# Patient Record
Sex: Female | Born: 1945 | Race: Black or African American | Hispanic: No | Smoking: Former smoker
Health system: Southern US, Community
[De-identification: ages and names within clinical notes are randomized; demographics above are authoritative.]

## PROBLEM LIST (undated history)

## (undated) DIAGNOSIS — J45909 Unspecified asthma, uncomplicated: Secondary | ICD-10-CM

## (undated) DIAGNOSIS — C50919 Malignant neoplasm of unspecified site of unspecified female breast: Secondary | ICD-10-CM

## (undated) DIAGNOSIS — E781 Pure hyperglyceridemia: Secondary | ICD-10-CM

## (undated) DIAGNOSIS — G43909 Migraine, unspecified, not intractable, without status migrainosus: Secondary | ICD-10-CM

## (undated) DIAGNOSIS — M1990 Unspecified osteoarthritis, unspecified site: Secondary | ICD-10-CM

## (undated) DIAGNOSIS — I10 Essential (primary) hypertension: Secondary | ICD-10-CM

## (undated) DIAGNOSIS — F419 Anxiety disorder, unspecified: Secondary | ICD-10-CM

## (undated) DIAGNOSIS — F329 Major depressive disorder, single episode, unspecified: Secondary | ICD-10-CM

## (undated) DIAGNOSIS — E119 Type 2 diabetes mellitus without complications: Secondary | ICD-10-CM

## (undated) DIAGNOSIS — F0390 Unspecified dementia without behavioral disturbance: Secondary | ICD-10-CM

## (undated) DIAGNOSIS — T7840XA Allergy, unspecified, initial encounter: Secondary | ICD-10-CM

## (undated) DIAGNOSIS — F32A Depression, unspecified: Secondary | ICD-10-CM

## (undated) HISTORY — PX: ROTATOR CUFF REPAIR: SHX139

## (undated) HISTORY — PX: JOINT REPLACEMENT: SHX530

## (undated) HISTORY — DX: Essential (primary) hypertension: I10

## (undated) HISTORY — DX: Unspecified asthma, uncomplicated: J45.909

## (undated) HISTORY — PX: COLONOSCOPY: SHX174

## (undated) HISTORY — DX: Allergy, unspecified, initial encounter: T78.40XA

## (undated) HISTORY — DX: Unspecified osteoarthritis, unspecified site: M19.90

## (undated) HISTORY — PX: TOTAL HIP ARTHROPLASTY: SHX124

## (undated) HISTORY — DX: Major depressive disorder, single episode, unspecified: F32.9

## (undated) HISTORY — DX: Pure hyperglyceridemia: E78.1

## (undated) HISTORY — PX: MASTECTOMY COMPLETE / SIMPLE W/ SENTINEL NODE BIOPSY: SUR846

## (undated) HISTORY — DX: Depression, unspecified: F32.A

## (undated) HISTORY — PX: BREAST SURGERY: SHX581

---
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
## 1971-07-06 HISTORY — PX: TUBAL LIGATION: SHX77

## 1979-03-06 DIAGNOSIS — G43909 Migraine, unspecified, not intractable, without status migrainosus: Secondary | ICD-10-CM

## 1979-03-06 HISTORY — DX: Migraine, unspecified, not intractable, without status migrainosus: G43.909

## 1979-07-06 HISTORY — PX: ABDOMINAL HYSTERECTOMY: SHX81

## 1998-06-12 ENCOUNTER — Other Ambulatory Visit: Admission: RE | Admit: 1998-06-12 | Discharge: 1998-06-12 | Payer: Self-pay | Admitting: Gynecology

## 1999-12-06 ENCOUNTER — Emergency Department (HOSPITAL_COMMUNITY): Admission: EM | Admit: 1999-12-06 | Discharge: 1999-12-06 | Payer: Self-pay | Admitting: Emergency Medicine

## 1999-12-06 ENCOUNTER — Encounter: Payer: Self-pay | Admitting: Emergency Medicine

## 2000-03-09 ENCOUNTER — Ambulatory Visit (HOSPITAL_BASED_OUTPATIENT_CLINIC_OR_DEPARTMENT_OTHER): Admission: RE | Admit: 2000-03-09 | Discharge: 2000-03-09 | Payer: Self-pay | Admitting: Orthopedic Surgery

## 2000-03-15 HISTORY — PX: ANKLE FRACTURE SURGERY: SHX122

## 2000-10-20 ENCOUNTER — Other Ambulatory Visit: Admission: RE | Admit: 2000-10-20 | Discharge: 2000-10-20 | Payer: Self-pay | Admitting: General Surgery

## 2000-10-20 ENCOUNTER — Encounter: Admission: RE | Admit: 2000-10-20 | Discharge: 2000-10-20 | Payer: Self-pay | Admitting: General Surgery

## 2000-10-20 ENCOUNTER — Encounter: Payer: Self-pay | Admitting: General Surgery

## 2000-10-20 ENCOUNTER — Encounter (INDEPENDENT_AMBULATORY_CARE_PROVIDER_SITE_OTHER): Payer: Self-pay | Admitting: Specialist

## 2001-10-08 ENCOUNTER — Emergency Department (HOSPITAL_COMMUNITY): Admission: EM | Admit: 2001-10-08 | Discharge: 2001-10-08 | Payer: Self-pay | Admitting: Emergency Medicine

## 2001-10-08 ENCOUNTER — Encounter: Payer: Self-pay | Admitting: Emergency Medicine

## 2003-03-15 ENCOUNTER — Inpatient Hospital Stay (HOSPITAL_COMMUNITY): Admission: EM | Admit: 2003-03-15 | Discharge: 2003-03-17 | Payer: Self-pay | Admitting: Emergency Medicine

## 2003-03-15 ENCOUNTER — Encounter: Payer: Self-pay | Admitting: Emergency Medicine

## 2003-03-27 ENCOUNTER — Encounter: Admission: RE | Admit: 2003-03-27 | Discharge: 2003-03-27 | Payer: Self-pay | Admitting: Family Medicine

## 2006-11-01 ENCOUNTER — Inpatient Hospital Stay (HOSPITAL_COMMUNITY): Admission: RE | Admit: 2006-11-01 | Discharge: 2006-11-05 | Payer: Self-pay | Admitting: Orthopedic Surgery

## 2007-03-14 ENCOUNTER — Ambulatory Visit: Payer: Self-pay | Admitting: Gastroenterology

## 2007-03-27 ENCOUNTER — Encounter: Payer: Self-pay | Admitting: Gastroenterology

## 2007-03-27 ENCOUNTER — Ambulatory Visit: Payer: Self-pay | Admitting: Gastroenterology

## 2007-08-18 ENCOUNTER — Inpatient Hospital Stay (HOSPITAL_COMMUNITY): Admission: RE | Admit: 2007-08-18 | Discharge: 2007-08-21 | Payer: Self-pay | Admitting: Orthopedic Surgery

## 2010-11-17 NOTE — H&P (Signed)
[name], [name]              ACCOUNT NO.:  [id number]   MEDICAL RECORD NO.:  [id number]          PATIENT TYPE:  INP   LOCATION:  NA                           FACILITY:  [facility]   PHYSICIAN:  Madlyn Frankel. Charlann Boxer, M.D.  DATE OF BIRTH:  [birth year]   DATE OF ADMISSION:  DATE OF DISCHARGE:                              HISTORY & PHYSICAL   CHIEF COMPLAINTS:  Right hip and groin pain.   HISTORY OF PRESENT ILLNESS:  A 65 year old female with a history of  right hip pain secondary to avascular necrosis with a history of left  total hip placement in April 2008.  She progressed nicely with a left  total hip replacement.  Her right hip has been refractory to all  conservative treatment including anti-inflammatories and intra-articular  injection.  She has been presurgically assessed previously by Dr.  Merla Riches for joint replacement surgery.   PAST MEDICAL HISTORY:  1. Degenerative joint disease with avascular necrosis.  2. Diabetes.  3. Hypertension.  4. Depression.  5. Asthma.   SOCIAL HISTORY:  The patient is single.  Primary caregiver will be  family.   FAMILY HISTORY:  Hypertension, diabetes, stroke, arthritis.   DRUG ALLERGIES:  No known drug allergies.   MEDICATIONS:  1. Glucophage 500 mg XR one p.o. daily.  2. Atenolol 100 mg one p.o. daily.  3. Amlodipine 10 mg one p.o. daily.  4. HCTZ 12.5 mg one p.o. daily.  5. Wellbutrin XL 300 mg one p.o. daily.  6. Xanax 0.5 mg one p.o. q.6 p.r.n.  7. Albuterol p.r.n. for asthma.   REVIEW OF SYSTEMS:  RESPIRATORY:  She has asthma and uses albuterol  rescue inhaler.  Otherwise see HPI.   PHYSICAL EXAMINATION:  Pulse 88, respirations 18, blood pressure 138/94.  GENERAL:  Awake, alert and oriented, well-developed, well-nourished, no  acute distress.  NECK:  Supple.  No carotid bruits.  CHEST:  She does have inspiratory wheeze upper lobes. Equal chest rise  and fall.  BREASTS:  Deferred.  HEART:  Regular rate and rhythm.  S1, S2  distinct.  ABDOMEN:  Soft, nontender, nondistended.  Bowel sounds present.  GENITOURINARY:  Deferred.  EXTREMITIES:  Right hip increased pain with internal range of motion.  SKIN:  No cellulitis right hip.  NEUROLOGIC:  Intact distal sensibilities.   LABS:  EKG, chest x-ray pending presurgical testing.   IMPRESSION:  Right hip avascular necrosis.   PLAN OF ACTION:  Right total hip arthroplasty August 18, 2007 [facility] by surgeon Dr. Durene Romans.  The risks and complications  were discussed.  Questions were encouraged answered and reviewed.   Postoperative medications including Lovenox, Robaxin, iron, aspirin,  Colace, MiraLax were provided at the time of history and physical.  Pain  medicine will be provided at the time of surgery.  Planned discharge is  home health care, PT and Lovenox teaching.     ______________________________  Yetta Glassman. [address], [place]      Madlyn Frankel. Charlann Boxer, M.D.  Electronically Signed    BLM/MEDQ  D:  08/10/2007  T:  08/11/2007  Job:  [id number]

## 2010-11-17 NOTE — Op Note (Signed)
[name], [name]              ACCOUNT NO.:  [id number]   MEDICAL RECORD NO.:  [id number]          PATIENT TYPE:  INP   LOCATION:  1608                         FACILITY:  [facility]   PHYSICIAN:  Madlyn Frankel. Charlann Boxer, M.D.  DATE OF BIRTH:  Apr 17, 1946   DATE OF PROCEDURE:  08/18/2007  DATE OF DISCHARGE:                               OPERATIVE REPORT   PREOPERATIVE DIAGNOSIS:  Right hip osteoarthritis.   POSTOPERATIVE DIAGNOSIS:  Right hip osteoarthritis.   PROCEDURE:  Right total hip replacement.   COMPONENTS USED:  DePuy hip system, size 50 Pinnacle cup, with a 36  neutral metal liner, a size 4 high-offset Trilock stem, with a 36 +1.5  ball.   SURGEON:  Madlyn Frankel. Charlann Boxer, M.D.   ASSISTANT:  Yetta Glassman. Mann, PA.   ANESTHESIA:  General.   BLOOD LOSS:  250.   DRAINS:  x1.   COMPLICATIONS:  None.   INDICATIONS FOR PROCEDURE:  [name] is a pleasant 65 year old patient  of mine with a history of left total hip replacement, who came in with  progressive right hip pain, with radiographic findings of end-stage  change.  She had progressive discomfort, and based on response to the  left total hip replacement she wished to proceed with right hip surgery.  I reviewed the risks and benefits with her, so as obtaining consent.   PROCEDURE IN DETAIL:  The patient was brought to the operative theater.  Once adequate anesthesia and preoperative antibiotics, Ancef, were  administered, the patient was positioned in the left lateral decubitus  position with the right side up.  Prior to positioning, in the supine  position I evaluated her leg lengths, feeling and assessing that the  left lower extremity, the previously operated hip, was about 1 cm  longer.  I made noted of this when the patient was positioned in the  left lateral decubitus position and determined the leg length position  based on the position of the patella down to the down leg.  At this  point, we prepped out the perineum,  then prescrubbed and prepped out the  hip in the normal standard fashion.  A lateral-based incision was made  for a posterior approach to the hip.  The iliotibial band and gluteus  fascia were incised posteriorly.  The short external rotators were  identified and taken down separately from the posterior capsule.  An L  capsulotomy was made, preserving the posterior superior leaflet for  later anatomical repair and utilizing the posterior leaflet to protect  the sciatic nerve from retractors.  The hip was then dislocated, and the  neck osteotomy made based off anatomic landmarks.   I then began broaching the femur.  I used a box osteotome and then the  drill to open up the canal, then did a hand reamer and irrigated the  canal to prevent fat emboli.  I then began broaching and carried it  initially up to a size 3 to the level the neck cut, planing it off with  the calcar planer.  At this point, I attended to the acetabulum, with  acetabular  exposure routine, including labrectomy.  I began reaming with  a 43 reamer.  I reamed up to a 49 reamer, with good punctate bleeding  bone in the acetabulum.  I thus impacted a 15 mm Pinnacle cup.  This sat  well within the prepared bone.  A single cancellous screw was placed.  Cup was positioned a 35-40 degrees of abduction, 20 degrees of forward  flexion.  Trial reduction was then carried out initially with a size 3  femur and a 1.5 ball.  Based on the amount of shuck and extension of  position compared to the down leg,  I felt that the leg was short.  I  then placed a size 4 broach.  This sat a few millimeters proud to the  neck cut.  Trial reduction revealed much more appropriate leg lengths  compared to the down leg, with about 1 mm shuck, indicating good soft  tissue tension.  At this point, I removed the trials, placed a final 36  neutral metal liner, and placed a 4 high-offset Trilock stem which sat  the level of the broach head.  I did then  even a retrial with a 36/1.5  ball to make sure that everything remained stable, and it was, and leg  lengths appeared equal, now that we added a little bit of length to this  right side.  At this point, the final 36/1.5 ball was impacted on a  clean and dry trunnion.  Hip was irrigated throughout the case, and  again at this point the hip was reduced and the posterior capsule  leaflet reapproximated to the superior leaflet using #1 Ethibond.  A  medium Hemovac drain was placed deep.  The iliotibial band was  reapproximation using #1 Ethibond, and #1 Vicryl was run in the gluteal  fascia.  The remainder of the wound was closed with 2-0 Vicryl and a  running 4-0 Monocryl.  The hip was then cleaned, dried, and dressed  sterilely with Steri-Strips and a sterile wrap.  She was brought to the  recovery room, extubated, in stable condition.      Madlyn Frankel Charlann Boxer, M.D.  Electronically Signed     MDO/MEDQ  D:  08/18/2007  T:  08/21/2007  Job:  [id number]

## 2010-11-20 NOTE — H&P (Signed)
NAMEJANNELLE, [name]                        ACCOUNT NO.:  [id number]   MEDICAL RECORD NO.:  [id number]                   PATIENT TYPE:  INP   LOCATION:  5731                                 FACILITY:  [facility]   PHYSICIAN:  Pearlean Brownie, M.D.            DATE OF BIRTH:  [birth year]   DATE OF ADMISSION:  03/15/2003  DATE OF DISCHARGE:                                HISTORY & PHYSICAL   PRIMARY CARE PHYSICIAN:  Dr. Merla Riches, [facility].   CHIEF COMPLAINT:  Dehydration and vomiting.   HISTORY OF PRESENT ILLNESS:  [name] is a 65 year old, African-American  female with past medical history significant for hypertension, obesity,  diabetes mellitus type 2, diet-controlled, presenting with a five-day  history of vomiting, dizziness and cough.  The patient complains of a five-  day history of frequent vomiting which is nonbilious and nonbloody with  approximately six to eight episodes of vomiting per day.  The patient did  present to [facility] on the day prior to presentation secondary to  persistent vomiting and dizziness and received an antiemetic as well as two  bags of fluid at that time and was discharged home.  The patient has not had  further vomiting since receiving the antiemetics at [facility] the day  prior.  The patient is also complaining of significant dizziness for the  past four to five days as well.  When the patient initially presented to  [facility] on the day prior to presentation, her blood pressure was  78/40.  The patient has received two bags of IV fluids with improvement in  blood pressure at that time.  The patient presented to [facility] again  on the day of presentation secondary to persistent dizziness and blood  pressure was appreciated to be in the 80s/50s.  The patient was sent from  [facility] for admission secondary to persistent hypertension despite  outpatient management.  She has had no diarrhea, positive chills,  however,  no objective fever.  She has had positive persistent nausea, positive  abdominal pain only with coughing and vomiting, positive soreness in  shoulders and chest especially with movement, positive cough with yellow  sputum production for the past four days, positive shortness of breath with  intermittent wheezing.  The patient does have an albuterol inhaler and she  uses that intermittent at home and did receive two nebulizers at [facility] with improvement in symptoms.  She has positive nasal congestion, no  sore throat, no sick contacts, headache in frontal region for the past  several days.  The patient is not taking medication at home for headache,  however, did receive Toradol at [facility] with significant improvement  in symptoms.  She has decreased p.o. intake and the patient's last meal was  the night prior to presentation.  No p.o. intake today except for some  water.   PAST MEDICAL HISTORY:  1. Hypertension since  2003.  2. Obesity.  3. Diabetes mellitus, type 2/hyperglycemia with hemoglobin A1C of 8.0 in     August 2003.  4. Tobacco abuse.  The patient quit smoking in May 2004.  5. Osteoarthritis of hips and knees.  6. Childhood asthma/bronchospasms.  7. G1, P1-0-0-1, status post normal spontaneous vaginal delivery x1.   PAST SURGICAL HISTORY:  1. Left ankle ORIF in 2001.  2. Partial hysterectomy secondary to fibroids when she was 34.  3. Status post BTL.   ALLERGIES:  No known drug allergies.   MEDICATIONS:  1. Vioxx 25 mg p.o. q.d.  2. Wellbutrin, unknown dose, q.d. for smoking cessation.  3. Lisinopril 20 mg p.o. q.d.  4. Albuterol MDI p.r.n. wheezing and shortness of breath.  5. Calcium supplementation p.o. q.d.   SOCIAL HISTORY:  The patient is divorced and has one daughter who is in the  area, however, the patient does not know of her location currently.  The  patient works full time at [employer] and she is currently raising her four   grandchildren who are 80, 76, 29 and 65 years old.  The patient quit smoking  this year after smoking one pack per day x40 years.  The patient drank  alcohol socially.  No drugs currently, however, does have a history of  marijuana use.   FAMILY HISTORY:  Mother died at age 18, secondary to a CVA.  She also had  hypertension, diabetes and asthma.  Father died of an MVA when the patient  was 33 years old.  Sister with a CVA at the age of 16.  She also has  hypertension and is status post hip replacement.  Brother who is 15 years  old with hypertension who has undergone hip replacement as well.  Brother  who died of lung cancer and he also had diabetes.   REVIEW OF SYMPTOMS:  CONSTITUTIONAL:  No rash or weight changes.  Normal  energy level except with acute illness.  RESPIRATORY:  Positive wheezing, no  hemoptysis.  GENITOURINARY:  No dysuria, urgency or frequency.  Decreased  urine output for the past two to three days.  No bloody stools, no melena,  no hematemesis.  No vaginal discharge.  MUSCULOSKELETAL:  No back pain.  No  focal weakness.  NEUROLOGIC:  No ataxia.   PHYSICAL EXAMINATION:  VITAL SIGNS:  Temperature 98.3, pulse 86,  respirations 20, blood pressure 118/61, O2 saturations 98% on room air.  Repeat vital signs with temperature 98.7, pulse 74, respirations 22, blood  pressure 117/57.  GENERAL:  Well-developed, obese female in no acute distress.  She is alert  and oriented x3.  HEENT:  Pupils equal round and reactive to light.  Sclerae nonicteric.  Mucous membranes are moist.  Oropharynx without erythema or exudate.  Tympanic membranes are pearly gray, no sinus tenderness appreciated.  CARDIAC:  Regular rate and rhythm, normal S1, S2.  Distant heart sounds, no  murmurs, rubs or gallops.  LUNGS:  Good air movement.  No wheezing, rales or rhonchi.  No tachypnea, no  respiratory distress appreciated. ABDOMEN:  Obese, nondistended, positive bowel sounds, normoactive, no  masses,  no hepatosplenomegaly.  The patient is diffusely tender throughout,  however, no guarding or rebound.  EXTREMITIES:  No clubbing, cyanosis or edema.  SKIN:  Warm and dry.  There are no rashes or lesions appreciated.  NEUROLOGIC:  Cranial nerves 2-12 grossly intact.  Motor 5/5 throughout.  Cerebellar function is intact with finger-to-nose testing.  Sensation is grossly intact.  LABORATORY DATA AND X-RAY FINDINGS:  On September 9, EKG revealed normal  sinus rhythm with no acute abnormality.  Repeat EKG on September 10,  revealed normal sinus rhythm with no ST abnormality and no changes.  Chest x-  ray performed at [facility] revealed no active disease.  CBC, CMET,  amylase, lipase and urinalysis are pending currently.   ASSESSMENT/PLAN:  This is a 65 year old, African-American female presenting  with several day history of cough, nausea, vomiting, dizziness who has  failed outpatient management.  1. Dehydration due to decreased intake by mouth and persistent vomiting.  We     will rehydrate overnight with intravenous fluids and slowly advance diet     as tolerated.  Will check orthostatics as well as urine.  Will hold     antihypertensives while the patient remains borderline low blood     pressure.  Will restart blood pressure medicines prior to discharge.  2. Nausea and vomiting.  Differential diagnoses include infectious etiology,     cholecystitis, small bowel obstruction, pancreatitis, acute myocardial     infarction in a diabetic patient.  Check white blood cell count.  Check     liver function tests.  Check amylase and lipase.  Will check a kidney,     ureter and bladder scan as well as abdominal ultrasound.  Will check     cardiac enzymes and electrocardiogram.  Will administer antiemetics     during hospitalization as well.  If diarrhea develops, will check a stool     culture.  3. Cough.  Differential diagnoses include viral syndrome, bronchitis and     pneumonia.  The  patient is currently afebrile without evidence of     respiratory distress.  Chest x-ray is without evidence of infiltrate.     Will put her on antibiotic therapy for acute bronchitis considering her     tobacco history.  If respiratory status were to decline, I would     recommend repeating a chest x-ray after the patient is hydrated.  Would     administer albuterol for underlying chronic obstructive pulmonary     disease.  4. Cardiovascular with hypotension due to dehydration.  We will hold     lisinopril while rehydrating the patient.  Will rule out acute myocardial     infarction with nausea and vomiting considering the patient's history of     diabetes.  Will repeat electrocardiogram.  The patient's cardiac risk     factors including hypertension, diabetes mellitus type 2, tobacco history     and family history; however, I feel that the patient's current    presentation is more consistent with infectious etiology other than     cardiac.  5. Diabetes mellitus type 2, diet-controlled.  The patient with a hemoglobin     A1C in 2003, of 8.0.  The patient is currently not on any medications and     is apparently diet-controlled.  Will check fasting blood glucoses while     in house.  The patient has a strong family history of type 2 diabetes.  6. Osteoarthritis.  Will continue Vioxx therapy while inpatient.  7. We will admit the patient for 23-hour observation to anticipate a short     hospitalization.      Nilda Simmer, M.D.                        Pearlean Brownie, M.D.    KS/MEDQ  D:  03/15/2003  T:  03/16/2003  Job:  [id number]

## 2010-11-20 NOTE — H&P (Signed)
[name], [name]              ACCOUNT NO.:  [id number]   MEDICAL RECORD NO.:  [id number]          PATIENT TYPE:  INP   LOCATION:  NA                           FACILITY:  [facility]   PHYSICIAN:  Madlyn Frankel. Charlann Boxer, M.D.  DATE OF BIRTH:  Aug 24, 1945   DATE OF ADMISSION:  DATE OF DISCHARGE:                              HISTORY & PHYSICAL   PROCEDURE:  Left total hip replacement.   CHIEF COMPLAINT:  Left hip pain.   HISTORY OF PRESENT ILLNESS:  This is a 65 year old female with a history  persistent progressive left hip pain secondary avascular necrosis.  She  has been refractory to all conservative treatments.  She was cleared  presurgically by Dr. Merla Riches.   PAST MEDICAL HISTORY:  1. Degenerative joint disease with avascular necrosis.  2. Diabetes.  3. Hypertension.  4. Depression.  5. Asthma.   FAMILY HISTORY:  The patient is single.  Primary caregiver after surgery  will be a granddaughter and friend.   FAMILY HISTORY:  Hypertension, diabetes, stroke, arthritis.   DRUG ALLERGIES:  No known drug allergies.   MEDICATIONS:  1. Glucophage 500 XR 1 daily.  2. Lisinopril 10/12.5 one daily.  3. Wellbutrin 300 mg XL 1 daily.  4. Xanax 0.5 mg 1 b.i.d.  5. Albuterol p.r.n. for asthma.   REVIEW OF SYSTEMS:  RESPIRATORY:  She has asthma and does use an  albuterol rescue inhaler.  None other than HPI.   PHYSICAL EXAMINATION:  Pulse 86, respirations 18, blood pressure 138/88.  GENERAL:  She is awake, alert and oriented, well-developed, well-  nourished in no acute stress.  NECK:  Supple with no carotid bruits.  CHEST:  Lungs with expiratory wheeze.  BREASTS:  Deferred.  HEART:  Regular rate and rhythm without gallops, clicks, rubs or  murmurs.  ABDOMEN:  Soft, nontender.  Bowel sounds present.  GENITOURINARY:  Deferred.  EXTREMITIES:  Painful to range of motion; antalgic gait.  SKIN:  Intact with no signs of cellulitis.  NEUROLOGIC:  Intact distal sensibilities.   LABORATORY  DATA:  EKG, chest x-ray are all pending presurgical testing.   IMPRESSION:  Left hip avascular necrosis with degenerative joint  disease.   PLAN OF ACTION:  Left total hip arthroplasty by surgeon, Dr. Durene Romans.  Risks and complications were discussed.  Questions were  encouraged, answered and reviewed.     ______________________________  Yetta Glassman [address], [place]      Madlyn Frankel. Charlann Boxer, M.D.  Electronically Signed    BLM/MEDQ  D:  10/21/2006  T:  10/21/2006  Job:  [id number]   cc:   Merla Riches, M.D.

## 2010-11-20 NOTE — Discharge Summary (Signed)
[name], [name]                        ACCOUNT NO.:  [id number]   MEDICAL RECORD NO.:  [id number]                   PATIENT TYPE:  INP   LOCATION:  5731                                 FACILITY:  [facility]   PHYSICIAN:  Nilda Simmer, M.D.                  DATE OF BIRTH:  June 28, 1946   DATE OF ADMISSION:  03/15/2003  DATE OF DISCHARGE:  03/17/2003                                 DISCHARGE SUMMARY   CONSULTATIONS:  None.   PROCEDURES:  1. Abdominal ultrasound.  2. Abdominal x-ray.   DISCHARGE DIAGNOSES:  1. Dehydration secondary to vomiting.  2. Hypotension secondary to dehydration.  3. Gastroenteritis, viral.  4. Acute bronchitis.  5. History of tobacco abuse.  6. Hypertension.  7. Obesity.  8. Diabetes mellitus, type 2, diet controlled.  9. Osteoarthritis.  10.      Chronic obstructive pulmonary disease, asthma.   DISCHARGE MEDICATIONS:  1. Vioxx 25 mg one p.o. every day  2. Lisinopril 20 mg p.o. daily.  3. Albuterol MDI with spacer, two puffs q.4-6h. p.r.n. wheezing.  4. Wellbutrin taken as described.  5. Calcium, continue previous dose.   FOLLOWUP:  The patient is recommended to contact [facility] for a  followup appointment with Dr. Reed Breech in the upcoming one week.   HOSPITAL COURSE:  The patient is a 65 year old African American female with  a past medical history significant for hypertension, obesity, diabetes  mellitus, presenting with a five-day history of vomiting, dizziness and  cough.  The patient had several episodes of non-bilious, non-bloody vomiting  for several days prior to presentation.  She presented to [facility] on  the day prior to presentation secondary to significant dizziness and was  found to have a blood pressure of 80/40.  The patient received [facility] at  [facility] with improvement of her blood pressure.  The patient presented  for followup at [facility] on the day of presentation with a blood  pressure persistently in the  80's/50's.  The patient was sent for admission  secondary to persistent vomiting and persistent hypotension.  The patient  was admitted for rehydration.  Blood pressure medications were held.  Lab  work was performed as well as an abdominal ultrasound and KUB to rule out  contributing etiologies.  The patient was also placed on telemetry and ruled  out for acute myocardial infarction considering her diabetes mellitus and a  possible atypical presentation of a cardiac event.  Cardiac enzymes were  stable and EKG was stable during hospitalization.  Abdominal ultrasound was  within normal limits as well as abdominal film.  The patient clinically  improved over two days of hospitalization and was tolerated p.o.  Blood  pressure had normalized on the day of discharge.  Blood pressure medications  were started on discharge.  The patient was clinically stable.   PROBLEM LIST:  1. Nausea and vomiting.  Consistent with  a viral gastroenteritis.  The     patient was admitted secondary to dehydration due to gastroenteritis.     The patient also received antiemetics to help treat the problem.  The     patient clinically improved over the two days of hospitalization.  She     was tolerating p.o. after two days of hospitalization.  2. Hypotension secondary to vomiting and continued blood pressure     medication.  The blood pressure medication was held upon admission.  The     patient received IV hydration during hospitalization.  Blood pressure     normalized.  His vomiting improved as well.  3. Dehydration due to persistent vomiting due to acute illness.  The patient     received IV fluids during hospitalization that also helped her improve..  4. Diabetes mellitus type 2, diet controlled.  Blood sugars remained stable     during hospitalization.  Hemoglobin A1c was 7.  The patient will follow     up at [facility] for continued care.  5. Hypertension.  The patient was normally hypertensive and on  Lisinopril     therapy chronically.  Lisinopril was held initially; however, it was     restarted prior to discharge for blood pressure control.   DISCHARGE LABORATORY DATA:  EKG revealed normal sinus rhythm with normal PR  and QRS intervals and no acute abnormalities.  Abdominal ultrasound revealed  cholelithiasis without evidence of cholecystitis or bowel duct obstruction.  There was a single 9 mm calculus noted in the gallbladder.  KUB of the  abdomen revealed no active abdominal process.  There was a left  calcification of unknown significance.  White blood cell is 5.2, hemoglobin  is 11.2, hematocrit 32.6, platelets 279.  Sodium 142, potassium 4.0,  chloride 110, pO2 26.  Glucose 126, BUN 26, creatinine 1.3.  Calcium 8.2.  AST 25.  ALT 27.  Alkaline phosphatase 40.  Total bilirubin 0.5.  Amylase  164, lipase 56, hemoglobin A1c 7.0.   CK at 156, and 161.  CK-MB of 0.7 and 0.8.  Index was 0.4 and 0.5.  Troponin  I is 0.01 and 0.02.  Blood cultures negative x2.  Urine culture negative.  Stool culture negative.   DISCHARGE INSTRUCTIONS:  1. Pain management not applicable.  2. Activity, no restrictions.  3. Diet:  Recommend low-fat, low-sugar diet.  4. Wound care, not applicable.   SPECIAL INSTRUCTIONS:  The patient is to contact [facility] for the  development of recurrent vomiting, chest pain, shortness of breath and  dizziness.                                                Nilda Simmer, M.D.    KS/MEDQ  D:  06/07/2003  T:  06/08/2003  Job:  [id number]   cc:   [facility]

## 2010-11-20 NOTE — Op Note (Signed)
NAMECOURNEY, GARROD              ACCOUNT NO.:  [id number]   MEDICAL RECORD NO.:  [id number]          PATIENT TYPE:  INP   LOCATION:  0004                         [facility]ACILITY:  [facility]   PHYSICIAN:  Madlyn Frankel. Charlann Boxer, M.D.  DATE OF BIRTH:  1946/01/16   DATE OF PROCEDURE:  11/01/2006  DATE OF DISCHARGE:                               OPERATIVE REPORT   PREOPERATIVE DIAGNOSIS:  Left hip degenerative joint disease and  underlying avascular changes based on radiographs.   POSTOPERATIVE DIAGNOSIS:  Left hip degenerative joint disease and  underlying avascular changes based on radiographs.   OPERATION/PROCEDURE:  Left total hip replacement.   COMPONENTS USED:  DePuy hip system, size 52 pinnacle cup, 36 neutral  metal-on-metal liner, a TriLoc size 10 lateralized offset stem with 36  1.5 ball.   SURGEON:  Madlyn Frankel. Charlann Boxer, M.D.   ASSISTANT:  Jamelle Rushing, PA-C.   ANESTHESIA:  General.   BLOOD LOSS:  200 mL.   DRAINS:  None.   COMPLICATIONS:  None.   INDICATIONS:  Mrs. Halle is a 65 year old female evaluated for  bilateral hip pain, left greater than right.  Radiographically, she had  changes of degenerative change with cystic change in both femoral and  acetabular side.  She failed to respond to conservative measures and  based on decreased quality of life and wishing to remain active, she  wished to pursue the hip arthroplasty.  We reviewed the risks of  dislocation, DVT, infection,  history of her diabetes, component  failure, need for revision surgery.  Consent was obtained.   PROCEDURE IN DETAIL:  The patient was brought to operative theater.  Once adequate anesthesia and preoperative antibiotics, 2 grams of Ancef,  were administered, the patient was positioned in the right lateral  decubitus position with the left side up.  Left lower extremity was then  prepped and draped in sterile fashion following prescrub.  Lateral based  incision was made for posterior approach to the  hip.  Iliotibial band  and gluteal fascia were incised in line with incision.  The short  external rotators were identified and taken down separate from posterior  capsule where the L capsulotomy was made to help preserve the capsule  for later anatomic repair.   The posterior capsule was also used to protect the sciatic nerve from  retractors.  Hip was dislocated and based on anatomic landmarks and  preoperative templating the neck, osteotomy was made based off the  center head in the tip of trochanter.   Attention was first directed to the femur.  Femoral exposure obtained.  Box osteotome was used to set anteversion at 20 degrees.   I then used a hand reamer followed by irrigation to prevent fat emboli.  Began broaching with the 75 broach all way up to size 10 which had an  excellent fit at the neck cut.   This femur was packed with a sponge to prevent any oozing and attention  was directed the acetabulum.  Following retractor placement, labrectomy  was carried out.  I then began reaming with a 45 reamer.  I carried  this  up all the way to 51 reamer with an excellent bony bed preparation and  good coverage.  The final 52 pinnacle cup was then impacted with  approximately 20 degrees of forward flexion.  This was based off the hip  guide at 40 degrees of abduction and beneath the anterior wall  anteriorly, it appeared to be anatomically positioned.   At this point two cancellous bone screws were felt to provide some  initial stability to the cup which was well seated.  Trial liner was  placed.  The trial femur was then placed with a lateralized offset neck  and a 36 1.5 bal.  Hip stability was excellent with about a millimeter  of shuck.  Her leg lengths appeared to be equal to that when compared to  the down leg when I had her positioned prior to prepping. Given all  these parameters, I chose these as the final components.  Trial  components removed.  The final hole eliminator was  placed followed by a  36 metal liner.  The final TriLoc size 10 lateralized offset stem was  impacted to the level of the neck cut.  There was with perhaps a  millimeter higher so I tried a minus 2 ball initially.  There was a  couple millimeters of shuck and given this I chose the +1.5 ball.   She does have some degenerative changes in the right hip.  It does not  bother her as much in the left.  She very well may need a hip  replacement on the right side so if there is any leg length discrepancy  of a couple millimeters, I can try to match the other side.  However, I  do not think, based on the observation in the operating room, that that  is going to be a problem.   The final 36 +1.5 was impacted onto a clean and dry trunnion and the hip  reduced.  The hip was irrigated.  The posterior capsule then  reapproximated to the superior leaflet using #1 Ethibond.  The  iliotibial band was reapproximated using #1 Ethilon and #1 Vicryl in the  gluteal fascia.  Note FloSeal was used in this case die the patient the  fact that there is a very good hemostasis throughout the case.   The remainder wound was closed 2-0 Vicryl and a running 4-0 Monocryl.  She was brought to recovery room in sterile bulky sterile dressing on  her hip.      Madlyn Frankel Charlann Boxer, M.D.  Electronically Signed     MDO/MEDQ  D:  11/01/2006  T:  11/01/2006  Job:  [id number]

## 2010-11-20 NOTE — Op Note (Signed)
[facility]  Patient:    [name], [name]                     MRN: [id number] Proc. Date: 03/09/00 Adm. Date:  [id number] Attending:  Colbert Ewing                           Operative Report  PREOPERATIVE DIAGNOSIS:  Delayed nonunion, lateral malleolus -- left ankle. With mild displacement.  POSTOPERATIVE DIAGNOSIS:  Delayed nonunion, lateral malleolus -- left ankle. With mild displacement.  OPERATIVE PROCEDURE:  Open reduction internal fixation, lateral malleolus fracture -- left ankle; with a 5-hole 1/3 tubular titanium plate and screws.  SURGEON:  Loreta Ave, M.D.  ASSISTANT:  Arlys John D. Petrarca, P.A.-C.  ANESTHESIA:  General.  BLOOD LOSS:  Minimal.  TOURNIQUET TIME:  30 min.  SPECIMENS:  None.  CULTURES:  None.  COMPLICATIONS:  None.  DRESSINGS:  Soft compressive with cam walker.  DESCRIPTION OF PROCEDURE:  The patient went to the operating room and placed on the operating table in supine position.  After adequate anesthesia had been obtained, tourniquet applied about the upper aspect of the left leg.  She was prepped and draped in the usual sterile fashion.  Exsanguinated with elevation and a small tourniquet inflated to 350 mmHg.  Fluoroscopy unit used for guidance.  The fractures were identified, beginning just at the level of the mortise with the fragment displaced posterolaterally (2-3 mm).  No mortimedial instability.  Approach was a longitudinal incision posterolateral.  The fracture was identified and exposed subperiosteally.  This was curetted at the fracture margin and then freed up and reduced to an anatomic position.  It was affixed with a 5-hole plate with two screws distal and three screws prosimal. This yielded exacting anatomic reduction and solid internal fixation by this method.  Care was taken to avoid entrance into the joint, and ______ with the remaining screws.  Once we had confirmation of the  excellent position and stability, it was assessed fluoroscopy with good motion, good stability and alignment of the fracture and the mortise.  The wound was irrigated and then closed with subcutaneous subcuticular Vicryl and Steri-Strips.  Margins of the wound were injected with Marcaine.  Sterile compressive dressing was applied with cam walker.  The tourniquet deflated and removed.  Anesthesia was reversed and patient brought to the recovery room.  She tolerated the procedure well and no complications. DD:  03/09/00 TD:  03/10/00 Job: [id number]

## 2010-11-20 NOTE — Discharge Summary (Signed)
[name], [name]              ACCOUNT NO.:  [id number]   MEDICAL RECORD NO.:  [id number]          PATIENT TYPE:  INP   LOCATION:  1606                         FACILITY:  [facility]   PHYSICIAN:  Madlyn Frankel. Charlann Boxer, M.D.  DATE OF BIRTH:  [birth year]   DATE OF ADMISSION:  11/01/2006  DATE OF DISCHARGE:  11/05/2006                               DISCHARGE SUMMARY   ADMISSION DIAGNOSES:  1. Avascular necrosis left hip.  2. Osteoarthritis.  3. Diabetes.  4. Hypertension.  5. Depression.  6. Asthma.   DISCHARGE DIAGNOSIS:  1. Avascular necrosis.  2. Osteoarthritis.  3. Diabetes.  4. Hypertension.  5. Depression.  6. Asthma.   CONSULTATION:  None.   PROCEDURE:  Left total hip placement.  Surgeon Dr. Durene Romans,  assistant Jamelle Rushing, P.A.   COMPONENTS:  Metal on metal.   HISTORY OF PRESENT ILLNESS:  A 65 year old female with a history  persistent progressive left hip pain secondary to avascular necrosis and  degenerative joint disease.  Refractory all conservative treatments,  cleared presurgically by Dr. Merla Riches.   LABS:  Presurgical CBC; hematocrit 40.  Postop day #1 30.8.  Postop day  #2 29.2.  Preadmission coags normal.  Routine chemistries preoperative  glucose 126.  Postop day #1 sodium 133, glucose 142, creatinine 1.23.  Postop day #2, glucose 187, all other normal.  GFR preoperatively GFR  was 49.  Postop day #1 44.  Postop day #2 was greater than 60.  GI  chemistries albumin 3.2.  UA cloudy, otherwise negative.  Type and cross  O+.   EKG normal sinus rhythm.   RADIOLOGY:  1. Chest two-view; no active cardiopulmonary disease.  2. Portable pelvis status post left hip arthroplasty good position and      alignment in one-view.   HOSPITAL COURSE:  The patient underwent left total hip placement,  tolerated procedure well and was admitted to orthopedic floor.  On  postoperative day #1 she was doing well.  She was afebrile and left hip  had some scant bloody  drainage.  Motor sensory is intact distally. PT  was begun weightbearing as tolerated, Lovenox for DVT prophylaxis with  plan for discharge on Monday.  Initial PT OT was planned for home  health.  Postop day #2 doing well, was sore, difficult getting  comfortable and getting from chair to bedside commode, dressing was  changed and there was no active drainage.  She was neuromuscular  vascular intact. Left lower extremity continued to be weightbearing as  tolerated with DC plan Friday versus Saturday.  OT recommended the home  health versus SNF.  Postop day #3 progress was slow, she had walked up  to 100 feet, left hip was dry.  Neurovascular intact.  PT OT with plan  for home health.  Home Care was selected. Postop day #4 was doing well,  postop care was doing well and stable.  Radiographs revealed no  complications.  She was afebrile, wound was dry, neurovascularly stable,  ready for discharge home Lovenox.   DISCHARGE DISPOSITION:  Discharged home with home health care PT and  Lovenox  administration.   DIET:  Regular.   WOUND CARE:  Keep wound dry, change stressing on a daily basis.   DISCHARGE ACTIVITY:  She is weightbearing as tolerated with rolling  walker.   DISCHARGE MEDICATIONS:  1. Lisinopril 10/12.5 one p.o. q.a.m.  2. Albuterol two inhalations p.r.n.  3. Wellbutrin XL 300 mg one p.o. q.a.m.  4. Xanax 0.5 mg one p.o. b.i.d.  5. Glucophage 500 mg one p.o. q.a.m.  6. Vicodin 5/325 one to two p.o. q.4-6 p.r.n. pain.  7. Robaxin 500 mg one p.o. q.6 p.r.n. muscle spasm pain.  8. Colace 1 mg p.o. b.i.d. constipation.  9. Iron 325 one p.o. t.i.d. x3 weeks.  10.Lovenox 40 mg one subcu q. 24 x11 days.   DISCHARGE FOLLOW-UP:  1. With Dr. Charlann Boxer 3900 in 10 days.  Wound care check.  2. If she develops any acute shortness of breath or severe calf pain      call emergency service immediately.     ______________________________  [name], [place]      Madlyn Frankel. Charlann Boxer,  M.D.  Electronically Signed    BLM/MEDQ  D:  11/23/2006  T:  11/23/2006  Job:  [id number]

## 2010-11-20 NOTE — Discharge Summary (Signed)
[name], [name]              ACCOUNT NO.:  [id number]   MEDICAL RECORD NO.:  [id number]          PATIENT TYPE:  INP   LOCATION:  1608                         FACILITY:  [facility]   PHYSICIAN:  Madlyn Frankel. Charlann Boxer, M.D.  DATE OF BIRTH:  1945-12-24   DATE OF ADMISSION:  08/18/2007  DATE OF DISCHARGE:  08/21/2007                               DISCHARGE SUMMARY   ADMITTING DIAGNOSES:  1. Degenerative joint disease with avascular necrosis.  2. Hypertension.  3. Depression.  4. Asthma.   DISCHARGE DIAGNOSES:  1. Degenerative joint disease with avascular necrosis.  2. Diabetes.  3. Hypertension.  4. Depression.  5. Asthma.   HISTORY OF PRESENT ILLNESS:  A 65 year old female with a history of  right hip pain secondary to avascular necrosis with history of left hip  replacement in April 2008.   CONSULTANTS:  None.   PROCEDURES:  Right total hip replacement by surgeon Dr. Durene Romans,  assistant Dwyane Luo [facility].  Components were metal on metal.   LABORATORY DATA:  Preadmission CBC:  Hematocrit 38.9, platelets 454.  At  discharge hematocrit 28.8, platelets 290, white cell differential  normal.  Coags normal.  Routine chemistry:  Glucose was elevated at 125;  all others within normal limits at discharge.  Glucose was still  elevated at 144; all others are normal.  GFR 57.  Calcium 8.5 at  discharge.  UA was negative.   RADIOLOGY:  Chest, two views, showed no active cardiopulmonary disease.  Pelvic showed stable right hip prosthesis.   Cardiology:  Normal sinus rhythm.   HOSPITAL COURSE:  The patient underwent right total hip replacement and  tolerated the procedure well.  Has been to orthopedic floor.  On postop  day #1 was doing very well.  On postop day #1 afebrile, vital signs  stable.  Calf was soft and stable, afebrile.  Will DC the PCA and  Hemovac was removed.   Postop day #2 doing well, afebrile, hemodynamically stable.  Continued  to make progress with physical therapy.   We heplocked the IV.   Day #3 adequate progress with physical therapy, no complaints, afebrile.  Hemovac portal did have a little bit of serosanguineous ooze with  palpation, but, otherwise, no significant oozing, neurovascularly  intact.  Calves soft, nontender, weightbearing as tolerated, ready for  discharge.   DISCHARGE DISPOSITION:  Discharged home with home health care PT and  Lovenox teaching.   DISCHARGE WOUND CARE:  Keep dry.   DISCHARGE DIET:  Regular.   DISCHARGE PHYSICAL THERAPY:  Weightbearing as tolerated.  Will use  rolling walker.   DISCHARGE MEDICATIONS:  1. Lovenox 40 mg subcu q.24 x11 days.  2. Robaxin 500 mg p.o. q.6.  3. Iron 325 mg one p.o. t.i.d.  4. Enteric-coated aspirin 325 mg one p.o. daily after Lovenox.  5. Cholestyramine 300 p.o. b.i.d.  6. MiraLax 75 mg p.o. daily.  7. Vicodin 5/325 1-2 p.o. q.4-6 p.r.n. pain.   HOME MEDICATIONS:  1. Glucophage 500 mg p.o. every other day.  2. Wellbutrin XL 300 mg p.o. q.a.m.  3. Xanax 0.5 mg one p.o. b.i.d.  p.r.n.  4. Atenolol 100 mg one p.o. q.a.m.  5. HCTZ 12.5 mg one p.o. q.a.m.  6. Albuterol p.r.n.   DISCHARGE SPECIAL INSTRUCTIONS:  Follow up with Dr. Charlann Boxer at [phone] in  10-14 days.     ______________________________  [name], [name]      Madlyn Frankel. Charlann Boxer, M.D.  Electronically Signed    BLM/MEDQ  D:  10/03/2007  T:  10/03/2007  Job:  [id number]

## 2011-03-26 LAB — DIFFERENTIAL
Basophils Absolute: 0
Eosinophils Relative: 6 — ABNORMAL HIGH
Lymphocytes Relative: 39
Monocytes Absolute: 0.5

## 2011-03-26 LAB — URINALYSIS, ROUTINE W REFLEX MICROSCOPIC
Bilirubin Urine: NEGATIVE
Glucose, UA: NEGATIVE
Hgb urine dipstick: NEGATIVE
Specific Gravity, Urine: 1.016
pH: 5

## 2011-03-26 LAB — BASIC METABOLIC PANEL
BUN: 10
BUN: 8
CO2: 28
Calcium: 8.5
Chloride: 104
Chloride: 102
Creatinine, Ser: 0.97
Creatinine, Ser: 0.98
GFR calc Af Amer: >60
GFR calc non Af Amer: 59 — ABNORMAL LOW
GFR calc non Af Amer: 57 — ABNORMAL LOW
Glucose, Bld: 125 — ABNORMAL HIGH
Glucose, Bld: 128 — ABNORMAL HIGH
Potassium: 4
Potassium: 3.9
Sodium: 140

## 2011-03-26 LAB — TYPE AND SCREEN: Antibody Screen: NEGATIVE

## 2011-03-26 LAB — CBC
HCT: 38.9
HCT: 29.9 — ABNORMAL LOW
Hemoglobin: 13.3
MCHC: 34.3
MCV: 92.1
MCV: 92
MCV: 93.3
Platelets: 290
Platelets: 313
RBC: 3.13 — ABNORMAL LOW
RDW: 16.3 — ABNORMAL HIGH
RDW: 15.6 — ABNORMAL HIGH
WBC: 6.9
WBC: 7.6

## 2011-09-16 ENCOUNTER — Other Ambulatory Visit: Payer: Self-pay | Admitting: *Deleted

## 2011-09-16 MED ORDER — METFORMIN HCL ER 500 MG PO TB24: 500.0000 mg | ORAL_TABLET | Freq: Every day | ORAL | Status: DC

## 2011-09-17 ENCOUNTER — Other Ambulatory Visit: Payer: Self-pay

## 2011-09-17 MED ORDER — METFORMIN HCL ER 500 MG PO TB24: 500.0000 mg | ORAL_TABLET | Freq: Every day | ORAL | Status: DC

## 2011-11-19 ENCOUNTER — Ambulatory Visit (INDEPENDENT_AMBULATORY_CARE_PROVIDER_SITE_OTHER): Payer: Medicare Other | Admitting: Internal Medicine

## 2011-11-19 DIAGNOSIS — Z6836 Body mass index (BMI) 36.0-36.9, adult: Secondary | ICD-10-CM | POA: Insufficient documentation

## 2011-11-19 DIAGNOSIS — E119 Type 2 diabetes mellitus without complications: Secondary | ICD-10-CM

## 2011-11-19 DIAGNOSIS — I10 Essential (primary) hypertension: Secondary | ICD-10-CM | POA: Insufficient documentation

## 2011-11-19 DIAGNOSIS — F411 Generalized anxiety disorder: Secondary | ICD-10-CM

## 2011-11-19 DIAGNOSIS — N643 Galactorrhea not associated with childbirth: Secondary | ICD-10-CM

## 2011-11-19 DIAGNOSIS — J45909 Unspecified asthma, uncomplicated: Secondary | ICD-10-CM | POA: Insufficient documentation

## 2011-11-19 DIAGNOSIS — E232 Diabetes insipidus: Secondary | ICD-10-CM

## 2011-11-19 LAB — COMPREHENSIVE METABOLIC PANEL
Albumin: 3.8 g/dL (ref 3.5–5.2)
Alkaline Phosphatase: 56 U/L (ref 39–117)
BUN: 13 mg/dL (ref 6–23)
Calcium: 9.2 mg/dL (ref 8.4–10.5)
Creat: 0.9 mg/dL (ref 0.50–1.10)
Glucose, Bld: 137 mg/dL — ABNORMAL HIGH (ref 70–99)
Potassium: 3.6 mEq/L (ref 3.5–5.3)

## 2011-11-19 LAB — POCT CBC
Granulocyte percent: 46.2 %G (ref 37–80)
HCT, POC: 41.4 % (ref 37.7–47.9)
Lymph, poc: 2.2 (ref 0.6–3.4)
MCH, POC: 32 pg — AB (ref 27–31.2)
MCV: 97.4 fL — AB (ref 80–97)
MID (cbc): 0.5 (ref 0–0.9)
POC LYMPH PERCENT: 44.3 %L (ref 10–50)
RDW, POC: 17 %
WBC: 5 10*3/uL (ref 4.6–10.2)

## 2011-11-19 LAB — POCT GLYCOSYLATED HEMOGLOBIN (HGB A1C): Hemoglobin A1C: 6.5

## 2011-11-19 MED ORDER — ALPRAZOLAM 1 MG PO TABS: 1.0000 mg | ORAL_TABLET | Freq: Three times a day (TID) | ORAL | Status: DC | PRN

## 2011-11-19 MED ORDER — GLUCOSE BLOOD VI STRP: ORAL_STRIP | Status: AC

## 2011-11-19 MED ORDER — GLUCOSE BLOOD VI STRP: ORAL_STRIP | Status: DC

## 2011-11-19 NOTE — Progress Notes (Signed)
Subjective:    Patient ID: [name], female    DOB: [birth year], 66 y.o.   MRN: [id number]  HPI Patient Active Problem List  Diagnoses  . BMI 36.0-36.9,adult  . DM (diabetes mellitus)  . HTN (hypertension)  . Asthma  Here for six-month followup/test strips/has lost another 4 or 5 pounds/is doing well in retirement/llots of time with 2 grandkids/has had no problems with asthma this spring/continues on other medications without side effects  Has a new problem of discharge from her left nipple present on and off for 3 weeks Last mammogram one year ago was within normal limits Discharge is clear/breast is nontender and she notices no lumps She denies headaches or vision changes     Review of Systems  Constitutional: Negative for activity change, appetite change, fatigue and unexpected weight change.  HENT: Negative for hearing loss, rhinorrhea, sneezing, trouble swallowing, neck pain, dental problem and sinus pressure.   Eyes: Negative for photophobia and visual disturbance.  Respiratory: Negative for apnea, cough, shortness of breath and wheezing.   Cardiovascular: Negative for chest pain, palpitations and leg swelling.  Gastrointestinal: Negative for nausea, abdominal pain and diarrhea.  Genitourinary: Negative for dysuria, frequency and difficulty urinating.       Status post hysterectomy  Musculoskeletal: Negative for back pain and joint swelling.       Status post knee repair  Skin: Negative for rash.  Neurological: Negative for dizziness and headaches.  Hematological: Does not bruise/bleed easily.  Psychiatric/Behavioral: Negative for suicidal ideas, confusion, sleep disturbance and dysphoric mood.       Her anxiety is stable and rarely does she need Xanax for sleep or for anxiety caused by her grandkids       Objective:   Physical Exam  Constitutional: She is oriented to person, place, and time.       BMI 37  Eyes: Conjunctivae and EOM are normal. Pupils are  equal, round, and reactive to light.  Neck: Neck supple. No thyromegaly present.  Cardiovascular: Normal rate, regular rhythm, normal heart sounds and intact distal pulses.  Exam reveals no gallop and no friction rub.   No murmur heard.      No carotid bruit  Pulmonary/Chest: Effort normal and breath sounds normal. She has no wheezes.       The left breast has copious clear discharge when massaged around the periaerolar area that even comes out in a stream splitting 10-15 inches/no pus and no blood in the discharge/breasts exam is otherwise normal without masses or dimples/no axillary adenopathy  Abdominal: Soft. Bowel sounds are normal. She exhibits no mass. There is no tenderness.       No organomegaly  Musculoskeletal: She exhibits no edema.  Neurological: She is alert and oriented to person, place, and time. She has normal reflexes. No cranial nerve deficit.  Skin: No rash noted.  Psychiatric: She has a normal mood and affect. Her behavior is normal. Judgment and thought content normal.          Results for orders placed in visit on 11/19/11  POCT GLYCOSYLATED HEMOGLOBIN (HGB A1C)      Component Value Range   Hemoglobin A1C 6.5    POCT CBC      Component Value Range   WBC 5.0  4.6 - 10.2 (K/uL)   Lymph, poc 2.2  0.6 - 3.4    POC LYMPH PERCENT 44.3  10 - 50 (%L)   MID (cbc) 0.5  0 - 0.9    POC  MID % 9.5  0 - 12 (%M)   POC Granulocyte 2.3  2 - 6.9    Granulocyte percent 46.2  37 - 80 (%G)   RBC 4.25  4.04 - 5.48 (M/uL)   Hemoglobin 13.6  12.2 - 16.2 (g/dL)   HCT, POC 41.3  24.4 - 47.9 (%)   MCV 97.4 (*) 80 - 97 (fL)   MCH, POC 32.0 (*) 27 - 31.2 (pg)   MCHC 32.9  31.8 - 35.4 (g/dL)   RDW, POC 01.0     Platelet Count, POC 264  142 - 424 (K/uL)   MPV 7.5  0 - 99.8 (fL)    Assessment & Plan:   1. Diabetes mellitus HgbA1C 6.6%! I advised her that she no longer needs to check her blood sugars at home unless she has an issue POCT glycosylated hemoglobin (Hb A1C), POCT CBC,  Comprehensive metabolic panel Continue with weight loss    glucose blood (ONE TOUCH ULTRA TEST) test strip     2 Galactorrhea of left breast -This should be due to ductal pathology and she needs an ultrasound and diagnostic mammogram as the next step in evaluation/hour less than to do her routine mammogram on the other breast as well since it is due Prolactin, MM Digital Diagnostic Unilat L, US Breast Left  3 GAD (generalized anxiety disorder)  ALPRAZolam (XANAX) 1 MG tablet Refills for when necessary use   4 BMI 36.0-36.9,adult        5 HTN (hypertension)  She has meds for another 6 months  6. Asthma  She has meds for another 6 months

## 2011-11-22 ENCOUNTER — Encounter: Payer: Self-pay | Admitting: Internal Medicine

## 2011-11-30 ENCOUNTER — Other Ambulatory Visit: Payer: Self-pay | Admitting: Internal Medicine

## 2011-11-30 ENCOUNTER — Ambulatory Visit
Admission: RE | Admit: 2011-11-30 | Discharge: 2011-11-30 | Disposition: A | Discharge status: Home / Self Care | Payer: Medicare Other | Source: Ambulatory Visit | Attending: Internal Medicine | Admitting: Internal Medicine

## 2011-11-30 DIAGNOSIS — N643 Galactorrhea not associated with childbirth: Secondary | ICD-10-CM

## 2011-12-10 ENCOUNTER — Telehealth: Payer: Self-pay

## 2011-12-10 NOTE — Telephone Encounter (Signed)
Patient states she had mammogram done and would like results. Pt also states breast is still leaking.

## 2011-12-11 NOTE — Telephone Encounter (Signed)
[facility] notifying patient that we are unable to get results this weekend --[facility] also unable to pull them up--, but will call the [facility] Monday to get them and have MD review.  CB if ?'s  **PLEASE CALL [facility] MONDAY**

## 2011-12-11 NOTE — Telephone Encounter (Signed)
Unable to pull up results in [facility].  Called [facility] and they will fax copy.  I will give to Dr. Merla Riches when they come in.

## 2011-12-13 NOTE — Telephone Encounter (Signed)
Called [facility] and was told they have not finalized report d/t waiting on previous imaging so that comparison can be made. Transferred to [facility] who stated they just received those and will be sent back to be read. They should have results in a couple of days and will fax [place] the report. Notified pt of status and that we will contact her when we get the results and MD has reviewed them. Pt agreed.

## 2011-12-17 ENCOUNTER — Telehealth: Payer: Self-pay

## 2011-12-17 NOTE — Telephone Encounter (Signed)
Dr. Merla Riches, can you please review pt's mammogram results. Thanks

## 2011-12-17 NOTE — Telephone Encounter (Signed)
PT WOULD LIKE TO SPEAK WITH SOMEONE ABOUT A MAMMOGRAM SHE HAD DONE PLEASE CALL [phone]

## 2011-12-20 NOTE — Telephone Encounter (Signed)
All of her studies so far are normal and suggests that there is not any calf cancer/nothing explains her breast discharge A biopsy was done and the results are not available yet She should call the breast center and ask about the results of her biopsy and she has not heard from them or [place] within one week she should call me back

## 2011-12-20 NOTE — Telephone Encounter (Signed)
Spoke to patient. She got her letter from the [facility]. Everything was normal.

## 2012-01-26 ENCOUNTER — Encounter: Payer: Self-pay | Admitting: Gastroenterology

## 2012-06-30 ENCOUNTER — Telehealth: Payer: Self-pay | Admitting: *Deleted

## 2012-06-30 NOTE — Telephone Encounter (Signed)
Pt do not want to use diabetic supplies from [facility] pharmacy diabetic supply. So just shred if we get them.

## 2012-07-04 ENCOUNTER — Other Ambulatory Visit: Payer: Self-pay | Admitting: Internal Medicine

## 2012-08-09 ENCOUNTER — Telehealth: Payer: Self-pay | Admitting: *Deleted

## 2012-08-09 NOTE — Telephone Encounter (Signed)
Pt does not want to use [facility] diabetic supply/ [facility]. Please shred any paperwork that come thru for this.

## 2012-10-14 ENCOUNTER — Other Ambulatory Visit: Payer: Self-pay | Admitting: Internal Medicine

## 2012-10-19 ENCOUNTER — Encounter: Payer: Self-pay | Admitting: Gastroenterology

## 2012-10-23 ENCOUNTER — Other Ambulatory Visit: Payer: Self-pay | Admitting: Internal Medicine

## 2012-10-24 ENCOUNTER — Ambulatory Visit (INDEPENDENT_AMBULATORY_CARE_PROVIDER_SITE_OTHER): Payer: Medicare Other | Admitting: Internal Medicine

## 2012-10-24 VITALS — BP 118/68 | HR 81 | Temp 98.6°F | Resp 18 | Ht 62.75 in | Wt 193.0 lb

## 2012-10-24 DIAGNOSIS — E119 Type 2 diabetes mellitus without complications: Secondary | ICD-10-CM

## 2012-10-24 DIAGNOSIS — J45909 Unspecified asthma, uncomplicated: Secondary | ICD-10-CM

## 2012-10-24 DIAGNOSIS — Z6836 Body mass index (BMI) 36.0-36.9, adult: Secondary | ICD-10-CM

## 2012-10-24 DIAGNOSIS — I10 Essential (primary) hypertension: Secondary | ICD-10-CM

## 2012-10-24 LAB — POCT CBC
HCT, POC: 39.5 % (ref 37.7–47.9)
Hemoglobin: 12.4 g/dL (ref 12.2–16.2)
Lymph, poc: 1.4 (ref 0.6–3.4)
MCH, POC: 30.8 pg (ref 27–31.2)
MPV: 6.4 fL (ref 0–99.8)
POC MID %: 10.8 %M (ref 0–12)
RBC: 4.02 M/uL — AB (ref 4.04–5.48)
WBC: 3.1 10*3/uL — AB (ref 4.6–10.2)

## 2012-10-24 LAB — COMPREHENSIVE METABOLIC PANEL
ALT: 25 U/L (ref 0–35)
CO2: 20 mEq/L (ref 19–32)
Calcium: 9.2 mg/dL (ref 8.4–10.5)
Chloride: 92 mEq/L — ABNORMAL LOW (ref 96–112)
Creat: 0.96 mg/dL (ref 0.50–1.10)
Glucose, Bld: 164 mg/dL — ABNORMAL HIGH (ref 70–99)
Total Protein: 6.6 g/dL (ref 6.0–8.3)

## 2012-10-24 LAB — POCT GLYCOSYLATED HEMOGLOBIN (HGB A1C): Hemoglobin A1C: 6.1

## 2012-10-24 LAB — LIPID PANEL: Cholesterol: 180 mg/dL (ref 0–200)

## 2012-10-24 MED ORDER — ALBUTEROL SULFATE HFA 108 (90 BASE) MCG/ACT IN AERS: INHALATION_SPRAY | RESPIRATORY_TRACT | Status: DC

## 2012-10-24 MED ORDER — ATENOLOL 50 MG PO TABS: 50.0000 mg | ORAL_TABLET | Freq: Every day | ORAL | Status: DC

## 2012-10-24 MED ORDER — HYDROCHLOROTHIAZIDE 12.5 MG PO CAPS: 12.5000 mg | ORAL_CAPSULE | Freq: Every day | ORAL | Status: DC

## 2012-10-24 MED ORDER — AMLODIPINE BESYLATE 10 MG PO TABS: 10.0000 mg | ORAL_TABLET | Freq: Every day | ORAL | Status: DC

## 2012-10-24 NOTE — Progress Notes (Addendum)
Subjective:    Patient ID: [name], female    DOB: 1946/05/02, 67 y.o.   MRN: [id number]  HPI Patient Active Problem List  Diagnosis  . BMI 36.0-36.9,adult--lost 212 to 193  . DM (diabetes mellitus)--testing 80s to 170s  . HTN (hypertension)-bp ok at home  . Asthma-inhaler use occas based on weather  etoh on weekend Quit smok 8 y ago Not sleeping well-2-3 h at a time/wakes ?why  Past Medical History  Diagnosis Date  . Allergy-stable    . Arthritis-stable    . Depression-doing very well since retirement    . Asthma-controlled by intermittent inhaler use    . Diabetes mellitus without complication-no new symptoms    Home blood sugars relatively normal opth f/u good///no glau or dm chges Hasn't embarked on an exercise program with a 15 pound weight loss so far  Review of Systems  Constitutional: Positive for appetite change. Negative for fever, activity change, fatigue and unexpected weight change.  HENT: Positive for voice change. Negative for hearing loss, congestion, trouble swallowing and neck pain.   Eyes: Negative for photophobia and visual disturbance.  Respiratory: Negative for chest tightness, shortness of breath and wheezing.   Cardiovascular: Negative for chest pain, palpitations and leg swelling.  Gastrointestinal: Negative for abdominal pain, diarrhea, constipation and blood in stool.  Endocrine: Negative for polyuria.  Genitourinary: Negative for frequency and difficulty urinating.  Musculoskeletal: Negative for myalgias, arthralgias and gait problem.  Skin: Negative for rash.  Neurological: Negative for light-headedness and headaches.  Hematological: Negative for adenopathy. Does not bruise/bleed easily.  Psychiatric/Behavioral: Positive for sleep disturbance.       Erratic sleep for no known reason other than age and no schedule      Objective:   Physical Exam BP 118/68  Pulse 81  Temp(Src) 98.6 F (37 C) (Oral)  Resp 18  Ht 5' 2.75" (1.594  m)  Wt 193 lb (87.544 kg)  BMI 34.45 kg/m2  SpO2 96% HEENT clear Heart regular without murmur/no carotid bruits Lungs clear Extremities with no edema No sensory losses in the lower extremities Gait normal       Results for orders placed in visit on 10/24/12  POCT GLYCOSYLATED HEMOGLOBIN (HGB A1C)      Result Value Range   Hemoglobin A1C 6.1    POCT CBC      Result Value Range   WBC 3.1 (*) 4.6 - 10.2 K/uL   Lymph, poc 1.4  0.6 - 3.4   POC LYMPH PERCENT 46.4  10 - 50 %L   MID (cbc) 0.3  0 - 0.9   POC MID % 10.8  0 - 12 %M   POC Granulocyte 1.3 (*) 2 - 6.9   Granulocyte percent 42.8  37 - 80 %G   RBC 4.02 (*) 4.04 - 5.48 M/uL   Hemoglobin 12.4  12.2 - 16.2 g/dL   HCT, POC 27.2  53.6 - 47.9 %   MCV 98.2 (*) 80 - 97 fL   MCH, POC 30.8  27 - 31.2 pg   MCHC 31.4 (*) 31.8 - 35.4 g/dL   RDW, POC 64.4     Platelet Count, POC 278  142 - 424 K/uL   MPV 6.4  0 - 99.8 fL    Assessment & Plan:  HTN (hypertension) -  Plan: POCT CBC, Comprehensive metabolic panel, amLODipine (NORVASC) 10 MG tablet, decrease atenolol (TENORMIN) to  50 MG tablet, hydrochlorothiazide (MICROZIDE) 12.5 MG capsule  DM (diabetes mellitus)  -  Plan: POCT glycosylated hemoglobin (Hb A1C) 5.2= suggesting no further need for medication so metformin discontinued//to follow home blood pressure and blood sugar levels, Microalbumin, urine, Lipid panel  BMI 36.0-36.9,adult -Continue exercise program  Asthma  - Plan: albuterol (PROAIR HFA) 108 (90 BASE) MCG/ACT inhaler refills if needed  Notify labs and followup  4/24 labs=Trig>1000///elevated microalbumin

## 2012-10-25 LAB — MICROALBUMIN, URINE: Microalb, Ur: 7.47 mg/dL — ABNORMAL HIGH (ref 0.00–1.89)

## 2012-10-26 ENCOUNTER — Encounter: Payer: Self-pay | Admitting: Internal Medicine

## 2012-10-31 ENCOUNTER — Encounter: Payer: Self-pay | Admitting: Internal Medicine

## 2012-11-22 ENCOUNTER — Encounter (HOSPITAL_COMMUNITY): Payer: Self-pay | Admitting: Adult Health

## 2012-11-22 ENCOUNTER — Emergency Department (HOSPITAL_COMMUNITY)
Admission: EM | Admit: 2012-11-22 | Discharge: 2012-11-23 | Disposition: A | Discharge status: Home / Self Care | Payer: Medicare Other | Attending: Emergency Medicine | Admitting: Emergency Medicine

## 2012-11-22 DIAGNOSIS — F329 Major depressive disorder, single episode, unspecified: Secondary | ICD-10-CM | POA: Insufficient documentation

## 2012-11-22 DIAGNOSIS — S335XXA Sprain of ligaments of lumbar spine, initial encounter: Secondary | ICD-10-CM | POA: Insufficient documentation

## 2012-11-22 DIAGNOSIS — Y999 Unspecified external cause status: Secondary | ICD-10-CM | POA: Insufficient documentation

## 2012-11-22 DIAGNOSIS — E119 Type 2 diabetes mellitus without complications: Secondary | ICD-10-CM | POA: Insufficient documentation

## 2012-11-22 DIAGNOSIS — J309 Allergic rhinitis, unspecified: Secondary | ICD-10-CM | POA: Insufficient documentation

## 2012-11-22 DIAGNOSIS — X58XXXA Exposure to other specified factors, initial encounter: Secondary | ICD-10-CM | POA: Insufficient documentation

## 2012-11-22 DIAGNOSIS — S39012A Strain of muscle, fascia and tendon of lower back, initial encounter: Secondary | ICD-10-CM

## 2012-11-22 DIAGNOSIS — Y929 Unspecified place or not applicable: Secondary | ICD-10-CM | POA: Insufficient documentation

## 2012-11-22 DIAGNOSIS — M129 Arthropathy, unspecified: Secondary | ICD-10-CM | POA: Insufficient documentation

## 2012-11-22 DIAGNOSIS — Z87891 Personal history of nicotine dependence: Secondary | ICD-10-CM | POA: Insufficient documentation

## 2012-11-22 DIAGNOSIS — F3289 Other specified depressive episodes: Secondary | ICD-10-CM | POA: Insufficient documentation

## 2012-11-22 DIAGNOSIS — Z79899 Other long term (current) drug therapy: Secondary | ICD-10-CM | POA: Insufficient documentation

## 2012-11-22 DIAGNOSIS — J45909 Unspecified asthma, uncomplicated: Secondary | ICD-10-CM | POA: Insufficient documentation

## 2012-11-22 MED ORDER — DIAZEPAM 5 MG PO TABS
5.0000 mg | ORAL_TABLET | Freq: Once | ORAL | Status: AC
  Administered 2012-11-23: 5 mg via ORAL
  Filled 2012-11-22: qty 1

## 2012-11-22 NOTE — ED Provider Notes (Signed)
History     CSN: [id number]  Arrival date & time 11/22/12  2100   First MD Initiated Contact with Patient 11/22/12 2219      Chief Complaint  Patient presents with  . Back Pain    (Consider location/radiation/quality/duration/timing/severity/associated sxs/prior treatment) HPI History provided by pt.   Pt has had constant, severe, non-radiating, diffuse low back pain x 3 days. Aggravated by movement.  Relief w/ vicodin.  Denies fever, abdominal pain, bladder/bowel dysfunction and lower extremity weakness/parasthesias.   Denies trauma but was cleaning out a closet over the weekend.  No h/o back problems.  Past Medical History  Diagnosis Date  . Allergy   . Arthritis   . Depression   . Asthma   . Diabetes mellitus without complication     Past Surgical History  Procedure Laterality Date  . Abdominal hysterectomy    . Tubal ligation    . Joint replacement      Family History  Problem Relation Age of Onset  . Asthma Mother   . Asthma Father     History  Substance Use Topics  . Smoking status: Former Games developer  . Smokeless tobacco: Not on file  . Alcohol Use: Yes    OB History   Grav Para Term Preterm Abortions TAB SAB Ect Mult Living                  Review of Systems  All other systems reviewed and are negative.    Allergies  Review of patient's allergies indicates no known allergies.  Home Medications   Current Outpatient Rx  Name  Route  Sig  Dispense  Refill  . albuterol (PROVENTIL HFA;VENTOLIN HFA) 108 (90 BASE) MCG/ACT inhaler   Inhalation   Inhale 1-2 puffs into the lungs every 4 (four) hours as needed for wheezing or shortness of breath.         . ALPRAZolam (XANAX) 1 MG tablet   Oral   Take 1 mg by mouth 3 (three) times daily as needed for sleep or anxiety.         Marland Kitchen amLODipine (NORVASC) 10 MG tablet   Oral   Take 1 tablet (10 mg total) by mouth daily.   90 tablet   1   . atenolol (TENORMIN) 50 MG tablet   Oral   Take 100 mg by  mouth daily.         . hydrochlorothiazide (MICROZIDE) 12.5 MG capsule   Oral   Take 1 capsule (12.5 mg total) by mouth daily.   90 capsule   1     BP 136/82  Pulse 74  Temp(Src) 98.6 F (37 C) (Oral)  Resp 16  SpO2 96%  Physical Exam  Nursing note and vitals reviewed. Constitutional: She is oriented to person, place, and time. She appears well-developed and well-nourished. No distress.  Appears uncomfortable w/ movement.  HENT:  Head: Normocephalic and atraumatic.  Eyes:  Normal appearance  Neck: Normal range of motion.  Cardiovascular: Normal rate and regular rhythm.   Pulmonary/Chest: Effort normal and breath sounds normal. No respiratory distress.  Abdominal: Soft. She exhibits no distension and no mass. There is no tenderness. There is no rebound and no guarding.  Genitourinary:  No CVA tenderness  Musculoskeletal: Normal range of motion.  Mid-line less so than R Lumbar region ttp. Full active ROM of LE.  Nml patellar reflexes.  No saddle anesthesia. Distal sensation intact.  2+ DP pulses.  Ambulates w/out diffulty.   Neurological:  She is alert and oriented to person, place, and time.  Skin: Skin is warm and dry. No rash noted.  Psychiatric: She has a normal mood and affect. Her behavior is normal.    ED Course  Procedures (including critical care time)  Labs Reviewed - No data to display Dg Lumbar Spine Complete  11/23/2012   *RADIOLOGY REPORT*  Clinical Data: Back pain.  LUMBAR SPINE - COMPLETE 4+ VIEW  Comparison: None  Findings: There is a mild left convex lumbar scoliosis.  Mild degenerative lumbar spondylosis with disc disease and facet disease.  No acute bony findings or destructive bony changes.  The visualized bony pelvis is grossly normal.  There are bilateral hip prosthesis noted.  Right upper quadrant calcification is most likely a large gallstone.  IMPRESSION:  1.  Normal alignment and no acute bony findings. 2.  Mild degenerative disc disease and facet  disease.   Original Report Authenticated By: Rudie Meyer, M.D.     1. Lumbar strain, initial encounter       MDM  [phone] F presents w/ non-traumatic low back pain.  NAD, afebrile, normotensive, R lumbar musculature worse than mid-line ttp, no NV deficits LEs.  Suspect muscle strain.  Doubt abd aortic dissection based on characteristics of pain as well as exam.  Pt to receive po valium.  Will reassess shortly.  11:51 PM   Dr. Nicanor Alcon recommended xray lumbar spine d/t patient's age.  No acute findings.  Results discussed w/ patient.  She had improvement in pain w/ valium.  Prescribed same + vicodin and recommended low dose NSAID, rest and heat/ice as well.  Referred to NS for persistent pain.  Return precautions discussed. 1:25 AM       Otilio Miu, PA-C 11/23/12 [phone]

## 2012-11-22 NOTE — ED Notes (Addendum)
Presents with lumbar back pain that began Saturday and has progressively gotten worse. Pain is described as "knives jabbing in my back" worse with movement. Pain pills have helped a little and made a little better.  Pt reports cleaning a closet and that is when the pain began.

## 2012-11-23 ENCOUNTER — Emergency Department (HOSPITAL_COMMUNITY): Payer: Medicare Other

## 2012-11-23 MED ORDER — HYDROCODONE-ACETAMINOPHEN 5-325 MG PO TABS: 1.0000 | ORAL_TABLET | ORAL | Status: DC | PRN

## 2012-11-23 MED ORDER — DIAZEPAM 5 MG PO TABS: 5.0000 mg | ORAL_TABLET | Freq: Two times a day (BID) | ORAL | Status: DC

## 2012-11-23 NOTE — Discharge Instructions (Signed)
Take vicodin as needed for severe pain and valium as needed for spasm.   Do not drive within four hours of taking this medication (may cause drowsiness or confusion).   Apply heat or ice 2-3 times a day for 15-20 minutes.  Avoid activities that aggravate pain.   If your pain has not started to improve in 7-10 days, follow up with the neurosurgeon you have been referred to.  You should return to the ER if you develop change in or worsening of pain, severe abdominal pain, fever (100.5 degrees or greater), inability to walk due to leg weakness or loss of control of bladder/bowels.

## 2012-11-23 NOTE — ED Provider Notes (Signed)
Medical screening examination/treatment/procedure(s) were performed by non-physician practitioner and as supervising physician I was immediately available for consultation/collaboration.  Hulan Szumski K Clint Biello-Rasch, MD 11/23/12 0134 

## 2012-12-11 ENCOUNTER — Telehealth: Payer: Self-pay

## 2012-12-11 NOTE — Telephone Encounter (Signed)
PT is calling to get a refill on her Xanax Pharmacy is [facility] on [address] back number [phone]

## 2012-12-12 NOTE — Telephone Encounter (Signed)
Please advise 

## 2012-12-14 MED ORDER — ALPRAZOLAM 1 MG PO TABS: 1.0000 mg | ORAL_TABLET | Freq: Three times a day (TID) | ORAL | Status: DC | PRN

## 2012-12-14 NOTE — Telephone Encounter (Signed)
lmom that rx was sent in. rx sent in

## 2012-12-14 NOTE — Telephone Encounter (Signed)
Meds ordered this encounter  Medications  . ALPRAZolam (XANAX) 1 MG tablet    Sig: Take 1 tablet (1 mg total) by mouth 3 (three) times daily as needed for sleep or anxiety.    Dispense:  30 tablet    Refill:  5

## 2013-01-16 ENCOUNTER — Ambulatory Visit: Payer: Medicare Other

## 2013-01-16 ENCOUNTER — Ambulatory Visit (INDEPENDENT_AMBULATORY_CARE_PROVIDER_SITE_OTHER): Payer: Medicare Other | Admitting: Internal Medicine

## 2013-01-16 VITALS — BP 132/80 | HR 76 | Temp 97.8°F | Resp 18 | Ht 62.75 in | Wt 192.4 lb

## 2013-01-16 DIAGNOSIS — M25551 Pain in right hip: Secondary | ICD-10-CM

## 2013-01-16 DIAGNOSIS — M25559 Pain in unspecified hip: Secondary | ICD-10-CM

## 2013-01-16 DIAGNOSIS — Z96643 Presence of artificial hip joint, bilateral: Secondary | ICD-10-CM | POA: Insufficient documentation

## 2013-01-16 DIAGNOSIS — I10 Essential (primary) hypertension: Secondary | ICD-10-CM

## 2013-01-16 DIAGNOSIS — E781 Pure hyperglyceridemia: Secondary | ICD-10-CM

## 2013-01-16 DIAGNOSIS — E119 Type 2 diabetes mellitus without complications: Secondary | ICD-10-CM

## 2013-01-16 DIAGNOSIS — Z6836 Body mass index (BMI) 36.0-36.9, adult: Secondary | ICD-10-CM

## 2013-01-16 LAB — LIPID PANEL
Cholesterol: 194 mg/dL (ref 0–200)
LDL Cholesterol: 101 mg/dL — ABNORMAL HIGH (ref 0–99)
Total CHOL/HDL Ratio: 4.9 Ratio
VLDL: 53 mg/dL — ABNORMAL HIGH (ref 0–40)

## 2013-01-16 LAB — POCT CBC
Hemoglobin: 14.8 g/dL (ref 12.2–16.2)
Lymph, poc: 1.7 (ref 0.6–3.4)
MCHC: 31.4 g/dL — AB (ref 31.8–35.4)
MID (cbc): 0.5 (ref 0–0.9)
MPV: 7.9 fL (ref 0–99.8)
POC Granulocyte: 2.7 (ref 2–6.9)
POC MID %: 9.4 %M (ref 0–12)
Platelet Count, POC: 366 10*3/uL (ref 142–424)
RBC: 4.79 M/uL (ref 4.04–5.48)

## 2013-01-16 LAB — COMPREHENSIVE METABOLIC PANEL
ALT: 20 U/L (ref 0–35)
AST: 22 U/L (ref 0–37)
CO2: 29 mEq/L (ref 19–32)
Creat: 1.21 mg/dL — ABNORMAL HIGH (ref 0.50–1.10)
Sodium: 140 mEq/L (ref 135–145)
Total Bilirubin: 0.5 mg/dL (ref 0.3–1.2)
Total Protein: 6.9 g/dL (ref 6.0–8.3)

## 2013-01-16 LAB — POCT GLYCOSYLATED HEMOGLOBIN (HGB A1C): Hemoglobin A1C: 6.1

## 2013-01-16 MED ORDER — MELOXICAM 15 MG PO TABS: 15.0000 mg | ORAL_TABLET | Freq: Every day | ORAL | Status: DC

## 2013-01-16 MED ORDER — TRAMADOL HCL 50 MG PO TABS: ORAL_TABLET | ORAL | Status: DC

## 2013-01-16 NOTE — Progress Notes (Signed)
Subjective:    Patient ID: [name], female    DOB: 12/11/1945, 67 y.o.   MRN: [id number]  HPI [name] is a 67 y.o. female with a history of bilateral hip replacements presenting with RIGHT hip pain x 2 months.  ED visit on 5/21 for severe hip pain. X-Ray (lumbar spine) performed - mild disc disease.   Given Valium and Hydrocodone - "eased up the pain a little bit" and helped her sleep at night.  Ran out within a week. Now pain interferes significantly with activity and with sleep Hip pain is worse with certain movements and leaning to the right.  Feels like knife.  Been using ibuprofen since running out of Valium/Hydrocodone.  Also borrowed pain pills from friends (doesn't not know what kind) and used muscle spasm pills from previous rx.  Also, she checked blood glucose at home - ran 140ish when fasting 3 weeks ago.    Was d/c on Metformin at last visit b/c of great progress - HgbA1c < 6. No recent weight gain has discontinued alcohol  Patient Active Problem List   Diagnosis Date Noted  . H/O bilateral hip replacements 01/16/2013  . BMI 36.0-36.9,adult 11/19/2011  . DM (diabetes mellitus) 11/19/2011  . HTN (hypertension) 11/19/2011  . Asthma 11/19/2011   other problems stable .  Past medical history, surgical history, medications, allergies, social history and family history have been reviewed.  Review of Systems As stated in HPI - otherwise negative.     Objective:   Physical Exam Filed Vitals:   01/16/13 0831  BP: 132/80  Pulse: 76  Temp: 97.8 F (36.6 C)  TempSrc: Oral  Resp: 18  Height: 5' 2.75" (1.594 m)  Weight: 192 lb 6.4 oz (87.272 kg)  SpO2: 96%   General:  Obese  female in no acute distress. Skin:  Soft, warm skin with good turgor.  No bruising or lesions present.  Heart regular  Lungs clear  MSK:  Tenderness to palpation of trochanteric bursa.  Pain with active and passive ROM in internal and external rotation laying down is minimal  except at the trochanteric bursa.    Straight leg raise to 90 bilaterally normal  No distal extremity sensory or motor losses  Results for orders placed in visit on 01/16/13  POCT GLYCOSYLATED HEMOGLOBIN (HGB A1C)      Result Value Range   Hemoglobin A1C 6.1    POCT CBC      Result Value Range   WBC 4.8  4.6 - 10.2 K/uL   Lymph, poc 1.7  0.6 - 3.4   POC LYMPH PERCENT 34.4  10 - 50 %L   MID (cbc) 0.5  0 - 0.9   POC MID % 9.4  0 - 12 %M   POC Granulocyte 2.7  2 - 6.9   Granulocyte percent 56.2  37 - 80 %G   RBC 4.79  4.04 - 5.48 M/uL   Hemoglobin 14.8  12.2 - 16.2 g/dL   HCT, POC 40.9  81.1 - 47.9 %   MCV 98.3 (*) 80 - 97 fL   MCH, POC 30.9  27 - 31.2 pg   MCHC 31.4 (*) 31.8 - 35.4 g/dL   RDW, POC 91.4     Platelet Count, POC 366  142 - 424 K/uL   MPV 7.9  0 - 99.8 fL     [facility] reading (PRIMARY) by  Dr. Merla Riches hips with prostheses appear normal   Assessment & Plan:  1. DM (  diabetes mellitus) HgbA1c still looks great.  Will continue without Metformin as long as she is well-controlled.    Ordered: - POCT glycosylated hemoglobin (Hb A1C)  2. Hypertriglyceridemia Would like to recheck based on elevated levels in April 2014.  Ordered: - Lipid panel  3. HTN (hypertension) Stable.    Ordered: - POCT CBC - Comprehensive metabolic panel  4. BMI 36.0-36.9,adult Encouraged healthy eating.  Recommend light exercise once hip pain is resolved.    5. H/O bilateral hip replacements Stable.    6. Pain, hip, right  Ordered:   - DG Hip Complete Right; Future - Ambulatory referral to Orthopedic Surgery To consider injection versus the need for imaging to further delineate the cause  Prescribed:   - traMADol (ULTRAM) 50 MG tablet; 1-2 q 6 h prn pain  Dispense: 30 tablet; Refill: 0 - meloxicam (MOBIC) 15 MG tablet; Take 1 tablet (15 mg total) by mouth daily.  Dispense: 30 tablet; Refill: 0   I participated fully in evaluation and treatment plan. I have reviewed and  agree with documentation. Robert P. Merla Riches, M.D.

## 2013-01-19 ENCOUNTER — Encounter: Payer: Self-pay | Admitting: Internal Medicine

## 2013-02-27 ENCOUNTER — Telehealth: Payer: Self-pay

## 2013-02-27 NOTE — Telephone Encounter (Signed)
Diabetes testing supplies company is calling about prescription for this patient. Please call [phone] ext. [phone]

## 2013-03-01 NOTE — Telephone Encounter (Signed)
Located form in [employer]. Completed and signed by Lanora Manis. Faxed to [facility] w confirmation and scanned.

## 2013-05-24 ENCOUNTER — Ambulatory Visit (INDEPENDENT_AMBULATORY_CARE_PROVIDER_SITE_OTHER): Payer: Medicare Other | Admitting: Physician Assistant

## 2013-05-24 VITALS — BP 130/80 | HR 74 | Temp 97.9°F | Resp 16 | Ht 62.5 in | Wt 209.0 lb

## 2013-05-24 DIAGNOSIS — E559 Vitamin D deficiency, unspecified: Secondary | ICD-10-CM | POA: Insufficient documentation

## 2013-05-24 DIAGNOSIS — F418 Other specified anxiety disorders: Secondary | ICD-10-CM | POA: Insufficient documentation

## 2013-05-24 DIAGNOSIS — Z23 Encounter for immunization: Secondary | ICD-10-CM

## 2013-05-24 DIAGNOSIS — I10 Essential (primary) hypertension: Secondary | ICD-10-CM

## 2013-05-24 DIAGNOSIS — Z87891 Personal history of nicotine dependence: Secondary | ICD-10-CM | POA: Insufficient documentation

## 2013-05-24 DIAGNOSIS — I872 Venous insufficiency (chronic) (peripheral): Secondary | ICD-10-CM | POA: Insufficient documentation

## 2013-05-24 DIAGNOSIS — Z1159 Encounter for screening for other viral diseases: Secondary | ICD-10-CM

## 2013-05-24 DIAGNOSIS — Z6836 Body mass index (BMI) 36.0-36.9, adult: Secondary | ICD-10-CM

## 2013-05-24 DIAGNOSIS — E119 Type 2 diabetes mellitus without complications: Secondary | ICD-10-CM

## 2013-05-24 LAB — COMPREHENSIVE METABOLIC PANEL
ALT: 53 U/L — ABNORMAL HIGH (ref 0–35)
AST: 31 U/L (ref 0–37)
Albumin: 3.4 g/dL — ABNORMAL LOW (ref 3.5–5.2)
Alkaline Phosphatase: 87 U/L (ref 39–117)
BUN: 15 mg/dL (ref 6–23)
CO2: 30 mEq/L (ref 19–32)
Calcium: 9.2 mg/dL (ref 8.4–10.5)
Chloride: 102 mEq/L (ref 96–112)
Creat: 1.2 mg/dL — ABNORMAL HIGH (ref 0.50–1.10)
Potassium: 3.9 mEq/L (ref 3.5–5.3)
Sodium: 139 mEq/L (ref 135–145)

## 2013-05-24 LAB — POCT CBC
HCT, POC: 42.8 % (ref 37.7–47.9)
Hemoglobin: 13.4 g/dL (ref 12.2–16.2)
Lymph, poc: 1.8 (ref 0.6–3.4)
MCH, POC: 30.9 pg (ref 27–31.2)
MCHC: 31.3 g/dL — AB (ref 31.8–35.4)
MCV: 95.6 fL (ref 80–97)
POC Granulocyte: 1.7 — AB (ref 2–6.9)
POC LYMPH PERCENT: 46.5 %L (ref 10–50)
RDW, POC: 15.7 %
WBC: 3.8 10*3/uL — AB (ref 4.6–10.2)

## 2013-05-24 LAB — POCT UA - MICROSCOPIC ONLY
Bacteria, U Microscopic: NEGATIVE
Mucus, UA: NEGATIVE
RBC, urine, microscopic: NEGATIVE
WBC, Ur, HPF, POC: NEGATIVE
Yeast, UA: NEGATIVE

## 2013-05-24 LAB — POCT URINALYSIS DIPSTICK
Bilirubin, UA: NEGATIVE
Blood, UA: NEGATIVE
Ketones, UA: NEGATIVE
Spec Grav, UA: 1.02
pH, UA: 5.5

## 2013-05-24 LAB — GLUCOSE, POCT (MANUAL RESULT ENTRY): POC Glucose: 216 mg/dl — AB (ref 70–99)

## 2013-05-24 LAB — LIPID PANEL
Cholesterol: 140 mg/dL (ref 0–200)
HDL: 35 mg/dL — ABNORMAL LOW (ref >39)
LDL Cholesterol: 46 mg/dL (ref 0–99)

## 2013-05-24 NOTE — Progress Notes (Signed)
[address]  [place], [place] [address]  [phone]  [email or web address]  Subjective:    Patient ID: [name], female    DOB: [birth year], 67 y.o.   MRN: [id number]  PCP: Tonye Pearson, MD  HPI This 67 y.o. female presents for evaluation of elevated blood sugar.  She was taken off of metformin about a year ago as she has good control, and her A1C 6 months ago was still <7%. She denies any recent changes in her medications, eating habits, activities.  She also denies symptoms suggestive of illness including nasal/sinus congestion, sore throat, cough, nausea, vomiting, diarrhea, unexplained muscle pain, fever, chills.  Frequency of home glucose monitoring: daily, usually <160, but for the past week it's been about 200 Sees a dentist "every year or so," eye specialist annually. Checks feet daily. Is current with influenza vaccine. Is current with pneumococcal vaccine. Has a prescription for Zostavax, but hasn't received the vaccine yet due to cost.  Medications, allergies, past medical history, surgical history, family history, social history and problem list reviewed and updated.   Review of Systems Denies chest pain, shortness of breath, HA, dizziness, vision change, nausea, vomiting, diarrhea, constipation, melena, hematochezia, dysuria, increased urinary urgency or frequency, increased hunger or thirst, unintentional weight change, unexplained myalgias or arthralgias, rash.     Objective:   Physical Exam  Blood pressure 130/80, pulse 74, temperature 97.9 F (36.6 C), temperature source Oral, resp. rate 16, height 5' 2.5" (1.588 m), weight 209 lb (94.802 kg), SpO2 96.00%. Body mass index is 37.59 kg/(m^2). Well-developed, well nourished BF who is awake, alert and oriented, in NAD. HEENT: [place]/AT, sclera and conjunctiva are clear.   Neck: supple, non-tender, no lymphadenopathy, thyromegaly. Heart: RRR, no murmur Lungs: normal effort, CTA Extremities: no cyanosis,  clubbing or edema. Skin: warm and dry without rash. Psychologic: good mood and appropriate affect, normal speech and behavior.  See DM foot exam  Results for orders placed in visit on 05/24/13  POCT CBC      Result Value Range   WBC 3.8 (*) 4.6 - 10.2 K/uL   Lymph, poc 1.8  0.6 - 3.4   POC LYMPH PERCENT 46.5  10 - 50 %L   MID (cbc) 0.3  0 - 0.9   POC MID % 7.6  0 - 12 %M   POC Granulocyte 1.7 (*) 2 - 6.9   Granulocyte percent 45.9  37 - 80 %G   RBC 4.34  4.04 - 5.48 M/uL   Hemoglobin 13.4  12.2 - 16.2 g/dL   HCT, POC 86.5  78.4 - 47.9 %   MCV 95.6  80 - 97 fL   MCH, POC 30.9  27 - 31.2 pg   MCHC 31.3 (*) 31.8 - 35.4 g/dL   RDW, POC 69.6     Platelet Count, POC 210  142 - 424 K/uL   MPV 8.0  0 - 99.8 fL  GLUCOSE, POCT (MANUAL RESULT ENTRY)      Result Value Range   POC Glucose 216 (*) 70 - 99 mg/dl  POCT GLYCOSYLATED HEMOGLOBIN (HGB A1C)      Result Value Range   Hemoglobin A1C 7.8    POCT UA - MICROSCOPIC ONLY      Result Value Range   WBC, Ur, HPF, POC neg     RBC, urine, microscopic neg     Bacteria, U Microscopic neg     Mucus, UA neg     Epithelial cells, urine per micros 0-2  Crystals, Ur, HPF, POC neg     Casts, Ur, LPF, POC neg     Yeast, UA neg    POCT URINALYSIS DIPSTICK      Result Value Range   Color, UA yellow     Clarity, UA clear     Glucose, UA neg     Bilirubin, UA neg     Ketones, UA neg     Spec Grav, UA 1.020     Blood, UA neg     pH, UA 5.5     Protein, UA neg     Urobilinogen, UA 0.2     Nitrite, UA neg     Leukocytes, UA Negative         Assessment & Plan:  DM (diabetes mellitus) - Plan: HM Diabetes Foot Exam, POCT glucose (manual entry), POCT glycosylated hemoglobin (Hb A1C), Comprehensive metabolic panel, Lipid panel, POCT UA - Microscopic Only, POCT urinalysis dipstick, Microalbumin, urine  HTN (hypertension) - Plan: POCT CBC  BMI 36.0-36.9,adult  Need for hepatitis C screening test - Plan: Hepatitis C antibody  Need for  pneumococcal vaccination - Plan: Pneumococcal polysaccharide vaccine 23-valent greater than or equal to 2yo subcutaneous/IM  Await lab results.  If renal function is good, plan to restart metformin 500 mg once daily.  In the meantime, encouraged increased water and exercise.  Follow-up with Dr. Merla Riches in 3 months.  Fernande Bras, PA-C Physician Assistant-Certified [facility]

## 2013-05-24 NOTE — Patient Instructions (Signed)
I will contact you with your lab results as soon as they are available.   If you have not heard from me in 2 weeks, please contact me.  The fastest way to get your results is to register for My Chart (see the instructions on the last page of this printout).  I think we need to restart the metformin, but just a low dose. However, we need to wait to see what the results of the blood tests are to make sure your kidneys are working well. I will contact you when I get the results, and either restart the metformin or start something else. In the meantime, please make sure you're drinking plenty of liquids and when you sugar is up, go for a little exercise!

## 2013-05-25 NOTE — Progress Notes (Signed)
Made appointment with Dr Merla Riches for 08/22/13 @ 12:15pm.

## 2013-06-20 ENCOUNTER — Other Ambulatory Visit: Payer: Self-pay | Admitting: Physician Assistant

## 2013-06-20 DIAGNOSIS — M5134 Other intervertebral disc degeneration, thoracic region: Secondary | ICD-10-CM

## 2013-06-25 ENCOUNTER — Ambulatory Visit
Admission: RE | Admit: 2013-06-25 | Discharge: 2013-06-25 | Disposition: A | Discharge status: Home / Self Care | Payer: Medicare Other | Source: Ambulatory Visit | Attending: Physician Assistant | Admitting: Physician Assistant

## 2013-06-25 DIAGNOSIS — M5134 Other intervertebral disc degeneration, thoracic region: Secondary | ICD-10-CM

## 2013-06-25 MED ORDER — METHYLPREDNISOLONE ACETATE 40 MG/ML INJ SUSP (RADIOLOG
120.0000 mg | Freq: Once | INTRAMUSCULAR | Status: AC
  Administered 2013-06-25: 120 mg via EPIDURAL

## 2013-06-25 MED ORDER — IOHEXOL 180 MG/ML  SOLN
1.0000 mL | Freq: Once | INTRAMUSCULAR | Status: AC | PRN
  Administered 2013-06-25: 1 mL via EPIDURAL

## 2013-07-03 ENCOUNTER — Telehealth: Payer: Self-pay

## 2013-07-03 NOTE — Telephone Encounter (Signed)
Received fax from [employer]. For TENS unit. Called pt and she does not want this. Faxed back w/note.

## 2013-07-17 ENCOUNTER — Encounter: Payer: Self-pay | Admitting: Physician Assistant

## 2013-07-17 DIAGNOSIS — M5124 Other intervertebral disc displacement, thoracic region: Secondary | ICD-10-CM | POA: Insufficient documentation

## 2013-08-22 ENCOUNTER — Encounter: Payer: Self-pay | Admitting: Internal Medicine

## 2013-08-22 ENCOUNTER — Other Ambulatory Visit: Payer: Self-pay | Admitting: Internal Medicine

## 2013-08-22 ENCOUNTER — Ambulatory Visit (INDEPENDENT_AMBULATORY_CARE_PROVIDER_SITE_OTHER): Payer: Medicare Other | Admitting: Internal Medicine

## 2013-08-22 VITALS — BP 138/70 | HR 84 | Temp 98.2°F | Resp 16 | Ht 62.5 in | Wt 202.8 lb

## 2013-08-22 DIAGNOSIS — I10 Essential (primary) hypertension: Secondary | ICD-10-CM

## 2013-08-22 DIAGNOSIS — R7989 Other specified abnormal findings of blood chemistry: Secondary | ICD-10-CM

## 2013-08-22 DIAGNOSIS — R945 Abnormal results of liver function studies: Secondary | ICD-10-CM

## 2013-08-22 DIAGNOSIS — Z13820 Encounter for screening for osteoporosis: Secondary | ICD-10-CM

## 2013-08-22 DIAGNOSIS — F341 Dysthymic disorder: Secondary | ICD-10-CM

## 2013-08-22 DIAGNOSIS — F418 Other specified anxiety disorders: Secondary | ICD-10-CM

## 2013-08-22 DIAGNOSIS — Z6836 Body mass index (BMI) 36.0-36.9, adult: Secondary | ICD-10-CM

## 2013-08-22 DIAGNOSIS — E119 Type 2 diabetes mellitus without complications: Secondary | ICD-10-CM

## 2013-08-22 LAB — COMPLETE METABOLIC PANEL WITH GFR
ALBUMIN: 3.7 g/dL (ref 3.5–5.2)
ALK PHOS: 75 U/L (ref 39–117)
ALT: 42 U/L — ABNORMAL HIGH (ref 0–35)
AST: 56 U/L — ABNORMAL HIGH (ref 0–37)
BUN: 16 mg/dL (ref 6–23)
CO2: 30 meq/L (ref 19–32)
Calcium: 9.7 mg/dL (ref 8.4–10.5)
Chloride: 96 mEq/L (ref 96–112)
Creat: 0.95 mg/dL (ref 0.50–1.10)
GFR, EST NON AFRICAN AMERICAN: 61 mL/min
GFR, Est African American: 71 mL/min
GLUCOSE: 231 mg/dL — AB (ref 70–99)
POTASSIUM: 3.7 meq/L (ref 3.5–5.3)
Sodium: 137 mEq/L (ref 135–145)
TOTAL PROTEIN: 6.4 g/dL (ref 6.0–8.3)
Total Bilirubin: 0.7 mg/dL (ref 0.2–1.2)

## 2013-08-22 LAB — CBC
HCT: 42.7 % (ref 36.0–46.0)
Hemoglobin: 14.1 g/dL (ref 12.0–15.0)
MCH: 30.7 pg (ref 26.0–34.0)
MCHC: 33 g/dL (ref 30.0–36.0)
MCV: 92.8 fL (ref 78.0–100.0)
PLATELETS: 299 10*3/uL (ref 150–400)
RBC: 4.6 MIL/uL (ref 3.87–5.11)
RDW: 17.6 % — ABNORMAL HIGH (ref 11.5–15.5)
WBC: 3.5 10*3/uL — ABNORMAL LOW (ref 4.0–10.5)

## 2013-08-22 LAB — LIPID PANEL
CHOL/HDL RATIO: 2.7 ratio
Cholesterol: 136 mg/dL (ref 0–200)
HDL: 51 mg/dL (ref >39)
LDL CALC: 7 mg/dL (ref 0–99)
Triglycerides: 388 mg/dL — ABNORMAL HIGH (ref <150)
VLDL: 78 mg/dL — ABNORMAL HIGH (ref 0–40)

## 2013-08-22 LAB — POCT GLYCOSYLATED HEMOGLOBIN (HGB A1C): HEMOGLOBIN A1C: 7.7

## 2013-08-22 MED ORDER — AMLODIPINE BESYLATE 10 MG PO TABS: 10.0000 mg | ORAL_TABLET | Freq: Every day | ORAL | Status: DC

## 2013-08-22 MED ORDER — ATENOLOL 50 MG PO TABS: 100.0000 mg | ORAL_TABLET | Freq: Every day | ORAL | Status: DC

## 2013-08-22 MED ORDER — ZOSTER VACCINE LIVE 19400 UNT/0.65ML ~~LOC~~ SOLR: 0.6500 mL | Freq: Once | SUBCUTANEOUS | Status: DC

## 2013-08-22 MED ORDER — METFORMIN HCL ER 500 MG PO TB24: 500.0000 mg | ORAL_TABLET | Freq: Every day | ORAL | Status: DC

## 2013-08-22 MED ORDER — ALPRAZOLAM 1 MG PO TABS: 1.0000 mg | ORAL_TABLET | Freq: Three times a day (TID) | ORAL | Status: DC | PRN

## 2013-08-22 MED ORDER — HYDROCHLOROTHIAZIDE 12.5 MG PO CAPS: 12.5000 mg | ORAL_CAPSULE | Freq: Every day | ORAL | Status: DC

## 2013-08-22 NOTE — Progress Notes (Signed)
Subjective:    Patient ID: [name], female    DOB: 1946-02-17, 67 y.o.   MRN: [id number] This chart was scribed for Karen Koyanagi, MD by Rolanda Lundborg, ED Scribe. This patient was seen in room 25 and the patient's care was started at 1:02 PM.  Chief Complaint  Patient presents with  . Medication Refill    HPI HPI Comments: [name] is a 68 y.o. female who presents to the [facility] for a medication refill. She states her sugars have been up and down. She has been off of metformin for over a year. She saw Dr Nelva Bush at [facility] for her back pain. He gave her a nerve block in the spine but the needle caused her severe pain. She went back for another nerve block in December with [facility] at [facility] and reports no pain on injection. She reports significant relief since then. She reports anxiety due to kids and grandkids that live with her on and off. She joined the [employer] a few weeks ago. She states she is due for an eye exam. She denies being tested for osteoporosis recently. She thinks she is due for a colonoscopy. She states she had shingles years ago. She has not had the vaccine.   Patient Active Problem List   Diagnosis Date Noted  . HNP (herniated nucleus pulposus), thoracic 07/17/2013  . History of tobacco use 05/24/2013  . Depression with anxiety 05/24/2013  . Vitamin D deficiency 05/24/2013  . Venous insufficiency 05/24/2013  . H/O bilateral hip replacements 01/16/2013  . BMI 36.0-36.9,adult 11/19/2011  . DM (diabetes mellitus) 11/19/2011  . HTN (hypertension) 11/19/2011  . Asthma 11/19/2011   Current Outpatient Prescriptions on File Prior to Visit  Medication Sig Dispense Refill  . albuterol (PROVENTIL HFA;VENTOLIN HFA) 108 (90 BASE) MCG/ACT inhaler Inhale 1-2 puffs into the lungs every 4 (four) hours as needed for wheezing or shortness of breath.      . ALPRAZolam (XANAX) 1 MG tablet Take  1 tablet (1 mg total) by mouth 3 (three) times daily as needed for sleep or anxiety.  30 tablet  5  . amLODipine (NORVASC) 10 MG tablet Take 1 tablet (10 mg total) by mouth daily.  90 tablet  1  . atenolol (TENORMIN) 50 MG tablet Take 100 mg by mouth daily.      . hydrochlorothiazide (MICROZIDE) 12.5 MG capsule Take 1 capsule (12.5 mg total) by mouth daily.  90 capsule  1  . traMADol (ULTRAM) 50 MG tablet 1-2 q 6 h prn pain  30 tablet  0   No current facility-administered medications on file prior to visit.    Colonoscopy within the last 5 years No history of asthma problems recently or need for medication   Review of Systems  Constitutional: Negative for activity change, appetite change and fatigue.  Eyes: Negative for visual disturbance.  Respiratory: Negative for chest tightness and shortness of breath.   Cardiovascular: Negative for chest pain, palpitations and leg swelling.  Gastrointestinal: Negative for abdominal pain.  Genitourinary: Negative for difficulty urinating.  Musculoskeletal: Positive for arthralgias and myalgias.  Skin: Negative for rash.  Psychiatric/Behavioral: Negative for sleep disturbance and dysphoric mood. The patient is nervous/anxious.        Continues to have anxiety due to considerable family difficulties/son23, no job, out of prison, recently had to kick him out of the home Daughter with grandbaby living with her  Objective:   Physical Exam  Nursing note and vitals reviewed. Constitutional: She is oriented to person, place, and time. She appears well-developed and well-nourished. No distress.  HENT:  Head: Normocephalic and atraumatic.  Mouth/Throat: Oropharynx is clear and moist.  Eyes: Conjunctivae and EOM are normal. Pupils are equal, round, and reactive to light.  Neck: Normal range of motion. Neck supple. No thyromegaly present.  Cardiovascular: Normal rate, regular rhythm, normal heart sounds and intact distal pulses.   No murmur  heard. Pulmonary/Chest: Effort normal and breath sounds normal. No respiratory distress. She has no wheezes.  Musculoskeletal: She exhibits no edema.  Lymphadenopathy:    She has no cervical adenopathy.  Neurological: She is alert and oriented to person, place, and time. No cranial nerve deficit.  Skin: Skin is warm and dry.  Psychiatric: She has a normal mood and affect. Her behavior is normal. Judgment normal.    Filed Vitals:   08/22/13 1216  BP: 138/70  Pulse: 84  Temp: 98.2 F (36.8 C)  TempSrc: Oral  Resp: 16  Height: 5' 2.5" (1.588 m)  Weight: 202 lb 12.8 oz (91.989 kg)  SpO2: 94%    Results for orders placed in visit on 08/22/13  POCT GLYCOSYLATED HEMOGLOBIN (HGB A1C)      Result Value Ref Range   Hemoglobin A1C 7.7       Wt Readings from Last 3 Encounters:  08/22/13 202 lb 12.8 oz (91.989 kg)  05/24/13 209 lb (94.802 kg)  01/16/13 192 lb 6.4 oz (87.272 kg)        Assessment & Plan:  HTN (hypertension) - Plan: CBC, COMPLETE METABOLIC PANEL WITH GFR, Lipid panel, hydrochlorothiazide (MICROZIDE) 12.5 MG capsule, amLODipine (NORVASC) 10 MG tablet, Tenormin  Type II or unspecified type diabetes mellitus without mention of complication--no longer controlled/will restart medication   encouraged weight loss, increased activity  Dr. Ricki Miller does annual eye exams  Screening for osteoporosis - Plan: DG Bone Density  BMI 36.0-36.9,adult  Depression with anxiety--continue meds  Zostavax  Meds ordered this encounter  Medications  . metFORMIN (GLUCOPHAGE XR) 500 MG 24 hr tablet    Sig: Take 1 tablet (500 mg total) by mouth daily.    Dispense:  90 tablet    Refill:  1  . hydrochlorothiazide (MICROZIDE) 12.5 MG capsule    Sig: Take 1 capsule (12.5 mg total) by mouth daily.    Dispense:  90 capsule    Refill:  1  . atenolol (TENORMIN) 50 MG tablet    Sig: Take 2 tablets (100 mg total) by mouth daily.    Dispense:  180 tablet    Refill:  1  . amLODipine  (NORVASC) 10 MG tablet    Sig: Take 1 tablet (10 mg total) by mouth daily.    Dispense:  90 tablet    Refill:  1  . ALPRAZolam (XANAX) 1 MG tablet    Sig: Take 1 tablet (1 mg total) by mouth 3 (three) times daily as needed for sleep or anxiety.    Dispense:  90 tablet    Refill:  1  . zoster vaccine live, PF, (ZOSTAVAX) 72536 UNT/0.65ML injection    Sig: Inject 19,400 Units into the skin once. Administer at pharmacy    Dispense:  1 each    Refill:  0   consider adding a statin Urine microalbumin next visit Has not been able to tolerate ACE inhibitors or angiotensin receptor blockers     I have completed the patient encounter  in its entirety as documented by the scribe, with editing by me where necessary. Farheen Pfahler P. Laney Pastor, M.D.

## 2013-08-24 ENCOUNTER — Encounter: Payer: Self-pay | Admitting: Internal Medicine

## 2013-08-24 DIAGNOSIS — R7989 Other specified abnormal findings of blood chemistry: Secondary | ICD-10-CM | POA: Insufficient documentation

## 2013-08-24 DIAGNOSIS — R945 Abnormal results of liver function studies: Secondary | ICD-10-CM | POA: Insufficient documentation

## 2013-08-24 LAB — HEPATITIS C ANTIBODY: HCV Ab: NEGATIVE

## 2013-10-03 ENCOUNTER — Other Ambulatory Visit: Payer: Self-pay

## 2013-10-03 ENCOUNTER — Other Ambulatory Visit: Payer: Self-pay | Admitting: Internal Medicine

## 2013-10-03 DIAGNOSIS — E2839 Other primary ovarian failure: Secondary | ICD-10-CM

## 2013-10-03 DIAGNOSIS — Z1231 Encounter for screening mammogram for malignant neoplasm of breast: Secondary | ICD-10-CM

## 2013-10-04 ENCOUNTER — Encounter: Payer: Self-pay | Admitting: Gastroenterology

## 2013-10-17 ENCOUNTER — Ambulatory Visit
Admission: RE | Admit: 2013-10-17 | Discharge: 2013-10-17 | Disposition: A | Discharge status: Home / Self Care | Payer: Medicare Other | Source: Ambulatory Visit

## 2013-10-17 ENCOUNTER — Ambulatory Visit
Admission: RE | Admit: 2013-10-17 | Discharge: 2013-10-17 | Disposition: A | Discharge status: Home / Self Care | Payer: Medicare Other | Source: Ambulatory Visit | Attending: Internal Medicine | Admitting: Internal Medicine

## 2013-10-17 DIAGNOSIS — Z1231 Encounter for screening mammogram for malignant neoplasm of breast: Secondary | ICD-10-CM

## 2013-10-17 DIAGNOSIS — E2839 Other primary ovarian failure: Secondary | ICD-10-CM

## 2013-10-19 ENCOUNTER — Other Ambulatory Visit: Payer: Self-pay | Admitting: Internal Medicine

## 2013-10-19 DIAGNOSIS — R928 Other abnormal and inconclusive findings on diagnostic imaging of breast: Secondary | ICD-10-CM

## 2013-10-30 ENCOUNTER — Other Ambulatory Visit: Payer: Medicare Other

## 2013-11-07 ENCOUNTER — Ambulatory Visit
Admission: RE | Admit: 2013-11-07 | Discharge: 2013-11-07 | Disposition: A | Discharge status: Home / Self Care | Payer: Medicare Other | Source: Ambulatory Visit | Attending: Internal Medicine | Admitting: Internal Medicine

## 2013-11-07 ENCOUNTER — Encounter (INDEPENDENT_AMBULATORY_CARE_PROVIDER_SITE_OTHER): Payer: Self-pay

## 2013-11-07 ENCOUNTER — Other Ambulatory Visit: Payer: Self-pay | Admitting: Internal Medicine

## 2013-11-07 DIAGNOSIS — R928 Other abnormal and inconclusive findings on diagnostic imaging of breast: Secondary | ICD-10-CM

## 2013-11-09 ENCOUNTER — Ambulatory Visit
Admission: RE | Admit: 2013-11-09 | Discharge: 2013-11-09 | Disposition: A | Discharge status: Home / Self Care | Payer: Medicare Other | Source: Ambulatory Visit | Attending: Internal Medicine | Admitting: Internal Medicine

## 2013-11-09 ENCOUNTER — Other Ambulatory Visit: Payer: Self-pay | Admitting: Physician Assistant

## 2013-11-09 ENCOUNTER — Other Ambulatory Visit: Payer: Self-pay

## 2013-11-09 ENCOUNTER — Other Ambulatory Visit: Payer: Self-pay | Admitting: Internal Medicine

## 2013-11-09 DIAGNOSIS — R928 Other abnormal and inconclusive findings on diagnostic imaging of breast: Secondary | ICD-10-CM

## 2013-11-12 ENCOUNTER — Other Ambulatory Visit: Payer: Self-pay | Admitting: Internal Medicine

## 2013-11-12 DIAGNOSIS — C50919 Malignant neoplasm of unspecified site of unspecified female breast: Secondary | ICD-10-CM

## 2013-11-13 ENCOUNTER — Other Ambulatory Visit: Payer: Medicare Other

## 2013-11-14 ENCOUNTER — Ambulatory Visit (AMBULATORY_SURGERY_CENTER): Payer: Self-pay | Admitting: *Deleted

## 2013-11-14 VITALS — Ht 63.0 in | Wt 202.6 lb

## 2013-11-14 DIAGNOSIS — Z86010 Personal history of colonic polyps: Secondary | ICD-10-CM

## 2013-11-14 MED ORDER — MOVIPREP 100 G PO SOLR: ORAL | Status: DC

## 2013-11-14 NOTE — Progress Notes (Signed)
No allergies to eggs or soy. No problems with anesthesia.  Pt not given Emmi instructions for colonoscopy; no computer access  No oxygen use  No diet drug use  

## 2013-11-16 ENCOUNTER — Encounter: Payer: Self-pay | Admitting: *Deleted

## 2013-11-16 ENCOUNTER — Ambulatory Visit
Admission: RE | Admit: 2013-11-16 | Discharge: 2013-11-16 | Disposition: A | Discharge status: Home / Self Care | Payer: Medicare Other | Source: Ambulatory Visit | Attending: Internal Medicine | Admitting: Internal Medicine

## 2013-11-16 ENCOUNTER — Telehealth: Payer: Self-pay | Admitting: *Deleted

## 2013-11-16 DIAGNOSIS — C50212 Malignant neoplasm of upper-inner quadrant of left female breast: Secondary | ICD-10-CM | POA: Insufficient documentation

## 2013-11-16 DIAGNOSIS — C50919 Malignant neoplasm of unspecified site of unspecified female breast: Secondary | ICD-10-CM

## 2013-11-16 MED ORDER — GADOBENATE DIMEGLUMINE 529 MG/ML IV SOLN
20.0000 mL | Freq: Once | INTRAVENOUS | Status: AC | PRN
  Administered 2013-11-16: 20 mL via INTRAVENOUS

## 2013-11-16 NOTE — Telephone Encounter (Signed)
Confirmed [facility] for 11/21/13 at 0830 .  Instructions and contact information given. 

## 2013-11-19 ENCOUNTER — Other Ambulatory Visit: Payer: Self-pay | Admitting: Internal Medicine

## 2013-11-19 DIAGNOSIS — R928 Other abnormal and inconclusive findings on diagnostic imaging of breast: Secondary | ICD-10-CM

## 2013-11-21 ENCOUNTER — Ambulatory Visit (HOSPITAL_BASED_OUTPATIENT_CLINIC_OR_DEPARTMENT_OTHER): Payer: Medicare Other | Admitting: General Surgery

## 2013-11-21 ENCOUNTER — Ambulatory Visit
Admission: RE | Admit: 2013-11-21 | Discharge: 2013-11-21 | Disposition: A | Discharge status: Home / Self Care | Payer: Medicare Other | Source: Ambulatory Visit | Attending: Radiation Oncology | Admitting: Radiation Oncology

## 2013-11-21 ENCOUNTER — Encounter: Payer: Self-pay | Admitting: Gastroenterology

## 2013-11-21 ENCOUNTER — Encounter (INDEPENDENT_AMBULATORY_CARE_PROVIDER_SITE_OTHER): Payer: Self-pay | Admitting: General Surgery

## 2013-11-21 ENCOUNTER — Ambulatory Visit: Payer: Medicare Other | Attending: General Surgery | Admitting: Physical Therapy

## 2013-11-21 DIAGNOSIS — C50919 Malignant neoplasm of unspecified site of unspecified female breast: Secondary | ICD-10-CM | POA: Insufficient documentation

## 2013-11-21 DIAGNOSIS — IMO0001 Reserved for inherently not codable concepts without codable children: Secondary | ICD-10-CM | POA: Insufficient documentation

## 2013-11-21 DIAGNOSIS — C50212 Malignant neoplasm of upper-inner quadrant of left female breast: Secondary | ICD-10-CM

## 2013-11-21 DIAGNOSIS — C50219 Malignant neoplasm of upper-inner quadrant of unspecified female breast: Secondary | ICD-10-CM

## 2013-11-21 DIAGNOSIS — R293 Abnormal posture: Secondary | ICD-10-CM | POA: Insufficient documentation

## 2013-11-21 NOTE — Progress Notes (Signed)
Patient ID: [name], female   DOB: 1946/03/28, 68 y.o.   MRN: [id number]  No chief complaint on file.   HPI [name] is a 68 y.o. female.  We are asked to see the patient in consultation by Dr. Luberta Robertson to evaluate her for left breast cancer. The patient is a 68 year old black female who recently went for a routine screening mammogram. At that time she denied any breast pain. She does report that she will occasionally notice a small bit of clear discharge from her breast but this is unchanged. She has no personal or family history of breast cancer. 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 She was ER PR positive and HER-2/neu negative. Her Ki-67 was 15%. Her MRI estimated the size of the area at 1.3 cm but also showed some enhancement coming off the anterior portion of the ascending towards the nipple that encompassed an area of 8.8 cm. She is scheduled for an MRI guided biopsy of this tomorrow. She does not take any female hormones. She did quit smoking about 12 years ago.  HPI  Past Medical History  Diagnosis Date  . Allergy   . Arthritis   . Depression   . Asthma   . Diabetes mellitus without complication   . Hypertension     Past Surgical History  Procedure Laterality Date  . Abdominal hysterectomy  1981  . Tubal ligation  1973  . Total hip arthroplasty Left   . Total hip arthroplasty Right   . Orif shoulder fracture Left 1964  . Rotator cuff repair Right   . Ankle fracture surgery Left 03/15/2000    with hardware    Family History  Problem Relation Age of Onset  . Asthma Mother   . Stroke Mother   . Asthma Father   . Alcohol abuse Father   . Colon cancer Neg Hx     Social History History  Substance Use Topics  . Smoking status: Former Smoker    Quit date: 11/11/2000  . Smokeless tobacco: Never Used  . Alcohol Use: 2.0 oz/week    4 drink(s)  per week    Allergies  Allergen Reactions  . Ace Inhibitors Other (See Comments)    Acute renal failure  . Angiotensin Receptor Blockers Other (See Comments)    Acute Renal Failure    Current Outpatient Prescriptions  Medication Sig Dispense Refill  . albuterol (PROVENTIL HFA;VENTOLIN HFA) 108 (90 BASE) MCG/ACT inhaler Inhale 1-2 puffs into the lungs every 4 (four) hours as needed for wheezing or shortness of breath.      . ALPRAZolam (XANAX) 1 MG tablet Take 1 tablet (1 mg total) by mouth 3 (three) times daily as needed for sleep or anxiety.  90 tablet  1  . amLODipine (NORVASC) 10 MG tablet Take 1 tablet (10 mg total) by mouth daily.  90 tablet  1  . atenolol (TENORMIN) 50 MG tablet Take 2 tablets (100 mg total) by mouth daily.  180 tablet  1  . hydrochlorothiazide (MICROZIDE) 12.5 MG capsule Take 1 capsule (12.5 mg total) by mouth daily.  90 capsule  1  . metFORMIN (GLUCOPHAGE XR) 500 MG 24 hr tablet Take 1 tablet (500 mg total) by mouth daily.  90 tablet  1  . MOVIPREP 100 G SOLR moviprep as directed. No substitutions  1 kit  0  . sulfacetamide-prednisoLONE (VASOCIDIN)  10-0.23 % ophthalmic solution       . traMADol (ULTRAM) 50 MG tablet 1-2 q 6 h prn pain  30 tablet  0  . zoster vaccine live, PF, (ZOSTAVAX) 65035 UNT/0.65ML injection Inject 19,400 Units into the skin once. Administer at pharmacy  1 each  0   No current facility-administered medications for this visit.    Review of Systems Review of Systems  Constitutional: Negative.   HENT: Negative.   Eyes: Negative.   Respiratory: Negative.   Cardiovascular: Negative.   Gastrointestinal: Negative.   Endocrine: Negative.   Genitourinary: Negative.   Musculoskeletal: Negative.   Skin: Negative.   Allergic/Immunologic: Negative.   Neurological: Negative.   Hematological: Negative.   Psychiatric/Behavioral: Negative.     There were no vitals taken for this visit.  Physical Exam Physical Exam  Constitutional: She is  oriented to person, place, and time. She appears well-developed and well-nourished.  HENT:  Head: Normocephalic and atraumatic.  Eyes: Conjunctivae and EOM are normal. Pupils are equal, round, and reactive to light.  Neck: Normal range of motion. Neck supple.  Cardiovascular: Normal rate, regular rhythm and normal heart sounds.   Pulmonary/Chest: Effort normal and breath sounds normal.  There is no palpable mass in either breast. There is no palpable axillary, supraclavicular, or cervical lymphadenopathy.  Abdominal: Soft. Bowel sounds are normal.  Musculoskeletal: Normal range of motion.  Lymphadenopathy:    She has no cervical adenopathy.  Neurological: She is alert and oriented to person, place, and time.  Skin: Skin is warm and dry.  Psychiatric: She has a normal mood and affect. Her behavior is normal.    Data Reviewed As above  Assessment    The patient appears to have an area of invasive ductal cancer in the medial left breast. She also has a much larger area of enhancement heading towards the nipple. This is scheduled to be biopsied tomorrow. If this is negative then she would prefer breast conservation. If her biopsy is positive then she would require mastectomy and sentinel node mapping. I have discussed with her in detail the risks and benefits of these operations as well as some of the technical aspects and she understands and wishes to proceed     Plan    Plan for MRI guided biopsy tomorrow of the left breast enhancement. We will call her with the results of this biopsy and then proceed accordingly.        Luella Cook III 11/21/2013, 11:09 AM

## 2013-11-21 NOTE — Progress Notes (Signed)
Radiation Oncology         [phone]) [phone] ________________________________  Initial outpatient Consultation - Date: 11/21/2013   Name: [name] MRN: [id number]   DOB: 10/29/1945  REFERRING PHYSICIAN: Merrie Roof, MD  DIAGNOSIS: [id number] Invasive Ductal Carcinoma  HISTORY OF PRESENT ILLNESS::[name] is a 68 y.o. female  underwent a screening left mammogram. A left breast mass was noted in the lower inner quadrant. Ultrasound confirmed this finding 0.9 x 5 x 7 mm. This was a grade 1 invasive ductal carcinoma. This was ER/PR positive. MRI was performed which showed a 1.2 x 1.3 x 1.1 cm mass. Extending posterior from this mass however was 8 cm of non-masslike enhancement including a 6 x 7 mm mass. MRI guided biopsy was recommended and is scheduled for tomorrow morning. She's had very little soreness after her biopsy. She had no breast related complaints prior to her mammogram. No history of breast cancer radiation exposure. She presented unaccompanied today. She had menses at 11. She had her last period at age of 55. She has not menstruating. She is GX P1 with her first live birth at 12. She is birth control for 10 years. She's not using hormone replacement therapy. She has a colonoscopy scheduled for Wednesday of next week and was concerned about whether she would reschedule that or not.  PREVIOUS RADIATION THERAPY: No  PAST MEDICAL HISTORY:  has a past medical history of Allergy; Arthritis; Depression; Asthma; Diabetes mellitus without complication; and Hypertension.    PAST SURGICAL HISTORY: Past Surgical History  Procedure Laterality Date  . Abdominal hysterectomy  1981  . Tubal ligation  1973  . Total hip arthroplasty Left   . Total hip arthroplasty Right   . Orif shoulder fracture Left 1964  . Rotator cuff repair Right   . Ankle fracture surgery Left 03/15/2000    with hardware    FAMILY HISTORY:  Family History  Problem Relation Age of Onset  . Asthma  Mother   . Stroke Mother   . Asthma Father   . Alcohol abuse Father   . Colon cancer Neg Hx     SOCIAL HISTORY:  History  Substance Use Topics  . Smoking status: Former Smoker    Quit date: 11/11/2000  . Smokeless tobacco: Never Used  . Alcohol Use: 2.0 oz/week    4 drink(s) per week    ALLERGIES: Ace inhibitors and Angiotensin receptor blockers  MEDICATIONS:  Current Outpatient Prescriptions  Medication Sig Dispense Refill  . albuterol (PROVENTIL HFA;VENTOLIN HFA) 108 (90 BASE) MCG/ACT inhaler Inhale 1-2 puffs into the lungs every 4 (four) hours as needed for wheezing or shortness of breath.      . ALPRAZolam (XANAX) 1 MG tablet Take 1 tablet (1 mg total) by mouth 3 (three) times daily as needed for sleep or anxiety.  90 tablet  1  . amLODipine (NORVASC) 10 MG tablet Take 1 tablet (10 mg total) by mouth daily.  90 tablet  1  . atenolol (TENORMIN) 50 MG tablet Take 2 tablets (100 mg total) by mouth daily.  180 tablet  1  . hydrochlorothiazide (MICROZIDE) 12.5 MG capsule Take 1 capsule (12.5 mg total) by mouth daily.  90 capsule  1  . metFORMIN (GLUCOPHAGE XR) 500 MG 24 hr tablet Take 1 tablet (500 mg total) by mouth daily.  90 tablet  1  . MOVIPREP 100 G SOLR moviprep as directed. No substitutions  1 kit  0  . sulfacetamide-prednisoLONE (VASOCIDIN) 10-0.23 %  ophthalmic solution       . traMADol (ULTRAM) 50 MG tablet 1-2 q 6 h prn pain  30 tablet  0  . zoster vaccine live, PF, (ZOSTAVAX) 16384 UNT/0.65ML injection Inject 19,400 Units into the skin once. Administer at pharmacy  1 each  0   No current facility-administered medications for this encounter.    REVIEW OF SYSTEMS:  A 15 point review of systems is documented in the electronic medical record. This was obtained by the nursing staff. However, I reviewed this with the patient to discuss relevant findings and make appropriate changes.  Pertinent items are noted in HPI.  PHYSICAL EXAM:  Wasn't female in no distress sitting  comfortably examining table. She has ptotic breasts bilaterally. She has a sebaceous cyst palpable in her right axilla. No palpable normality is of right breast. Of the left breast there is some biopsy change associated with her incision from her biopsy on the left inner quadrant. As no palpable left axillary lymph nodes. No palpable axillary or supra-particular cervical adenopathy. She is alert and oriented x3.  LABORATORY DATA:  Lab Results  Component Value Date   WBC 3.5* 08/22/2013   HGB 14.1 08/22/2013   HCT 42.7 08/22/2013   MCV 92.8 08/22/2013   PLT 299 08/22/2013   Lab Results  Component Value Date   NA 137 08/22/2013   K 3.7 08/22/2013   CL 96 08/22/2013   CO2 30 08/22/2013   Lab Results  Component Value Date   ALT 42* 08/22/2013   AST 56* 08/22/2013   ALKPHOS 75 08/22/2013   BILITOT 0.7 08/22/2013     RADIOGRAPHY: Mr Breast Bilateral W Wo Contrast  11/16/2013   CLINICAL DATA:  68 year old female with recently diagnosed invasive ductal carcinoma of the left breast.  LABS:  BUN and creatinine were obtained on site at [facility] at  [address]  Results:  BUN 9 mg/dL,  Creatinine 1 mg/dL.  EXAM: BILATERAL BREAST MRI WITH AND WITHOUT CONTRAST  TECHNIQUE: Multiplanar, multisequence MR images of both breasts were obtained prior to and following the intravenous administration of 72m of MultiHance.  THREE-DIMENSIONAL MR IMAGE RENDERING ON INDEPENDENT WORKSTATION:  Three-dimensional MR images were rendered by post-processing of the original MR data on an independent workstation. The three-dimensional MR images were interpreted, and findings are reported in the following complete MRI report for this study. Three dimensional images were evaluated at the independent DynaCad workstation  COMPARISON:  Previous exams  FINDINGS: Breast composition: c:  Heterogeneous fibroglandular tissue  Background parenchymal enhancement: Mild to moderate.  Right breast: No suspicious rapidly enhancing  masses or abnormal areas of enhancement in the right breast to suggest malignancy.  Left breast: Irregular enhancing mass containing biopsy clip artifact in the lower inner left breast mid to posterior depth compatible with known biopsy proven malignancy measures 1.2 cm AP, 1.3 cm transverse, and 1.1 cm craniocaudal. There is irregular stippled enhancement extending from this mass towards the nipple spanning a total distance of 8.8 cm. Within the anterior extent of the stippled enhancement in the retroareolar/slightly inferior left breast there is an irregular enhancing mass measuring 0.6 x 0.7 x 0.7 cm. No definite skin or nipple involvement. No pectoralis involvement.  Lymph nodes: No morphologically abnormal axillary lymph nodes. No internal mammary lymphadenopathy.  Ancillary findings:  None.  IMPRESSION: 1. Biopsy proven malignancy in the lower inner left breast mid to posterior depth measures up to 1.3 cm. There is suspicious stippled enhancement extending from  this mass anteriorly towards the nipple with an additional suspicious enhancing retroareolar mass present, with the total extent (including known biopsy proven malignancy, stippled enhancement, and suspicious retroareolar mass) measuring up to 8.8 cm.  2. No MRI evidence of malignancy in the right breast.  RECOMMENDATION: 1. MRI guided biopsy of the suspicious mass in the retroareolar/slightly inferior left breast.  2. MRI guided biopsy of the suspicious irregular stippled enhancement in the medial left breast.  BI-RADS CATEGORY  4: Suspicious.   Electronically Signed   By: Everlean Alstrom M.D.   On: 11/16/2013 13:13   Mm Digital Diagnostic Unilat L  11/09/2013   CLINICAL DATA:  Patient is a 68 year old female status post ultrasound-guided core needle biopsy of an irregular mass in the medial left breast.  EXAM: DIAGNOSTIC LEFT MAMMOGRAM POST ULTRASOUND BIOPSY  COMPARISON:  Previous exams  FINDINGS: Mammographic images were obtained following  ultrasound guided biopsy of an irregular mass in the medial left breast. These demonstrate satisfactory positioning of the ribbon metal tissue marker at the biopsy site at 9 o'clock and focal post biopsy change.  IMPRESSION: Satisfactory positioning of a ribbon metal tissue marker status post biopsy of a mass in the medial left breast.  Final Assessment: Post Procedure Mammograms for Marker Placement   Electronically Signed   By: Andres Shad   On: 11/09/2013 15:17   Mm Digital Diagnostic Unilat L  11/07/2013   CLINICAL DATA:  Screening recall for a possible left breast mass.  EXAM: DIGITAL DIAGNOSTIC  LEFT MAMMOGRAM  ULTRASOUND LEFT BREAST  COMPARISON:  Previous exams.  ACR Breast Density Category c: The breast tissue is heterogeneously dense, which may obscure small masses.  FINDINGS: The initially questioned possible mass in the lower inner left breast appears irregular on the additional CC and MLO spot compression views, measuring approximately 8-9 mm.  Physical examination of the lower inner left breast does not reveal any palpable masses.  Targeted ultrasound of the left breast was performed demonstrating an irregular hypoechoic mass at 9 o'clock 7 cm from the nipple measuring 0.9 x 0.5 x 0.7 cm. This corresponds with mammography findings. No lymphadenopathy is seen in the left axilla.  IMPRESSION: Suspicious left breast mass.  RECOMMENDATION: Ultrasound-guided biopsy of the suspicious mass in the medial left breast is recommended. This is scheduled for Friday May 8th at 2 p.m.  I have discussed the findings and recommendations with the patient. Results were also provided in writing at the conclusion of the visit. If applicable, a reminder letter will be sent to the patient regarding the next appointment.  BI-RADS CATEGORY  4: Suspicious.   Electronically Signed   By: Everlean Alstrom M.D.   On: 11/07/2013 15:18   US Breast Ltd Uni Left Inc Axilla  11/07/2013   CLINICAL DATA:  Screening recall for a  possible left breast mass.  EXAM: DIGITAL DIAGNOSTIC  LEFT MAMMOGRAM  ULTRASOUND LEFT BREAST  COMPARISON:  Previous exams.  ACR Breast Density Category c: The breast tissue is heterogeneously dense, which may obscure small masses.  FINDINGS: The initially questioned possible mass in the lower inner left breast appears irregular on the additional CC and MLO spot compression views, measuring approximately 8-9 mm.  Physical examination of the lower inner left breast does not reveal any palpable masses.  Targeted ultrasound of the left breast was performed demonstrating an irregular hypoechoic mass at 9 o'clock 7 cm from the nipple measuring 0.9 x 0.5 x 0.7 cm. This corresponds with mammography findings. No  lymphadenopathy is seen in the left axilla.  IMPRESSION: Suspicious left breast mass.  RECOMMENDATION: Ultrasound-guided biopsy of the suspicious mass in the medial left breast is recommended. This is scheduled for Friday May 8th at 2 p.m.  I have discussed the findings and recommendations with the patient. Results were also provided in writing at the conclusion of the visit. If applicable, a reminder letter will be sent to the patient regarding the next appointment.  BI-RADS CATEGORY  4: Suspicious.   Electronically Signed   By: Everlean Alstrom M.D.   On: 11/07/2013 15:18   [place] Lt Breast Bx W Loc Dev 1st Lesion Img Bx Spec US Guide  11/12/2013   ADDENDUM REPORT: 11/12/2013 16:14  ADDENDUM: Grade 2 invasive ductal carcinoma was reported histologically. This corresponds well with the imaging findings. The patient was contacted by telephone and given the results of the biopsy. She states the biopsy site is clean and dry with no hematoma or signs of infection. The patient has been scheduled [facility] on 11/2013. Breast MRI has been arranged on 11/16/2013.   Electronically Signed   By: Lillia Mountain M.D.   On: 11/12/2013 16:14   11/12/2013   CLINICAL DATA:  Patient is a 68 year old female who  presents for ultrasound-guided core needle biopsy of a mass in the medial left breast.  EXAM: ULTRASOUND GUIDED LEFT BREAST CORE NEEDLE BIOPSY WITH VACUUM ASSIST  COMPARISON:  Previous exams.  PROCEDURE: I met with the patient and we discussed the procedure of ultrasound-guided biopsy, including benefits and alternatives. We discussed the high likelihood of a successful procedure. We discussed the risks of the procedure including infection, bleeding, tissue injury, clip migration, and inadequate sampling. Informed written consent was given. The usual time-out protocol was performed immediately prior to the procedure.  Using sterile technique and 2% Lidocaine as local anesthetic, under direct ultrasound visualization, a 12 gauge vacuum-assisteddevice was used to perform biopsy of an irregular mass at 9 o'clock in the left breast using a medial approach. At the conclusion of the procedure, a ribbon shaped tissue marker clip was deployed into the biopsy cavity. Follow-up 2-view mammogram was performed and dictated separately.  IMPRESSION: Ultrasound-guided biopsy of a mass in the medial left breast. No apparent complications.  Electronically Signed: By: Andres Shad On: 11/09/2013 15:52      IMPRESSION: T1cN0MX Invasive Ductal Carcinoma of the Left Breast (biopsy pending of additional disease)  PLAN: I spoke with Ms. Deardorff regarding her diagnosis and options for treatment. We discussed the need for a second biopsy to determine extent of disease. We discussed that if this biopsy was positive she would likely benefit from a mastectomy in terms of cosmesis given the large volume of breast tissue that had to be removed. We discussed if it was negative she would be a total for breast conservation. We discussed the role of radiation in both the postlumpectomy and postmastectomy setting. We discussed the need for Oncotype testing to determine whether she would be requiring chemotherapy. We discussed the logistics of  radiation including daily treatments. We discussed the possible side effects including fatigue skin redness and skin irritation.  I spent 60 minutes  face to face with the patient and more than 50% of that time was spent in counseling and/or coordination of care.   ------------------------------------------------  Thea Silversmith, MD

## 2013-11-22 ENCOUNTER — Ambulatory Visit
Admission: RE | Admit: 2013-11-22 | Discharge: 2013-11-22 | Disposition: A | Discharge status: Home / Self Care | Payer: Medicare Other | Source: Ambulatory Visit | Attending: Internal Medicine | Admitting: Internal Medicine

## 2013-11-22 DIAGNOSIS — R928 Other abnormal and inconclusive findings on diagnostic imaging of breast: Secondary | ICD-10-CM

## 2013-11-22 MED ORDER — GADOBENATE DIMEGLUMINE 529 MG/ML IV SOLN
20.0000 mL | Freq: Once | INTRAVENOUS | Status: AC | PRN
  Administered 2013-11-22: 20 mL via INTRAVENOUS

## 2013-11-23 ENCOUNTER — Other Ambulatory Visit (INDEPENDENT_AMBULATORY_CARE_PROVIDER_SITE_OTHER): Payer: Self-pay | Admitting: General Surgery

## 2013-11-23 DIAGNOSIS — C50912 Malignant neoplasm of unspecified site of left female breast: Secondary | ICD-10-CM

## 2013-11-27 ENCOUNTER — Encounter: Payer: Self-pay | Admitting: *Deleted

## 2013-11-27 NOTE — Progress Notes (Signed)
[place] Psychosocial Distress Screening Clinical Social Work  Patient completed distress screening protocol, and scored a 7 on the Psychosocial Distress Thermometer which indicates moderate distress. Clinical Social Worker met with pt in [facility] to assess for distress and other psychosocial needs.  Pt stated she was doing "ok", and felt "better" after getting more information on her treatment plan.  CSw informed pt of the support team and support services at [facility], and pt was agreeable to an [employer] referral.  CSW encouraged pt to call with any questions or concerns.    Johnnye Lana, MSW, [place] Worker [facility] [phone]

## 2013-11-28 ENCOUNTER — Telehealth (INDEPENDENT_AMBULATORY_CARE_PROVIDER_SITE_OTHER): Payer: Self-pay

## 2013-11-28 ENCOUNTER — Ambulatory Visit (AMBULATORY_SURGERY_CENTER): Payer: Medicare Other | Admitting: Gastroenterology

## 2013-11-28 ENCOUNTER — Encounter: Payer: Self-pay | Admitting: Gastroenterology

## 2013-11-28 VITALS — BP 145/75 | HR 76 | Temp 96.5°F | Resp 16 | Ht 63.0 in | Wt 202.0 lb

## 2013-11-28 DIAGNOSIS — D126 Benign neoplasm of colon, unspecified: Secondary | ICD-10-CM

## 2013-11-28 DIAGNOSIS — Z86010 Personal history of colonic polyps: Secondary | ICD-10-CM

## 2013-11-28 LAB — GLUCOSE, CAPILLARY
GLUCOSE-CAPILLARY: 180 mg/dL — AB (ref 70–99)
Glucose-Capillary: 172 mg/dL — ABNORMAL HIGH (ref 70–99)

## 2013-11-28 MED ORDER — SODIUM CHLORIDE 0.9 % IV SOLN: 500.0000 mL | INTRAVENOUS | Status: DC

## 2013-11-28 NOTE — Patient Instructions (Signed)
YOU HAD AN ENDOSCOPIC PROCEDURE TODAY AT THE [facility]: Refer to the procedure report that was given to you for any specific questions about what was found during the examination.  If the procedure report does not answer your questions, please call your gastroenterologist to clarify.  If you requested that your care partner not be given the details of your procedure findings, then the procedure report has been included in a sealed envelope for you to review at your convenience later.  YOU SHOULD EXPECT: Some feelings of bloating in the abdomen. Passage of more gas than usual.  Walking can help get rid of the air that was put into your GI tract during the procedure and reduce the bloating. If you had a lower endoscopy (such as a colonoscopy or flexible sigmoidoscopy) you may notice spotting of blood in your stool or on the toilet paper. If you underwent a bowel prep for your procedure, then you may not have a normal bowel movement for a few days.  DIET: Your first meal following the procedure should be a light meal and then it is ok to progress to your normal diet.  A half-sandwich or bowl of soup is an example of a good first meal.  Heavy or fried foods are harder to digest and may make you feel nauseous or bloated.  Likewise meals heavy in dairy and vegetables can cause extra gas to form and this can also increase the bloating.  Drink plenty of fluids but you should avoid alcoholic beverages for 24 hours.  ACTIVITY: Your care partner should take you home directly after the procedure.  You should plan to take it easy, moving slowly for the rest of the day.  You can resume normal activity the day after the procedure however you should NOT DRIVE or use heavy machinery for 24 hours (because of the sedation medicines used during the test).    SYMPTOMS TO REPORT IMMEDIATELY: A gastroenterologist can be reached at any hour.  During normal business hours, 8:30 AM to 5:00 PM Monday through Friday,  call [phone].  After hours and on weekends, please call the GI answering service at [phone] who will take a message and have the physician on call contact you.   Following lower endoscopy (colonoscopy or flexible sigmoidoscopy):  Excessive amounts of blood in the stool  Significant tenderness or worsening of abdominal pains  Swelling of the abdomen that is new, acute  Fever of 100F or higher    FOLLOW UP: If any biopsies were taken you will be contacted by phone or by letter within the next 1-3 weeks.  Call your gastroenterologist if you have not heard about the biopsies in 3 weeks.  Our staff will call the home number listed on your records the next business day following your procedure to check on you and address any questions or concerns that you may have at that time regarding the information given to you following your procedure. This is a courtesy call and so if there is no answer at the home number and we have not heard from you through the emergency physician on call, we will assume that you have returned to your regular daily activities without incident.  SIGNATURES/CONFIDENTIALITY: You and/or your care partner have signed paperwork which will be entered into your electronic medical record.  These signatures attest to the fact that that the information above on your After Visit Summary has been reviewed and is understood.  Full responsibility of the confidentiality   of this discharge information lies with you and/or your care-partner.     

## 2013-11-28 NOTE — Progress Notes (Signed)
Called to room to assist during endoscopic procedure.  Patient ID and intended procedure confirmed with present staff. Received instructions for my participation in the procedure from the performing physician.  

## 2013-11-28 NOTE — Telephone Encounter (Signed)
LMOM> I need to speak with pt when she calls back- [name]

## 2013-11-28 NOTE — Telephone Encounter (Signed)
Message copied by Carlene Coria on Wed Nov 28, 2013 10:41 AM ------      Message from: Salvatore Marvel      Created: Tue Nov 27, 2013  8:58 AM      Regarding: Dr. Loretha Brasil results      Contact: [phone]       Patient called and wants to talk with Dr. Marlou Starks about her Breast Cancer. She is at home in the morning but in the afternoon is going out.            Thank you. ------

## 2013-11-28 NOTE — Op Note (Signed)
[facility]  [address] [place], [id number]   COLONOSCOPY PROCEDURE REPORT  PATIENT: [name], [name]  MR#: [id number] BIRTHDATE: 11-11-1945 , 68  yrs. old GENDER: Female ENDOSCOPIST: Ladene Artist, MD, [facility] PROCEDURE DATE:  11/28/2013 PROCEDURE:   Colonoscopy with biopsy First Screening Colonoscopy - Avg.  risk and is 50 yrs.  old or older - No.  Prior Negative Screening - Now for repeat screening. N/A  History of Adenoma - Now for follow-up colonoscopy & has been > or = to 3 yrs.  Yes hx of adenoma.  Has been 3 or more years since last colonoscopy.  Polyps Removed Today? Yes. ASA CLASS:   Class II INDICATIONS:Patient's personal history of adenomatous colon polyps.  MEDICATIONS: MAC sedation, administered by CRNA and propofol (Diprivan) 250mg  IV DESCRIPTION OF PROCEDURE:   After the risks benefits and alternatives of the procedure were thoroughly explained, informed consent was obtained.  A digital rectal exam revealed no abnormalities of the rectum.   The LB [id number]  endoscope was introduced through the anus and advanced to the cecum, which was identified by both the appendix and ileocecal valve. No adverse events experienced.   The quality of the prep was good, using MoviPrep  The instrument was then slowly withdrawn as the colon was fully examined.    COLON FINDINGS: A sessile polyp measuring 4 mm in size was found in the ascending colon.  A polypectomy was performed with cold forceps.  The resection was complete and the polyp tissue was completely retrieved.   Mild diverticulosis was noted in the sigmoid colon.   The colon was otherwise normal.  There was no diverticulosis, inflammation, polyps or cancers unless previously stated.  Retroflexed views revealed small internal hemorrhoids. The time to cecum=2 minutes 47 seconds.  Withdrawal time=12 minutes 02 seconds.  The scope was withdrawn and the procedure completed. COMPLICATIONS: There  were no complications.      ENDOSCOPIC IMPRESSION: 1.   Sessile polyp measuring 4 mm in the ascending colon; polypectomy performed with cold forceps 2.   Mild diverticulosis in the sigmoid colon 3.   Small internal hemorrhoids  RECOMMENDATIONS: 1.  Await pathology results 2.  High fiber diet with liberal fluid intake. 3.  Repeat Colonoscopy in 5 years.   eSigned:  Ladene Artist, MD, [facility] [place] 11/28/2013 10:04 AM     PATIENT NAME:  [name], [name]. MR#: [id number]

## 2013-11-28 NOTE — Progress Notes (Signed)
Report to PACU, RN, vss, BBS= Clear.  

## 2013-11-28 NOTE — Telephone Encounter (Signed)
Please call patient back

## 2013-11-28 NOTE — Telephone Encounter (Signed)
Pt called back. She is understanding she needs a mastectomy with [facility]NB. Pt ready to schedule. Orders sent to schedulers.

## 2013-11-29 ENCOUNTER — Telehealth: Payer: Self-pay | Admitting: *Deleted

## 2013-11-29 NOTE — Telephone Encounter (Signed)
Spoke with patient from [facility] 11/21/13. No questions or concerns at this time she is just waiting on a surgery date.  Encouraged patient to call with any needs or concerns.

## 2013-11-29 NOTE — Telephone Encounter (Signed)
Message left

## 2013-12-06 ENCOUNTER — Other Ambulatory Visit (HOSPITAL_COMMUNITY): Payer: Medicare Other

## 2013-12-07 ENCOUNTER — Encounter: Payer: Self-pay | Admitting: Gastroenterology

## 2013-12-12 NOTE — Pre-Procedure Instructions (Signed)
[place]  12/12/2013   Your procedure is scheduled on:  12/20/13  Report to [facility] Admitting at 830 AM.  Call this number if you have problems the morning of surgery: [phone]   Remember:   Do not eat food or drink liquids after midnight.   Take these medicines the morning of surgery with A SIP OF WATER: all inhalers,xanax,atenolol,norvasc   Do not wear jewelry, make-up or nail polish.  Do not wear lotions, powders, or perfumes. You may wear deodorant.  Do not shave 48 hours prior to surgery. Men may shave face and neck.  Do not bring valuables to the hospital.  [facility] is not responsible                  for any belongings or valuables.               Contacts, dentures or bridgework may not be worn into surgery.  Leave suitcase in the car. After surgery it may be brought to your room.  For patients admitted to the hospital, discharge time is determined by your                treatment team.               Patients discharged the day of surgery will not be allowed to drive  home.  Name and phone number of your driver: driver  Special Instructions: Shower using CHG 2 nights before surgery and the night before surgery.  If you shower the day of surgery use CHG.  Use special wash - you have one bottle of CHG for all showers.  You should use approximately 1/3 of the bottle for each shower.   Please read over the following fact sheets that you were given: Pain Booklet, Coughing and Deep Breathing and Surgical Site Infection Prevention

## 2013-12-13 ENCOUNTER — Encounter (HOSPITAL_COMMUNITY): Payer: Self-pay

## 2013-12-13 ENCOUNTER — Encounter (HOSPITAL_COMMUNITY)
Admission: RE | Admit: 2013-12-13 | Discharge: 2013-12-13 | Disposition: A | Discharge status: Home / Self Care | Payer: Medicare Other | Source: Ambulatory Visit | Attending: General Surgery | Admitting: General Surgery

## 2013-12-13 DIAGNOSIS — Z01818 Encounter for other preprocedural examination: Secondary | ICD-10-CM | POA: Insufficient documentation

## 2013-12-13 DIAGNOSIS — Z01810 Encounter for preprocedural cardiovascular examination: Secondary | ICD-10-CM | POA: Insufficient documentation

## 2013-12-13 DIAGNOSIS — Z01812 Encounter for preprocedural laboratory examination: Secondary | ICD-10-CM | POA: Insufficient documentation

## 2013-12-13 HISTORY — DX: Anxiety disorder, unspecified: F41.9

## 2013-12-13 LAB — BASIC METABOLIC PANEL
BUN: 18 mg/dL (ref 6–23)
CHLORIDE: 101 meq/L (ref 96–112)
CO2: 28 meq/L (ref 19–32)
CREATININE: 0.88 mg/dL (ref 0.50–1.10)
Calcium: 10.8 mg/dL — ABNORMAL HIGH (ref 8.4–10.5)
GFR calc non Af Amer: 66 mL/min — ABNORMAL LOW (ref >90)
GFR, EST AFRICAN AMERICAN: 76 mL/min — AB (ref >90)
Glucose, Bld: 201 mg/dL — ABNORMAL HIGH (ref 70–99)
POTASSIUM: 4.4 meq/L (ref 3.7–5.3)
Sodium: 142 mEq/L (ref 137–147)

## 2013-12-13 LAB — CBC
HEMATOCRIT: 44.4 % (ref 36.0–46.0)
HEMOGLOBIN: 14.5 g/dL (ref 12.0–15.0)
MCH: 31.5 pg (ref 26.0–34.0)
MCHC: 32.7 g/dL (ref 30.0–36.0)
MCV: 96.5 fL (ref 78.0–100.0)
Platelets: 335 10*3/uL (ref 150–400)
RBC: 4.6 MIL/uL (ref 3.87–5.11)
RDW: 15.1 % (ref 11.5–15.5)
WBC: 6.7 10*3/uL (ref 4.0–10.5)

## 2013-12-19 MED ORDER — CEFAZOLIN SODIUM-DEXTROSE 2-3 GM-% IV SOLR
2.0000 g | INTRAVENOUS | Status: AC
  Administered 2013-12-20: 2 g via INTRAVENOUS
  Filled 2013-12-19: qty 50

## 2013-12-19 NOTE — Progress Notes (Signed)
Pt called regarding time change and the need to arrive at 0530 tomorrow with understanding verbalized. Pt reported that Dr. Ethlyn Gallery office had already called her.

## 2013-12-20 ENCOUNTER — Encounter (HOSPITAL_COMMUNITY): Payer: Medicare Other | Admitting: Certified Registered Nurse Anesthetist

## 2013-12-20 ENCOUNTER — Ambulatory Visit (HOSPITAL_COMMUNITY)
Admission: RE | Admit: 2013-12-20 | Discharge: 2013-12-22 | Disposition: A | Discharge status: Home / Self Care | Payer: Medicare Other | Source: Ambulatory Visit | Attending: General Surgery | Admitting: General Surgery

## 2013-12-20 ENCOUNTER — Encounter (HOSPITAL_COMMUNITY)
Admission: RE | Admit: 2013-12-20 | Discharge: 2013-12-20 | Disposition: A | Discharge status: Home / Self Care | Payer: Medicare Other | Source: Ambulatory Visit | Attending: General Surgery | Admitting: General Surgery

## 2013-12-20 ENCOUNTER — Encounter (HOSPITAL_COMMUNITY)
Admission: RE | Disposition: A | Discharge status: Home / Self Care | Payer: Self-pay | Source: Ambulatory Visit | Attending: General Surgery

## 2013-12-20 ENCOUNTER — Ambulatory Visit (HOSPITAL_COMMUNITY): Payer: Medicare Other | Admitting: Certified Registered Nurse Anesthetist

## 2013-12-20 ENCOUNTER — Encounter (HOSPITAL_COMMUNITY): Payer: Self-pay | Admitting: *Deleted

## 2013-12-20 DIAGNOSIS — E119 Type 2 diabetes mellitus without complications: Secondary | ICD-10-CM | POA: Insufficient documentation

## 2013-12-20 DIAGNOSIS — J45909 Unspecified asthma, uncomplicated: Secondary | ICD-10-CM | POA: Insufficient documentation

## 2013-12-20 DIAGNOSIS — Z87891 Personal history of nicotine dependence: Secondary | ICD-10-CM | POA: Insufficient documentation

## 2013-12-20 DIAGNOSIS — I10 Essential (primary) hypertension: Secondary | ICD-10-CM | POA: Insufficient documentation

## 2013-12-20 DIAGNOSIS — I739 Peripheral vascular disease, unspecified: Secondary | ICD-10-CM | POA: Insufficient documentation

## 2013-12-20 DIAGNOSIS — F3289 Other specified depressive episodes: Secondary | ICD-10-CM | POA: Insufficient documentation

## 2013-12-20 DIAGNOSIS — C50919 Malignant neoplasm of unspecified site of unspecified female breast: Secondary | ICD-10-CM | POA: Insufficient documentation

## 2013-12-20 DIAGNOSIS — F329 Major depressive disorder, single episode, unspecified: Secondary | ICD-10-CM | POA: Insufficient documentation

## 2013-12-20 DIAGNOSIS — C50219 Malignant neoplasm of upper-inner quadrant of unspecified female breast: Secondary | ICD-10-CM | POA: Diagnosis not present

## 2013-12-20 DIAGNOSIS — Z96649 Presence of unspecified artificial hip joint: Secondary | ICD-10-CM | POA: Insufficient documentation

## 2013-12-20 DIAGNOSIS — D0590 Unspecified type of carcinoma in situ of unspecified breast: Secondary | ICD-10-CM

## 2013-12-20 DIAGNOSIS — C50912 Malignant neoplasm of unspecified site of left female breast: Secondary | ICD-10-CM

## 2013-12-20 DIAGNOSIS — Z170 Estrogen receptor positive status [ER+]: Secondary | ICD-10-CM | POA: Insufficient documentation

## 2013-12-20 DIAGNOSIS — C50212 Malignant neoplasm of upper-inner quadrant of left female breast: Secondary | ICD-10-CM

## 2013-12-20 HISTORY — PX: MASTECTOMY W/ SENTINEL NODE BIOPSY: SHX2001

## 2013-12-20 LAB — GLUCOSE, CAPILLARY
Glucose-Capillary: 198 mg/dL — ABNORMAL HIGH (ref 70–99)
Glucose-Capillary: 206 mg/dL — ABNORMAL HIGH (ref 70–99)

## 2013-12-20 SURGERY — MASTECTOMY WITH SENTINEL LYMPH NODE BIOPSY
Anesthesia: Regional | Site: Breast | Laterality: Left

## 2013-12-20 MED ORDER — AMLODIPINE BESYLATE 10 MG PO TABS
10.0000 mg | ORAL_TABLET | Freq: Every day | ORAL | Status: DC
  Administered 2013-12-21 – 2013-12-22 (×2): 10 mg via ORAL
  Filled 2013-12-20 (×2): qty 1

## 2013-12-20 MED ORDER — HYDROCHLOROTHIAZIDE 12.5 MG PO CAPS
12.5000 mg | ORAL_CAPSULE | Freq: Every day | ORAL | Status: DC
  Administered 2013-12-20 – 2013-12-22 (×3): 12.5 mg via ORAL
  Filled 2013-12-20 (×3): qty 1

## 2013-12-20 MED ORDER — ALBUTEROL SULFATE (2.5 MG/3ML) 0.083% IN NEBU: 2.5000 mg | INHALATION_SOLUTION | RESPIRATORY_TRACT | Status: DC | PRN

## 2013-12-20 MED ORDER — PROMETHAZINE HCL 25 MG/ML IJ SOLN: 6.2500 mg | INTRAMUSCULAR | Status: DC | PRN

## 2013-12-20 MED ORDER — OXYCODONE HCL 5 MG PO TABS
5.0000 mg | ORAL_TABLET | ORAL | Status: DC | PRN
  Administered 2013-12-21 – 2013-12-22 (×4): 5 mg via ORAL
  Filled 2013-12-20 (×5): qty 1

## 2013-12-20 MED ORDER — KCL IN DEXTROSE-NACL 20-5-0.9 MEQ/L-%-% IV SOLN
INTRAVENOUS | Status: DC
  Administered 2013-12-20: 14:00:00 via INTRAVENOUS
  Filled 2013-12-20 (×2): qty 1000

## 2013-12-20 MED ORDER — METOCLOPRAMIDE HCL 5 MG/ML IJ SOLN
INTRAMUSCULAR | Status: AC
  Filled 2013-12-20: qty 2

## 2013-12-20 MED ORDER — ALPRAZOLAM 0.5 MG PO TABS: 1.0000 mg | ORAL_TABLET | Freq: Three times a day (TID) | ORAL | Status: DC | PRN

## 2013-12-20 MED ORDER — ROPIVACAINE HCL 5 MG/ML IJ SOLN
INTRAMUSCULAR | Status: DC | PRN
  Administered 2013-12-20: 40 mL via PERINEURAL

## 2013-12-20 MED ORDER — ONDANSETRON HCL 4 MG/2ML IJ SOLN
INTRAMUSCULAR | Status: DC | PRN
  Administered 2013-12-20: 4 mg via INTRAVENOUS

## 2013-12-20 MED ORDER — METFORMIN HCL ER 500 MG PO TB24
500.0000 mg | ORAL_TABLET | Freq: Every day | ORAL | Status: DC
  Administered 2013-12-21 – 2013-12-22 (×2): 500 mg via ORAL
  Filled 2013-12-20 (×3): qty 1

## 2013-12-20 MED ORDER — LACTATED RINGERS IV SOLN: INTRAVENOUS | Status: DC | PRN

## 2013-12-20 MED ORDER — FENTANYL CITRATE 0.05 MG/ML IJ SOLN
INTRAMUSCULAR | Status: AC
  Filled 2013-12-20: qty 5

## 2013-12-20 MED ORDER — ONDANSETRON HCL 4 MG/2ML IJ SOLN: 4.0000 mg | Freq: Four times a day (QID) | INTRAMUSCULAR | Status: DC | PRN

## 2013-12-20 MED ORDER — OXYCODONE HCL 5 MG/5ML PO SOLN: 5.0000 mg | Freq: Once | ORAL | Status: DC | PRN

## 2013-12-20 MED ORDER — ONDANSETRON HCL 4 MG PO TABS
4.0000 mg | ORAL_TABLET | Freq: Four times a day (QID) | ORAL | Status: DC | PRN
Start: 2013-12-20 — End: 2013-12-22

## 2013-12-20 MED ORDER — TECHNETIUM TC 99M SULFUR COLLOID FILTERED
1.0000 | Freq: Once | INTRAVENOUS | Status: AC | PRN
  Administered 2013-12-20: 1 via INTRADERMAL

## 2013-12-20 MED ORDER — OXYCODONE HCL 5 MG PO TABS: 5.0000 mg | ORAL_TABLET | Freq: Once | ORAL | Status: DC | PRN

## 2013-12-20 MED ORDER — SODIUM CHLORIDE 0.9 % IV SOLN
INTRAVENOUS | Status: DC | PRN
  Administered 2013-12-20: 07:00:00 via INTRAVENOUS

## 2013-12-20 MED ORDER — PHENYLEPHRINE 40 MCG/ML (10ML) SYRINGE FOR IV PUSH (FOR BLOOD PRESSURE SUPPORT)
PREFILLED_SYRINGE | INTRAVENOUS | Status: AC
  Filled 2013-12-20: qty 10

## 2013-12-20 MED ORDER — ARTIFICIAL TEARS OP OINT
TOPICAL_OINTMENT | OPHTHALMIC | Status: AC
  Filled 2013-12-20: qty 3.5

## 2013-12-20 MED ORDER — OXYCODONE-ACETAMINOPHEN 5-325 MG PO TABS: 1.0000 | ORAL_TABLET | ORAL | Status: DC | PRN

## 2013-12-20 MED ORDER — LIDOCAINE HCL (CARDIAC) 20 MG/ML IV SOLN
INTRAVENOUS | Status: AC
  Filled 2013-12-20: qty 5

## 2013-12-20 MED ORDER — ACETAMINOPHEN 325 MG PO TABS: 325.0000 mg | ORAL_TABLET | ORAL | Status: DC | PRN

## 2013-12-20 MED ORDER — ATENOLOL 100 MG PO TABS
100.0000 mg | ORAL_TABLET | Freq: Every day | ORAL | Status: DC
  Administered 2013-12-21 – 2013-12-22 (×2): 100 mg via ORAL
  Filled 2013-12-20 (×2): qty 1

## 2013-12-20 MED ORDER — LIDOCAINE HCL (CARDIAC) 10 MG/ML IV SOLN
INTRAVENOUS | Status: DC | PRN
  Administered 2013-12-20: 80 mg via INTRAVENOUS

## 2013-12-20 MED ORDER — 0.9 % SODIUM CHLORIDE (POUR BTL) OPTIME
TOPICAL | Status: DC | PRN
  Administered 2013-12-20 (×2): 1000 mL

## 2013-12-20 MED ORDER — MIDAZOLAM HCL 2 MG/2ML IJ SOLN
INTRAMUSCULAR | Status: AC
  Filled 2013-12-20: qty 2

## 2013-12-20 MED ORDER — PHENYLEPHRINE HCL 10 MG/ML IJ SOLN
INTRAMUSCULAR | Status: DC | PRN
  Administered 2013-12-20 (×3): 80 ug via INTRAVENOUS
  Administered 2013-12-20: 120 ug via INTRAVENOUS

## 2013-12-20 MED ORDER — SODIUM CHLORIDE 0.9 % IJ SOLN
INTRAMUSCULAR | Status: DC | PRN
  Administered 2013-12-20: 08:00:00 via INTRAMUSCULAR

## 2013-12-20 MED ORDER — FENTANYL CITRATE 0.05 MG/ML IJ SOLN
INTRAMUSCULAR | Status: DC | PRN
  Administered 2013-12-20: 25 ug via INTRAVENOUS
  Administered 2013-12-20: 50 ug via INTRAVENOUS
  Administered 2013-12-20 (×2): 25 ug via INTRAVENOUS
  Administered 2013-12-20 (×2): 50 ug via INTRAVENOUS
  Administered 2013-12-20: 25 ug via INTRAVENOUS

## 2013-12-20 MED ORDER — OXYCODONE-ACETAMINOPHEN 10-325 MG PO TABS: 1.0000 | ORAL_TABLET | ORAL | Status: DC | PRN

## 2013-12-20 MED ORDER — ROCURONIUM BROMIDE 50 MG/5ML IV SOLN
INTRAVENOUS | Status: AC
  Filled 2013-12-20: qty 1

## 2013-12-20 MED ORDER — HEPARIN SODIUM (PORCINE) 5000 UNIT/ML IJ SOLN
5000.0000 [IU] | Freq: Three times a day (TID) | INTRAMUSCULAR | Status: DC
  Administered 2013-12-21 – 2013-12-22 (×4): 5000 [IU] via SUBCUTANEOUS
  Filled 2013-12-20 (×7): qty 1

## 2013-12-20 MED ORDER — FENTANYL CITRATE 0.05 MG/ML IJ SOLN
INTRAMUSCULAR | Status: AC
  Filled 2013-12-20: qty 2

## 2013-12-20 MED ORDER — METHYLENE BLUE 1 % INJ SOLN
INTRAMUSCULAR | Status: AC
  Filled 2013-12-20: qty 10

## 2013-12-20 MED ORDER — SULFACETAMIDE-PREDNISOLONE 10-0.23 % OP SOLN: 1.0000 [drp] | Freq: Every day | OPHTHALMIC | Status: DC | PRN

## 2013-12-20 MED ORDER — SODIUM CHLORIDE 0.9 % IJ SOLN
INTRAMUSCULAR | Status: AC
  Filled 2013-12-20: qty 10

## 2013-12-20 MED ORDER — METOCLOPRAMIDE HCL 5 MG/ML IJ SOLN
INTRAMUSCULAR | Status: DC | PRN
  Administered 2013-12-20: 10 mg via INTRAVENOUS

## 2013-12-20 MED ORDER — MORPHINE SULFATE 4 MG/ML IJ SOLN
4.0000 mg | INTRAMUSCULAR | Status: DC | PRN
  Administered 2013-12-21 (×2): 4 mg via INTRAVENOUS
  Filled 2013-12-20 (×2): qty 1

## 2013-12-20 MED ORDER — ONDANSETRON HCL 4 MG/2ML IJ SOLN
INTRAMUSCULAR | Status: AC
  Filled 2013-12-20: qty 2

## 2013-12-20 MED ORDER — FENTANYL CITRATE 0.05 MG/ML IJ SOLN
25.0000 ug | INTRAMUSCULAR | Status: DC | PRN
  Administered 2013-12-20 (×2): 25 ug via INTRAVENOUS

## 2013-12-20 MED ORDER — PROPOFOL 10 MG/ML IV BOLUS
INTRAVENOUS | Status: DC | PRN
  Administered 2013-12-20: 170 mg via INTRAVENOUS

## 2013-12-20 MED ORDER — ACETAMINOPHEN 160 MG/5ML PO SOLN
325.0000 mg | ORAL | Status: DC | PRN
Start: 2013-12-20 — End: 2013-12-20
  Filled 2013-12-20: qty 20.3

## 2013-12-20 MED ORDER — MIDAZOLAM HCL 5 MG/5ML IJ SOLN
INTRAMUSCULAR | Status: DC | PRN
  Administered 2013-12-20: 2 mg via INTRAVENOUS

## 2013-12-20 MED ORDER — ALBUTEROL SULFATE HFA 108 (90 BASE) MCG/ACT IN AERS: 1.0000 | INHALATION_SPRAY | RESPIRATORY_TRACT | Status: DC | PRN

## 2013-12-20 MED ORDER — PROPOFOL 10 MG/ML IV BOLUS
INTRAVENOUS | Status: AC
  Filled 2013-12-20: qty 20

## 2013-12-20 SURGICAL SUPPLY — 59 items
ADH SKN CLS APL DERMABOND .7 (GAUZE/BANDAGES/DRESSINGS) ×2
APPLIER CLIP 9.375 MED OPEN (MISCELLANEOUS) ×2
APR CLP MED 9.3 20 MLT OPN (MISCELLANEOUS) ×1
BINDER BREAST LRG (GAUZE/BANDAGES/DRESSINGS) IMPLANT
BINDER BREAST XLRG (GAUZE/BANDAGES/DRESSINGS) IMPLANT
BINDER BREAST XXLRG (GAUZE/BANDAGES/DRESSINGS) ×1 IMPLANT
BIOPATCH RED 1 DISK 7.0 (GAUZE/BANDAGES/DRESSINGS) ×2 IMPLANT
BLADE SURG ROTATE 9660 (MISCELLANEOUS) ×1 IMPLANT
CANISTER SUCTION 2500CC (MISCELLANEOUS) ×3 IMPLANT
CHLORAPREP W/TINT 26ML (MISCELLANEOUS) ×2 IMPLANT
CLIP APPLIE 9.375 MED OPEN (MISCELLANEOUS) IMPLANT
CONT SPEC 4OZ CLIKSEAL STRL BL (MISCELLANEOUS) ×2 IMPLANT
COVER PROBE W GEL 5X96 (DRAPES) ×2 IMPLANT
COVER SURGICAL LIGHT HANDLE (MISCELLANEOUS) ×2 IMPLANT
DERMABOND ADVANCED (GAUZE/BANDAGES/DRESSINGS) ×2
DERMABOND ADVANCED .7 DNX12 (GAUZE/BANDAGES/DRESSINGS) ×1 IMPLANT
DEVICE DISSECT PLASMABLAD 3.0S (MISCELLANEOUS) IMPLANT
DRAIN CHANNEL 19F RND (DRAIN) ×3 IMPLANT
DRAPE LAPAROSCOPIC ABDOMINAL (DRAPES) ×2 IMPLANT
DRAPE PROXIMA HALF (DRAPES) ×1 IMPLANT
DRAPE UTILITY 15X26 W/TAPE STR (DRAPE) ×4 IMPLANT
DRSG PAD ABDOMINAL 8X10 ST (GAUZE/BANDAGES/DRESSINGS) ×2 IMPLANT
DRSG TEGADERM 4X4.75 (GAUZE/BANDAGES/DRESSINGS) ×1 IMPLANT
ELECT CAUTERY BLADE 6.4 (BLADE) ×2 IMPLANT
ELECT REM PT RETURN 9FT ADLT (ELECTROSURGICAL) ×2
ELECTRODE REM PT RTRN 9FT ADLT (ELECTROSURGICAL) ×1 IMPLANT
EVACUATOR SILICONE 100CC (DRAIN) ×3 IMPLANT
GAUZE XEROFORM 5X9 LF (GAUZE/BANDAGES/DRESSINGS) ×1 IMPLANT
GLOVE BIO SURGEON STRL SZ7.5 (GLOVE) ×5 IMPLANT
GLOVE BIOGEL PI IND STRL 6.5 (GLOVE) IMPLANT
GLOVE BIOGEL PI IND STRL 7.5 (GLOVE) IMPLANT
GLOVE BIOGEL PI INDICATOR 6.5 (GLOVE) ×1
GLOVE BIOGEL PI INDICATOR 7.5 (GLOVE) ×3
GLOVE SURG SS PI 6.5 STRL IVOR (GLOVE) ×2 IMPLANT
GOWN PREVENTION PLUS XLARGE (GOWN DISPOSABLE) ×1 IMPLANT
GOWN STRL REUS W/ TWL LRG LVL3 (GOWN DISPOSABLE) ×2 IMPLANT
GOWN STRL REUS W/TWL LRG LVL3 (GOWN DISPOSABLE) ×8
KIT BASIN OR (CUSTOM PROCEDURE TRAY) ×2 IMPLANT
KIT ROOM TURNOVER OR (KITS) ×2 IMPLANT
NDL 18GX1X1/2 (RX/OR ONLY) (NEEDLE) ×1 IMPLANT
NDL HYPO 25GX1X1/2 BEV (NEEDLE) ×1 IMPLANT
NEEDLE 18GX1X1/2 (RX/OR ONLY) (NEEDLE) ×2 IMPLANT
NEEDLE HYPO 25GX1X1/2 BEV (NEEDLE) ×2 IMPLANT
NS IRRIG 1000ML POUR BTL (IV SOLUTION) ×3 IMPLANT
PACK GENERAL/GYN (CUSTOM PROCEDURE TRAY) ×2 IMPLANT
PAD ARMBOARD 7.5X6 YLW CONV (MISCELLANEOUS) ×2 IMPLANT
PLASMABLADE 3.0S (MISCELLANEOUS) ×2
SPECIMEN JAR X LARGE (MISCELLANEOUS) ×2 IMPLANT
SPONGE GAUZE 4X4 12PLY (GAUZE/BANDAGES/DRESSINGS) ×2 IMPLANT
SPONGE LAP 18X18 X RAY DECT (DISPOSABLE) ×1 IMPLANT
SUT ETHILON 3 0 FSL (SUTURE) ×3 IMPLANT
SUT MON AB 4-0 PC3 18 (SUTURE) ×2 IMPLANT
SUT VIC AB 3-0 54X BRD REEL (SUTURE) IMPLANT
SUT VIC AB 3-0 BRD 54 (SUTURE) ×2
SUT VIC AB 3-0 SH 18 (SUTURE) ×2 IMPLANT
SYR CONTROL 10ML LL (SYRINGE) ×2 IMPLANT
TOWEL OR 17X24 6PK STRL BLUE (TOWEL DISPOSABLE) ×2 IMPLANT
TOWEL OR 17X26 10 PK STRL BLUE (TOWEL DISPOSABLE) ×2 IMPLANT
TOWEL OR NON WOVEN STRL DISP B (DISPOSABLE) ×1 IMPLANT

## 2013-12-20 NOTE — Interval H&P Note (Signed)
History and Physical Interval Note:  12/20/2013 7:39 AM  [name]  has presented today for surgery, with the diagnosis of LEFT BREAST CANCER   The various methods of treatment have been discussed with the patient and family. After consideration of risks, benefits and other options for treatment, the patient has consented to  Procedure(s): MASTECTOMY WITH SENTINEL LYMPH NODE BIOPSY (Left) as a surgical intervention .  The patient's history has been reviewed, patient examined, no change in status, stable for surgery.  I have reviewed the patient's chart and labs.  Questions were answered to the patient's satisfaction.     TOTH III,PAUL S

## 2013-12-20 NOTE — Anesthesia Procedure Notes (Signed)
Anesthesia Regional Block:  Pectoralis block  Pre-Anesthetic Checklist: ,, timeout performed, Correct Patient, Correct Site, Correct Laterality, Correct Procedure, Correct Position, risks and benefits discussed, surgical consent, pre-op evaluation,  At surgeon's request and post-op pain management  Laterality: N/A and Left  Prep: chloraprep       Needles:  Injection technique: Single-shot  Needle Type: Echogenic Needle          Additional Needles:  Procedures: ultrasound guided (picture in chart) Pectoralis block Narrative:  Start time: 12/20/2013 7:18 AM End time: 12/20/2013 7:28 AM Injection made incrementally with aspirations every 5 mL.  Performed by: Personally  Anesthesiologist: Moser  Additional Notes: H+P and labs reviewed, risks and benefits discussed with patient, procedure tolerated well without complications

## 2013-12-20 NOTE — H&P (View-Only) (Signed)
Patient ID: [name], female   DOB: 1946/03/28, 68 y.o.   MRN: [id number]  No chief complaint on file.   HPI [name] is a 68 y.o. female.  We are asked to see the patient in consultation by Dr. Luberta Robertson to evaluate her for left breast cancer. The patient is a 68 year old black female who recently went for a routine screening mammogram. At that time she denied any breast pain. She does report that she will occasionally notice a small bit of clear discharge from her breast but this is unchanged. She has no personal or family history of breast cancer. 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 She was ER PR positive and HER-2/neu negative. Her Ki-67 was 15%. Her MRI estimated the size of the area at 1.3 cm but also showed some enhancement coming off the anterior portion of the ascending towards the nipple that encompassed an area of 8.8 cm. She is scheduled for an MRI guided biopsy of this tomorrow. She does not take any female hormones. She did quit smoking about 12 years ago.  HPI  Past Medical History  Diagnosis Date  . Allergy   . Arthritis   . Depression   . Asthma   . Diabetes mellitus without complication   . Hypertension     Past Surgical History  Procedure Laterality Date  . Abdominal hysterectomy  1981  . Tubal ligation  1973  . Total hip arthroplasty Left   . Total hip arthroplasty Right   . Orif shoulder fracture Left 1964  . Rotator cuff repair Right   . Ankle fracture surgery Left 03/15/2000    with hardware    Family History  Problem Relation Age of Onset  . Asthma Mother   . Stroke Mother   . Asthma Father   . Alcohol abuse Father   . Colon cancer Neg Hx     Social History History  Substance Use Topics  . Smoking status: Former Smoker    Quit date: 11/11/2000  . Smokeless tobacco: Never Used  . Alcohol Use: 2.0 oz/week    4 drink(s)  per week    Allergies  Allergen Reactions  . Ace Inhibitors Other (See Comments)    Acute renal failure  . Angiotensin Receptor Blockers Other (See Comments)    Acute Renal Failure    Current Outpatient Prescriptions  Medication Sig Dispense Refill  . albuterol (PROVENTIL HFA;VENTOLIN HFA) 108 (90 BASE) MCG/ACT inhaler Inhale 1-2 puffs into the lungs every 4 (four) hours as needed for wheezing or shortness of breath.      . ALPRAZolam (XANAX) 1 MG tablet Take 1 tablet (1 mg total) by mouth 3 (three) times daily as needed for sleep or anxiety.  90 tablet  1  . amLODipine (NORVASC) 10 MG tablet Take 1 tablet (10 mg total) by mouth daily.  90 tablet  1  . atenolol (TENORMIN) 50 MG tablet Take 2 tablets (100 mg total) by mouth daily.  180 tablet  1  . hydrochlorothiazide (MICROZIDE) 12.5 MG capsule Take 1 capsule (12.5 mg total) by mouth daily.  90 capsule  1  . metFORMIN (GLUCOPHAGE XR) 500 MG 24 hr tablet Take 1 tablet (500 mg total) by mouth daily.  90 tablet  1  . MOVIPREP 100 G SOLR moviprep as directed. No substitutions  1 kit  0  . sulfacetamide-prednisoLONE (VASOCIDIN)  10-0.23 % ophthalmic solution       . traMADol (ULTRAM) 50 MG tablet 1-2 q 6 h prn pain  30 tablet  0  . zoster vaccine live, PF, (ZOSTAVAX) 65035 UNT/0.65ML injection Inject 19,400 Units into the skin once. Administer at pharmacy  1 each  0   No current facility-administered medications for this visit.    Review of Systems Review of Systems  Constitutional: Negative.   HENT: Negative.   Eyes: Negative.   Respiratory: Negative.   Cardiovascular: Negative.   Gastrointestinal: Negative.   Endocrine: Negative.   Genitourinary: Negative.   Musculoskeletal: Negative.   Skin: Negative.   Allergic/Immunologic: Negative.   Neurological: Negative.   Hematological: Negative.   Psychiatric/Behavioral: Negative.     There were no vitals taken for this visit.  Physical Exam Physical Exam  Constitutional: She is  oriented to person, place, and time. She appears well-developed and well-nourished.  HENT:  Head: Normocephalic and atraumatic.  Eyes: Conjunctivae and EOM are normal. Pupils are equal, round, and reactive to light.  Neck: Normal range of motion. Neck supple.  Cardiovascular: Normal rate, regular rhythm and normal heart sounds.   Pulmonary/Chest: Effort normal and breath sounds normal.  There is no palpable mass in either breast. There is no palpable axillary, supraclavicular, or cervical lymphadenopathy.  Abdominal: Soft. Bowel sounds are normal.  Musculoskeletal: Normal range of motion.  Lymphadenopathy:    She has no cervical adenopathy.  Neurological: She is alert and oriented to person, place, and time.  Skin: Skin is warm and dry.  Psychiatric: She has a normal mood and affect. Her behavior is normal.    Data Reviewed As above  Assessment    The patient appears to have an area of invasive ductal cancer in the medial left breast. She also has a much larger area of enhancement heading towards the nipple. This is scheduled to be biopsied tomorrow. If this is negative then she would prefer breast conservation. If her biopsy is positive then she would require mastectomy and sentinel node mapping. I have discussed with her in detail the risks and benefits of these operations as well as some of the technical aspects and she understands and wishes to proceed     Plan    Plan for MRI guided biopsy tomorrow of the left breast enhancement. We will call her with the results of this biopsy and then proceed accordingly.        Luella Cook III 11/21/2013, 11:09 AM

## 2013-12-20 NOTE — Anesthesia Preprocedure Evaluation (Addendum)
Anesthesia Evaluation  Patient identified by MRN, date of birth, ID band Patient awake    Reviewed: Allergy & Precautions, H&P , NPO status , Patient's Chart, lab work & pertinent test results, reviewed documented beta blocker date and time   History of Anesthesia Complications Negative for: history of anesthetic complications  Airway Mallampati: II TM Distance: >3 FB Neck ROM: Full    Dental  (+) Dental Advisory Given, Edentulous Lower   Pulmonary asthma , former smoker,          Cardiovascular hypertension, Pt. on medications and Pt. on home beta blockers + Peripheral Vascular Disease Rhythm:Regular     Neuro/Psych PSYCHIATRIC DISORDERS Anxiety Depression negative neurological ROS     GI/Hepatic negative GI ROS, Neg liver ROS,   Endo/Other  diabetes, Well Controlled, Type 2, Oral Hypoglycemic Agents  Renal/GU negative Renal ROS     Musculoskeletal negative musculoskeletal ROS (+)   Abdominal   Peds  Hematology   Anesthesia Other Findings   Reproductive/Obstetrics negative OB ROS                          Anesthesia Physical Anesthesia Plan  ASA: III  Anesthesia Plan: General and Regional   Post-op Pain Management:    Induction: Intravenous  Airway Management Planned: LMA  Additional Equipment: None  Intra-op Plan:   Post-operative Plan: Extubation in OR  Informed Consent: I have reviewed the patients History and Physical, chart, labs and discussed the procedure including the risks, benefits and alternatives for the proposed anesthesia with the patient or authorized representative who has indicated his/her understanding and acceptance.   Dental advisory given  Plan Discussed with: CRNA and Surgeon  Anesthesia Plan Comments:        Anesthesia Quick Evaluation

## 2013-12-20 NOTE — Anesthesia Postprocedure Evaluation (Signed)
  Anesthesia Post-op Note  Patient: [name]  Procedure(s) Performed: Procedure(s): MASTECTOMY WITH SENTINEL LYMPH NODE BIOPSY (Left)  Patient Location: PACU  Anesthesia Type:General and Regional  Level of Consciousness: awake and alert   Airway and Oxygen Therapy: Patient Spontanous Breathing  Post-op Pain: mild  Post-op Assessment: Post-op Vital signs reviewed, Patient's Cardiovascular Status Stable, Respiratory Function Stable, Patent Airway, No signs of Nausea or vomiting and Pain level controlled  Post-op Vital Signs: Reviewed and stable  Last Vitals:  Filed Vitals:   12/20/13 1117  BP: 105/56  Pulse: 80  Temp: 36.7 C  Resp: 14    Complications: No apparent anesthesia complications

## 2013-12-20 NOTE — Op Note (Signed)
12/20/2013  9:51 AM  PATIENT:  [name]  68 y.o. female  PRE-OPERATIVE DIAGNOSIS:  LEFT BREAST CANCER   POST-OPERATIVE DIAGNOSIS:  LEFT BREAST CANCER   PROCEDURE:  Procedure(s): MASTECTOMY WITH SENTINEL LYMPH NODE BIOPSY (Left)  SURGEON:  Surgeon(s) and Role:    * Merrie Roof, MD - Primary  PHYSICIAN ASSISTANT:   ASSISTANTS: none   ANESTHESIA:   general  EBL:  Total I/O In: 400 [I.V.:400] Out: -   BLOOD ADMINISTERED:none  DRAINS: (2) Jackson-Pratt drain(s) with closed bulb suction in the prepectoral space   LOCAL MEDICATIONS USED:  NONE  SPECIMEN:  Source of Specimen:  left mastectomy and sentinel node  DISPOSITION OF SPECIMEN:  PATHOLOGY  COUNTS:  YES  TOURNIQUET:  * No tourniquets in log *  DICTATION: .Dragon Dictation After informed consent was obtained the patient was brought to the operating room placed in a position on the operating room table. After adequate induction of general anesthesia the patient's left chest, breast, and axillary area were prepped with ChloraPrep, allowed to dry, and draped in usual sterile manner. Earlier in the day the patient underwent injection of 1 mCi of technetium sulfur colloid in the subareolar position on the left. At this point, 2 cc of methylene blue and 3 cc of injectable saline were also injected in the subareolar position on the left and the breast was massaged for several minutes. Next an elliptical incision was made around the nipple and areola complex to minimize the excess skin With a 10 blade knife. The incision was carried through the skin and subcutaneous tissue sharply with the plasma blade. Breast hooks were used to elevate the skin flaps anteriorly towards the ceiling. Thin skin flaps were created between the breast tissue and subcutaneous fat circumferentially using the plasma blade. This dissection was carried all the way to the chest wall. Laterally at the edge of the axilla we were able to identify the hot  spot with the neoprobe. Blunt dissection was carried out in this area until a hot blue lymph node was identified. This was excised sharply with the plasma blade. Ex vivo counts on the sentinel node were approximately 1000. Touch preps on this node were negative. Next the breast was removed from the chest wall with the pectoralis fascia. This was also done sharply with the plasma blade. Once the breast was removed it was oriented with a short stitch superior and a long stitch lateral and then sent to pathology. Hemostasis was achieved using the plasma blade. The wound was Irrigated with Copious amounts of saline. 2 small stab incisions were made inferior to the operative area along the anterior axillary line. A tonsil clamp was placed through each of these openings to bring a 19 [place] round Blake drains into the operative bed. The medial drain was curled along the chest wall and the lateral drain was placed in the axilla. The drains were anchored to the skin with a 3-0 nylon stitch. The superior and inferior flaps were then grossly reapproximated with 3-0 Vicryl stitches. The skin was then closed with a running 4-0 Monocryl subcuticular stitch. Dermabond dressings were applied. The patient tolerated the procedure well. At the end of the case all needle sponge and instrument counts were correct. The patient was then awakened and taken to recovery in stable condition. The drains were placed to bulb suction and there was a good seal.  PLAN OF CARE: Admit for overnight observation  PATIENT DISPOSITION:  PACU - hemodynamically stable.  Delay start of Pharmacological VTE agent (>24hrs) due to surgical blood loss or risk of bleeding: no

## 2013-12-20 NOTE — Transfer of Care (Signed)
Immediate Anesthesia Transfer of Care Note  Patient: [name]  Procedure(s) Performed: Procedure(s): MASTECTOMY WITH SENTINEL LYMPH NODE BIOPSY (Left)  Patient Location: PACU  Anesthesia Type:General  Level of Consciousness: awake and patient cooperative  Airway & Oxygen Therapy: Patient Spontanous Breathing and Patient connected to nasal cannula oxygen  Post-op Assessment: Report given to PACU RN, Post -op Vital signs reviewed and stable and Patient moving all extremities X 4  Post vital signs: Reviewed and stable  Complications: No apparent anesthesia complications

## 2013-12-21 MED ORDER — DIPHENHYDRAMINE HCL 50 MG/ML IJ SOLN
25.0000 mg | Freq: Four times a day (QID) | INTRAMUSCULAR | Status: DC | PRN
  Administered 2013-12-21: 25 mg via INTRAVENOUS
  Filled 2013-12-21: qty 1

## 2013-12-21 MED ORDER — OXYCODONE-ACETAMINOPHEN 5-325 MG PO TABS: 1.0000 | ORAL_TABLET | ORAL | Status: DC | PRN

## 2013-12-21 NOTE — Progress Notes (Signed)
1 Day Post-Op  Subjective: She is still needing some IV pain meds. Has not tolerated solid food or ambulated yet  Objective: Vital signs in last 24 hours: Temp:  [97.5 F (36.4 C)-99 F (37.2 C)] 98.7 F (37.1 C) (06/19 0606) Pulse Rate:  [71-83] 83 (06/19 0606) Resp:  [14-17] 16 (06/19 0606) BP: (90-137)/(53-90) 110/74 mmHg (06/19 0606) SpO2:  [91 %-95 %] 94 % (06/19 0606) Last BM Date: 12/18/13  Intake/Output from previous day: 06/18 0701 - 06/19 0700 In: 3025.8 [P.O.:1800; I.V.:1225.8] Out: 4385 [Urine:4200; Drains:135; Blood:50] Intake/Output this shift:    Resp: clear to auscultation bilaterally Chest wall: skin flaps look healthy Cardio: regular rate and rhythm GI: soft, non-tender; bowel sounds normal; no masses,  no organomegaly  Lab Results:  No results found for this basename: WBC, HGB, HCT, PLT,  in the last 72 hours BMET No results found for this basename: NA, K, CL, CO2, GLUCOSE, BUN, CREATININE, CALCIUM,  in the last 72 hours PT/INR No results found for this basename: LABPROT, INR,  in the last 72 hours ABG No results found for this basename: PHART, PCO2, PO2, HCO3,  in the last 72 hours  Studies/Results: Nm Sentinel Node Inj-no Rpt (breast)  12/20/2013   CLINICAL DATA: left breast cancer   Sulfur colloid was injected intradermally by the nuclear medicine  technologist for breast cancer sentinel node localization.     Anti-infectives: Anti-infectives   Start     Dose/Rate Route Frequency Ordered Stop   12/20/13 0600  ceFAZolin (ANCEF) IVPB 2 g/50 mL premix     2 g 100 mL/hr over 30 Minutes Intravenous On call to O.R. 12/19/13 1423 12/20/13 0758      Assessment/Plan: s/p Procedure(s): MASTECTOMY WITH SENTINEL LYMPH NODE BIOPSY (Left) Advance diet Ambulate. Work on pain control today Hopefully ready for discharge tomorrow  LOS: 1 day    TOTH III,PAUL S 12/21/2013

## 2013-12-21 NOTE — Progress Notes (Signed)
Patient itching after taking morphine.  Informed MD and benadryl ordered.

## 2013-12-22 MED ORDER — HYDROCODONE-ACETAMINOPHEN 5-325 MG PO TABS: 1.0000 | ORAL_TABLET | Freq: Four times a day (QID) | ORAL | Status: DC | PRN

## 2013-12-22 NOTE — Discharge Instructions (Signed)
See above

## 2013-12-22 NOTE — Progress Notes (Signed)
Discharge home. Home discharge instruction given . No questions verbalized. 

## 2013-12-22 NOTE — Discharge Summary (Signed)
Patient ID: [name] [id number] 68 y.o. 1945-08-19  Admit date: 12/20/2013  Discharge date and time: No discharge date for patient encounter.  Admitting Physician: Autumn Messing, MD  Discharge Physician: Adin Hector  Admission Diagnoses: LEFT BREAST CANCER   Discharge Diagnoses: same  Operations: Procedure(s): MASTECTOMY WITH SENTINEL LYMPH NODE BIOPSY  Admission Condition: good  Discharged Condition: good  Indication for Admission:  The patient is a 68 year old black female who recently went for a routine screening mammogram.  She does report that she will occasionally notice a small bit of clear discharge from her breast but this is unchanged. She has no personal or family history of breast cancer. 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 She was ER PR positive and HER-2/neu negative. Her Ki-67 was 15%. Her MRI estimated the size of the area at 1.3 cm but also showed some enhancement coming off the anterior portion of the ascending towards the nipple that encompassed an area of 8.8 cm. She underwent MRI guided biopsy of the second area in the left breast and that also showed invasive ductal carcinoma. Decision was made to proceed with mastectomy.Marland Kitchen     Hospital Course: On the day of admission the patient was taken the operating room and underwent Left total mastectomy with sentinel lymph node biopsy. Final pathology showed 2 foci of invasive carcinoma, both less than 2 cm. A single sentinel lymph node was negative. On the first postoperative day the patient was stable but did not feel good enough to go home. Had not tolerated diet much and was still requiring IV pain medications. On postoperative day #2 the patient was doing better. Was getting out of bed tolerating diet. Was not strongly motivated but was willing to go home with her granddaughter. At the time of discharge her left  mastectomy skin flaps were viable although little blood ecchymoses were noted. There was no hematoma. The drainage was thin and serosanguineous in both drains were functioning. She was given instruction in diet and activities. She was given a prescription for hydrocodone for pain. She has an appointment to see Dr. Marlou Starks in about 9 or 10 days.  Consults: None  Significant Diagnostic Studies: Surgical pathology  Treatments: surgery: Left total mastectomy and left axillary sentinel the biopsy  Disposition: Home  Patient Instructions:    Medication List         albuterol 108 (90 BASE) MCG/ACT inhaler  Commonly known as:  PROVENTIL HFA;VENTOLIN HFA  Inhale 1-2 puffs into the lungs every 4 (four) hours as needed for wheezing or shortness of breath.     ALPRAZolam 1 MG tablet  Commonly known as:  XANAX  Take 1 tablet (1 mg total) by mouth 3 (three) times daily as needed for sleep or anxiety.     amLODipine 10 MG tablet  Commonly known as:  NORVASC  Take 1 tablet (10 mg total) by mouth daily.     atenolol 50 MG tablet  Commonly known as:  TENORMIN  Take 2 tablets (100 mg total) by mouth daily.     hydrochlorothiazide 12.5 MG capsule  Commonly known as:  MICROZIDE  Take 1 capsule (12.5 mg total) by mouth daily.     metFORMIN 500 MG 24 hr tablet  Commonly known as:  GLUCOPHAGE XR  Take 1 tablet (500 mg total) by mouth daily.     oxyCODONE-acetaminophen 10-325 MG per tablet  Commonly known as:  PERCOCET  Take 1 tablet by mouth every 4 (four) hours as needed for pain.     oxyCODONE-acetaminophen 5-325 MG per tablet  Commonly known as:  ROXICET  Take 1-2 tablets by mouth every 4 (four) hours as needed.     sulfacetamide-prednisoLONE 10-0.23 % ophthalmic solution  Commonly known as:  VASOCIDIN  Place 1 drop into both eyes daily as needed (allergies).     VITAMIN D PO  Take 1 capsule by mouth daily.        Activity: activity as tolerated Diet: low fat, low cholesterol  diet Wound Care: as directed  Follow-up:  With Dr. Toth in 9 days.  Signed: Haywood M. Ingram, M.D., FACS General and minimally invasive surgery Breast and Colorectal Surgery  12/22/2013, 9:04 AM   

## 2013-12-22 NOTE — Progress Notes (Signed)
JP teaching done, Patient was able to do own JP drain.No questions verbalized.

## 2013-12-24 ENCOUNTER — Encounter (HOSPITAL_COMMUNITY): Payer: Self-pay | Admitting: General Surgery

## 2013-12-24 LAB — GLUCOSE, CAPILLARY: Glucose-Capillary: 212 mg/dL — ABNORMAL HIGH (ref 70–99)

## 2013-12-25 ENCOUNTER — Encounter: Payer: Self-pay | Admitting: *Deleted

## 2013-12-25 NOTE — Progress Notes (Signed)
Received order for Oncotype Dx. Requisition sent to pathology. Received by Alyse Low.

## 2013-12-26 ENCOUNTER — Ambulatory Visit: Payer: Medicare Other | Admitting: Internal Medicine

## 2013-12-28 ENCOUNTER — Ambulatory Visit (INDEPENDENT_AMBULATORY_CARE_PROVIDER_SITE_OTHER): Payer: Medicare Other | Admitting: General Surgery

## 2013-12-28 ENCOUNTER — Other Ambulatory Visit: Payer: Self-pay | Admitting: Internal Medicine

## 2013-12-28 ENCOUNTER — Encounter (INDEPENDENT_AMBULATORY_CARE_PROVIDER_SITE_OTHER): Payer: Self-pay | Admitting: General Surgery

## 2013-12-28 VITALS — BP 118/70 | HR 84 | Resp 16 | Ht 63.0 in | Wt 201.4 lb

## 2013-12-28 DIAGNOSIS — C50219 Malignant neoplasm of upper-inner quadrant of unspecified female breast: Secondary | ICD-10-CM

## 2013-12-28 DIAGNOSIS — C50212 Malignant neoplasm of upper-inner quadrant of left female breast: Secondary | ICD-10-CM

## 2013-12-28 NOTE — Patient Instructions (Signed)
Sponge bathe until drain is removed Continue to record output from remaining drain

## 2014-01-01 ENCOUNTER — Encounter (INDEPENDENT_AMBULATORY_CARE_PROVIDER_SITE_OTHER): Payer: Self-pay | Admitting: General Surgery

## 2014-01-01 ENCOUNTER — Ambulatory Visit (INDEPENDENT_AMBULATORY_CARE_PROVIDER_SITE_OTHER): Payer: Medicare Other | Admitting: General Surgery

## 2014-01-01 VITALS — BP 126/72 | HR 72 | Temp 97.0°F | Ht 63.0 in | Wt 198.0 lb

## 2014-01-01 DIAGNOSIS — C50212 Malignant neoplasm of upper-inner quadrant of left female breast: Secondary | ICD-10-CM

## 2014-01-01 DIAGNOSIS — C50219 Malignant neoplasm of upper-inner quadrant of unspecified female breast: Secondary | ICD-10-CM

## 2014-01-01 NOTE — Patient Instructions (Signed)
Sponge bathe until drain is removed Continue to record output and recharge drain twice a day

## 2014-01-02 ENCOUNTER — Encounter (HOSPITAL_COMMUNITY): Payer: Self-pay

## 2014-01-02 NOTE — Progress Notes (Signed)
Subjective:     Patient ID: [name], female   DOB: July 10, 1945, 68 y.o.   MRN: [id number]  HPI The patient is a 68 year old black female who is one-week status post left mastectomy and negative sentinel node biopsy for a T1 C. N0 left breast cancer. She was ER and PR positive and HER-2/neu negative with a Ki-67 of 9%. She has no complaints today. She tolerated the surgery well. Her medial drain is putting out less than 30 cc a day. Her lateral drain is putting out more than 30 cc a day.  Review of Systems     Objective:   Physical Exam On exam her left mastectomy incision is healing nicely. She does have a small area in the medial superior skin flap of some ischemic change to the skin but I do think This will live. The medial drain was removed without difficulty. She tolerated this well. The lateral drain will remain in place     Assessment:     The patient is one-week status post left mastectomy for breast cancer     Plan:     At this point she will continue to record the output from the remaining drain. I will plan to see her back in one week to check her progress

## 2014-01-02 NOTE — Progress Notes (Signed)
Subjective:     Patient ID: [name], female   DOB: 1946-04-21, 68 y.o.   MRN: [id number]  HPI The patient is a 68 year old black female who is 2 weeks status post left mastectomy and negative sentinel node biopsy for a T1 C. N0 left breast cancer. She was ER and PR positive and HER-2/neu negative. Her Ki-67 was 9%. Since her last visit she is done well. She still is having about 60 cc of fluid a day out of her drain. She denies any pain.  Review of Systems     Objective:   Physical Exam On exam her left mastectomy incision is healing reasonably well. She has a small area on the medial superior skin flap where there is some darkening of the skin but I think this will live.    Assessment:     The patient is 2 weeks status post left mastectomy for breast cancer     Plan:     At this point I will plan to leave the remaining drain and one more week. I will see her back in one week to check her progress.

## 2014-01-08 ENCOUNTER — Telehealth (INDEPENDENT_AMBULATORY_CARE_PROVIDER_SITE_OTHER): Payer: Self-pay | Admitting: General Surgery

## 2014-01-08 ENCOUNTER — Ambulatory Visit (INDEPENDENT_AMBULATORY_CARE_PROVIDER_SITE_OTHER): Payer: Medicare Other

## 2014-01-08 VITALS — BP 128/82 | HR 80 | Temp 97.6°F | Resp 18

## 2014-01-08 DIAGNOSIS — Z4889 Encounter for other specified surgical aftercare: Secondary | ICD-10-CM

## 2014-01-08 DIAGNOSIS — Z4803 Encounter for change or removal of drains: Secondary | ICD-10-CM

## 2014-01-08 NOTE — Telephone Encounter (Signed)
Spoke with pt and informed her we are moving her appt time on 7/13 from 940 to 350.

## 2014-01-08 NOTE — Progress Notes (Signed)
Patient comes into office today for drain removal.  Patient status post Mastectomy w/Sentinel Lymph Node Biopsy on 12/20/13.  Patients recorded drain output has been less than 5 mL's over the last 72 hours.  Order noted in patients  appointment from Dr. Marlou Starks for Drain Removal.  Patients incision appears to be healing well.  Removed suture and proceeded with drain removal.  Placed dry gauze over drain site.  Patient tolerated well.  Patient has follow up appointment on 01/14/14 @ 9:40 am w/Dr. Marlou Starks.  Patient advised to call our office if she has any questions or concerns.  Patient verbalized understanding.

## 2014-01-09 NOTE — Progress Notes (Signed)
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 - LOBULAR NEOPLASIA  Receptor Status: ER(+), PR (+), Her2-neu (-)  Did patient present with symptoms (if so, please note symptoms) or was this found on screening mammography?:  Past/Anticipated interventions by surgeon, if VIC:FPFBSJXC: Source of Specimen: left mastectomy and sentinel node 12/20/2013.    Past/Anticipated interventions by medical oncology, if any: Chemotherapy, not seen by medical oncologist.  Lymphedema issues, if any: No  Pain issues, if VGO:KGKREQJC site painful to touch.  SAFETY ISSUES:  Prior radiation?No  Pacemaker/ICD?No  Possible current pregnancy?No, hysterectomy in 1981.Postmenopausal.  Is the patient on methotrexate?No  Current Complaints / other details:Divorced. Menses age 68.Last menstrual period age 89.GX P1.First live birth age 85. GX P1.Took birth control pills for 10 years.No hormonal replacement therapy. Quit smoking on 11/11/2000    Arlyss Repress, RN 01/09/2014,10:14 AM

## 2014-01-10 ENCOUNTER — Ambulatory Visit
Admission: RE | Admit: 2014-01-10 | Discharge: 2014-01-10 | Disposition: A | Discharge status: Home / Self Care | Payer: Medicare Other | Source: Ambulatory Visit | Attending: Radiation Oncology | Admitting: Radiation Oncology

## 2014-01-10 VITALS — BP 153/85 | HR 72 | Temp 98.1°F | Wt 196.1 lb

## 2014-01-10 DIAGNOSIS — Z9010 Acquired absence of unspecified breast and nipple: Secondary | ICD-10-CM | POA: Insufficient documentation

## 2014-01-10 DIAGNOSIS — C50212 Malignant neoplasm of upper-inner quadrant of left female breast: Secondary | ICD-10-CM

## 2014-01-10 DIAGNOSIS — D0590 Unspecified type of carcinoma in situ of unspecified breast: Secondary | ICD-10-CM | POA: Diagnosis not present

## 2014-01-10 DIAGNOSIS — Z510 Encounter for antineoplastic radiation therapy: Secondary | ICD-10-CM | POA: Diagnosis not present

## 2014-01-10 DIAGNOSIS — Z170 Estrogen receptor positive status [ER+]: Secondary | ICD-10-CM | POA: Insufficient documentation

## 2014-01-10 NOTE — Addendum Note (Signed)
Encounter addended by: Arlyss Repress, RN on: 01/10/2014  4:53 PM<BR>     Documentation filed: Charges VN

## 2014-01-10 NOTE — Progress Notes (Addendum)
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  She underwent a left mastectomy on 12/20/2013. This showed multifocal disease with the largest area measuring 1.2 cm. Ductal carcinoma in situ and lobular carcinoma in situ was noted between the 2 lesions. A single left axillary lymph node was negative for metastatic disease. Margins were negative. She was ER positive at 100% PR +89% and HER-2 was negative. An Oncotype has been sent with a recurrence score of 6. She presents with her sister today. She is feeling well from her surgery. She has some soreness on the left chest wall. She has an appointment with Dr. Marlou Starks on Monday. Drains have been pulled.  Allergies:  Allergies  Allergen Reactions  . Ace Inhibitors Other (See Comments)    Acute renal failure  . Angiotensin Receptor Blockers Other (See Comments)    Acute Renal Failure    Medications:  Current Outpatient Prescriptions  Medication Sig Dispense Refill  . albuterol (PROVENTIL HFA;VENTOLIN HFA) 108 (90 BASE) MCG/ACT inhaler Inhale 1-2 puffs into the lungs every 4 (four) hours as needed for wheezing or shortness of breath.      . ALPRAZolam (XANAX) 1 MG tablet Take 1 tablet (1 mg total) by mouth 3 (three) times daily as needed for sleep or anxiety.  90 tablet  1  . amLODipine (NORVASC) 10 MG tablet Take 1 tablet (10 mg total) by mouth daily.  90 tablet  1  . atenolol (TENORMIN) 50 MG tablet Take 2 tablets (100 mg total) by mouth daily.  180 tablet  1  . Cholecalciferol (VITAMIN D PO) Take 1 capsule by mouth daily.      . hydrochlorothiazide (MICROZIDE) 12.5 MG capsule Take 1 capsule (12.5 mg total) by mouth daily.  90 capsule  1  . metFORMIN  (GLUCOPHAGE XR) 500 MG 24 hr tablet Take 1 tablet (500 mg total) by mouth daily.  90 tablet  1  . oxyCODONE-acetaminophen (PERCOCET) 10-325 MG per tablet Take 1 tablet by mouth every 4 (four) hours as needed for pain.      Marland Kitchen oxyCODONE-acetaminophen (ROXICET) 5-325 MG per tablet Take 1-2 tablets by mouth every 4 (four) hours as needed.  50 tablet  0  . PROAIR HFA 108 (90 BASE) MCG/ACT inhaler INHALE 1 TO 2 PUFFS EVERY 4 TO 6 HOURS AS NEEDED.  8.5 each  1  . sulfacetamide-prednisoLONE (VASOCIDIN) 10-0.23 % ophthalmic solution Place 1 drop into both eyes daily as needed (allergies).       Marland Kitchen HYDROcodone-acetaminophen (NORCO) 5-325 MG per tablet Take 1-2 tablets by mouth every 6 (six) hours as needed.  30 tablet  0   No current facility-administered medications for this encounter.    Physical Exam:  Filed Vitals:   01/10/14 0848  BP: 153/85  Pulse: 72  Temp: 98.1 F (36.7 C)  Weight: 196 lb 1.6 oz (88.95 kg)  SpO2: 96%   she has a scar tissue forming well over the majority of her scar. She does have some dehiscence happening in the medial aspect of the scar with some nonpurulent drainage. Her drain sites are still slightly open.  IMPRESSION: [name] is a 68 y.o. female status post mastectomy with no risk factors for  postmastectomy radiation  PLAN:  I spoke with the patient today. We discussed her Oncotype score and that she would not require chemotherapy. We discussed that this was predicated on her taking antiestrogen treatment. I will refer her to medical oncology for discussion. She does not have risk factors for radiation so she will not require postmastectomy radiation. We discussed placing Steri-Strips and some Neosporin on these open areas of her chest wall. She will see Dr. Marlou Starks on Monday. I will followup with her on a when necessary basis.    Thea Silversmith, MD

## 2014-01-10 NOTE — Progress Notes (Signed)
Please see the Nurse Progress Note in the MD Initial Consult Encounter for this patient. 

## 2014-01-14 ENCOUNTER — Encounter (INDEPENDENT_AMBULATORY_CARE_PROVIDER_SITE_OTHER): Payer: Self-pay | Admitting: General Surgery

## 2014-01-14 ENCOUNTER — Ambulatory Visit (INDEPENDENT_AMBULATORY_CARE_PROVIDER_SITE_OTHER): Payer: Medicare Other | Admitting: General Surgery

## 2014-01-14 VITALS — BP 124/80 | HR 80 | Temp 97.8°F | Ht 63.0 in | Wt 193.0 lb

## 2014-01-14 DIAGNOSIS — C50219 Malignant neoplasm of upper-inner quadrant of unspecified female breast: Secondary | ICD-10-CM

## 2014-01-14 DIAGNOSIS — C50212 Malignant neoplasm of upper-inner quadrant of left female breast: Secondary | ICD-10-CM

## 2014-01-14 NOTE — Progress Notes (Signed)
Subjective:     Patient ID: [name], female   DOB: May 09, 1946, 68 y.o.   MRN: [id number]  HPI The patient is a 68 year old black female who is one month status post left mastectomy and negative sentinel node biopsy for a T1 C. N0 left breast cancer. She was ER PR positive and HER-2/neu negative with a Ki-67 of 9%. She has had a small area of ischemic skin on the superior medial skin flap. Since her last visit the skin has broken down and the mastectomy cavity has opened up slightly. She has had some clear drainage from the area.  Review of Systems     Objective:   Physical Exam On exam the lateral portion of the left mastectomy incision is healing well. The medial portion has had a small area of skin slough. This area was sharply debrided with a 15 blade knife and the wound was packed with 4 x 4 gauze. Dressings were applied.    Assessment:     The patient is one month status post left mastectomy complicated by a small area of skin slough medially     Plan:     At this point I will have her return tomorrow for another dressing change. If this drainage is hard to control then I may have to take her back to the operating room to debride the area and vac the wound.

## 2014-01-14 NOTE — Patient Instructions (Signed)
Return tomorrow for dressing change.

## 2014-01-15 ENCOUNTER — Encounter (INDEPENDENT_AMBULATORY_CARE_PROVIDER_SITE_OTHER): Payer: Self-pay | Admitting: General Surgery

## 2014-01-15 ENCOUNTER — Telehealth: Payer: Self-pay | Admitting: *Deleted

## 2014-01-15 ENCOUNTER — Ambulatory Visit (INDEPENDENT_AMBULATORY_CARE_PROVIDER_SITE_OTHER): Payer: Medicare Other | Admitting: General Surgery

## 2014-01-15 VITALS — BP 126/80 | HR 70 | Resp 18 | Ht 63.0 in | Wt 194.0 lb

## 2014-01-15 DIAGNOSIS — C50219 Malignant neoplasm of upper-inner quadrant of unspecified female breast: Secondary | ICD-10-CM

## 2014-01-15 DIAGNOSIS — C50212 Malignant neoplasm of upper-inner quadrant of left female breast: Secondary | ICD-10-CM

## 2014-01-15 NOTE — Addendum Note (Signed)
Encounter addended by: Thea Silversmith, MD on: 01/15/2014 11:59 AM<BR>     Documentation filed: Notes Section

## 2014-01-15 NOTE — Progress Notes (Signed)
Subjective:     Patient ID: [name], female   DOB: 12/02/1945, 68 y.o.   MRN: [id number]  HPI The patient is a 26 her black female who is one month status post left mastectomy and negative sentinel node biopsy for a T1 C. N0 left breast cancer. She was ER and PR positive and HER-2/neu negative with a Ki-67 9%. Her postoperative course has been complicated by a small area of skin flap necrosis on the superior-medial flap. She returns today for a dressing change. She continues to have some clear yellowish drainage from the wound.  Review of Systems  Constitutional: Negative.   HENT: Negative.   Eyes: Negative.   Respiratory: Negative.   Cardiovascular: Negative.   Gastrointestinal: Negative.   Endocrine: Negative.   Genitourinary: Negative.   Musculoskeletal: Negative.   Skin: Negative.   Allergic/Immunologic: Negative.   Neurological: Negative.   Hematological: Negative.   Psychiatric/Behavioral: Negative.        Objective:   Physical Exam  Constitutional: She is oriented to person, place, and time. She appears well-developed and well-nourished.  HENT:  Head: Normocephalic and atraumatic.  Eyes: Conjunctivae and EOM are normal. Pupils are equal, round, and reactive to light.  Neck: Normal range of motion. Neck supple.  Cardiovascular: Normal rate, regular rhythm and normal heart sounds.   Pulmonary/Chest: Effort normal and breath sounds normal.  There is a small area of skin flap necrosis on the superior-medial flap. The mastectomy cavity is exposed.  Abdominal: Soft. Bowel sounds are normal.  Musculoskeletal: Normal range of motion.  Lymphadenopathy:    She has no cervical adenopathy.  Neurological: She is alert and oriented to person, place, and time.  Skin: Skin is warm and dry.  Psychiatric: She has a normal mood and affect. Her behavior is normal.       Assessment:     The patient is one month status post left mastectomy for breast cancer complicated by a small  area of skin flap necrosis     Plan:     At this point I think it would be best to debridement the area of skin flap necrosis and then apply a vacuum dressing to help with healing. I've discussed with her in detail the risks and benefits the operation to do this as well as some of the technical aspects and she understands and wishes to proceed

## 2014-01-15 NOTE — Patient Instructions (Signed)
Plan for debridement on Thursday

## 2014-01-15 NOTE — Telephone Encounter (Signed)
Called patient to inform of appt. With Dr. Alen Blew on 01-30-14- arrival time - 10:30 am, spoke with patient and she is aware of this appt.

## 2014-01-16 ENCOUNTER — Encounter (HOSPITAL_COMMUNITY): Payer: Self-pay | Admitting: *Deleted

## 2014-01-16 ENCOUNTER — Encounter (INDEPENDENT_AMBULATORY_CARE_PROVIDER_SITE_OTHER): Payer: Self-pay | Admitting: General Surgery

## 2014-01-16 ENCOUNTER — Ambulatory Visit (INDEPENDENT_AMBULATORY_CARE_PROVIDER_SITE_OTHER): Payer: Medicare Other | Admitting: General Surgery

## 2014-01-16 ENCOUNTER — Other Ambulatory Visit (INDEPENDENT_AMBULATORY_CARE_PROVIDER_SITE_OTHER): Payer: Self-pay | Admitting: General Surgery

## 2014-01-16 ENCOUNTER — Encounter (HOSPITAL_COMMUNITY): Payer: Self-pay | Admitting: Pharmacy Technician

## 2014-01-16 VITALS — BP 124/82 | HR 71 | Temp 97.1°F | Resp 16 | Wt 191.8 lb

## 2014-01-16 DIAGNOSIS — C50212 Malignant neoplasm of upper-inner quadrant of left female breast: Secondary | ICD-10-CM

## 2014-01-16 DIAGNOSIS — C50219 Malignant neoplasm of upper-inner quadrant of unspecified female breast: Secondary | ICD-10-CM

## 2014-01-16 NOTE — Progress Notes (Signed)
Subjective:     Patient ID: [name], female   DOB: 03-13-1946, 68 y.o.   MRN: [id number]  HPI The patient is a 68 year old black female who is about one month status post left mastectomy and negative sentinel node biopsy for a T1 C. N0 breast cancer. Her course has been complicated by a small area of skin flap necrosis on the superior and medial flap. She has had a large amount of serous drainage from the area. She denies any fevers or chills.  Review of Systems     Objective:   Physical Exam On exam her left mastectomy incision laterally is healing well. Medially she has a small area of skin flap necrosis on the superior medial skin flap. The dressing was changed today. She tolerated this well.    Assessment:     The patient is one month status post left mastectomy complicated by a small area of skin flap necrosis     Plan:     At this point I will plan to take her back to the operating room tomorrow to debris the chest wall and apply a vacuum dressing.

## 2014-01-16 NOTE — Patient Instructions (Addendum)
Plan for surgery tomorrow at [facility]

## 2014-01-16 NOTE — Progress Notes (Signed)
No pre-op orders in EPIC, called Dr. Ethlyn Gallery office and spoke with Colletta [place]. She will contact Dr. Marlou Starks and have him put in orders.

## 2014-01-17 ENCOUNTER — Encounter (HOSPITAL_COMMUNITY): Payer: Self-pay | Admitting: Certified Registered Nurse Anesthetist

## 2014-01-17 ENCOUNTER — Ambulatory Visit (HOSPITAL_COMMUNITY): Payer: Medicare Other | Admitting: Certified Registered Nurse Anesthetist

## 2014-01-17 ENCOUNTER — Ambulatory Visit (HOSPITAL_COMMUNITY)
Admission: RE | Admit: 2014-01-17 | Discharge: 2014-01-18 | Disposition: A | Discharge status: Home / Self Care | Payer: Medicare Other | Source: Ambulatory Visit | Attending: General Surgery | Admitting: General Surgery

## 2014-01-17 ENCOUNTER — Encounter (HOSPITAL_COMMUNITY)
Admission: RE | Disposition: A | Discharge status: Home / Self Care | Payer: Self-pay | Source: Ambulatory Visit | Attending: General Surgery

## 2014-01-17 ENCOUNTER — Encounter (HOSPITAL_COMMUNITY): Payer: Medicare Other | Admitting: Certified Registered Nurse Anesthetist

## 2014-01-17 DIAGNOSIS — Z853 Personal history of malignant neoplasm of breast: Secondary | ICD-10-CM | POA: Insufficient documentation

## 2014-01-17 DIAGNOSIS — Z9010 Acquired absence of unspecified breast and nipple: Secondary | ICD-10-CM | POA: Insufficient documentation

## 2014-01-17 DIAGNOSIS — F411 Generalized anxiety disorder: Secondary | ICD-10-CM | POA: Insufficient documentation

## 2014-01-17 DIAGNOSIS — I739 Peripheral vascular disease, unspecified: Secondary | ICD-10-CM | POA: Insufficient documentation

## 2014-01-17 DIAGNOSIS — T86828 Other complications of skin graft (allograft) (autograft): Secondary | ICD-10-CM

## 2014-01-17 DIAGNOSIS — L988 Other specified disorders of the skin and subcutaneous tissue: Secondary | ICD-10-CM | POA: Diagnosis not present

## 2014-01-17 DIAGNOSIS — F329 Major depressive disorder, single episode, unspecified: Secondary | ICD-10-CM | POA: Insufficient documentation

## 2014-01-17 DIAGNOSIS — E119 Type 2 diabetes mellitus without complications: Secondary | ICD-10-CM | POA: Diagnosis not present

## 2014-01-17 DIAGNOSIS — F3289 Other specified depressive episodes: Secondary | ICD-10-CM | POA: Insufficient documentation

## 2014-01-17 DIAGNOSIS — T888XXD Other specified complications of surgical and medical care, not elsewhere classified, subsequent encounter: Secondary | ICD-10-CM

## 2014-01-17 DIAGNOSIS — I10 Essential (primary) hypertension: Secondary | ICD-10-CM | POA: Insufficient documentation

## 2014-01-17 DIAGNOSIS — Z87891 Personal history of nicotine dependence: Secondary | ICD-10-CM | POA: Insufficient documentation

## 2014-01-17 DIAGNOSIS — T8189XA Other complications of procedures, not elsewhere classified, initial encounter: Secondary | ICD-10-CM

## 2014-01-17 DIAGNOSIS — I96 Gangrene, not elsewhere classified: Secondary | ICD-10-CM

## 2014-01-17 HISTORY — PX: WOUND DEBRIDEMENT: SHX247

## 2014-01-17 HISTORY — DX: Migraine, unspecified, not intractable, without status migrainosus: G43.909

## 2014-01-17 HISTORY — DX: Type 2 diabetes mellitus without complications: E11.9

## 2014-01-17 HISTORY — DX: Malignant neoplasm of unspecified site of unspecified female breast: C50.919

## 2014-01-17 HISTORY — PX: APPLICATION OF WOUND VAC: SHX5189

## 2014-01-17 LAB — CBC
HCT: 42.4 % (ref 36.0–46.0)
Hemoglobin: 14 g/dL (ref 12.0–15.0)
MCH: 30.2 pg (ref 26.0–34.0)
MCHC: 33 g/dL (ref 30.0–36.0)
MCV: 91.6 fL (ref 78.0–100.0)
Platelets: 353 10*3/uL (ref 150–400)
RBC: 4.63 MIL/uL (ref 3.87–5.11)
RDW: 14.6 % (ref 11.5–15.5)
WBC: 6.7 10*3/uL (ref 4.0–10.5)

## 2014-01-17 LAB — BASIC METABOLIC PANEL
Anion gap: 17 — ABNORMAL HIGH (ref 5–15)
BUN: 11 mg/dL (ref 6–23)
CALCIUM: 10.1 mg/dL (ref 8.4–10.5)
CHLORIDE: 91 meq/L — AB (ref 96–112)
CO2: 30 mEq/L (ref 19–32)
CREATININE: 0.91 mg/dL (ref 0.50–1.10)
GFR calc non Af Amer: 63 mL/min — ABNORMAL LOW (ref >90)
GFR, EST AFRICAN AMERICAN: 73 mL/min — AB (ref >90)
Glucose, Bld: 222 mg/dL — ABNORMAL HIGH (ref 70–99)
Potassium: 3.6 mEq/L — ABNORMAL LOW (ref 3.7–5.3)
Sodium: 138 mEq/L (ref 137–147)

## 2014-01-17 LAB — GLUCOSE, CAPILLARY
GLUCOSE-CAPILLARY: 209 mg/dL — AB (ref 70–99)
GLUCOSE-CAPILLARY: 281 mg/dL — AB (ref 70–99)
Glucose-Capillary: 194 mg/dL — ABNORMAL HIGH (ref 70–99)
Glucose-Capillary: 186 mg/dL — ABNORMAL HIGH (ref 70–99)
Glucose-Capillary: 316 mg/dL — ABNORMAL HIGH (ref 70–99)

## 2014-01-17 SURGERY — APPLICATION, WOUND VAC
Anesthesia: General | Site: Chest | Laterality: Left

## 2014-01-17 MED ORDER — INSULIN ASPART 100 UNIT/ML ~~LOC~~ SOLN
0.0000 [IU] | SUBCUTANEOUS | Status: DC
  Administered 2014-01-17: 7 [IU] via SUBCUTANEOUS
  Administered 2014-01-17: 5 [IU] via SUBCUTANEOUS
  Administered 2014-01-18: 2 [IU] via SUBCUTANEOUS
  Administered 2014-01-18: 3 [IU] via SUBCUTANEOUS
  Administered 2014-01-18 (×2): 2 [IU] via SUBCUTANEOUS

## 2014-01-17 MED ORDER — KCL IN DEXTROSE-NACL 20-5-0.45 MEQ/L-%-% IV SOLN
INTRAVENOUS | Status: AC
  Filled 2014-01-17: qty 1000

## 2014-01-17 MED ORDER — DEXAMETHASONE SODIUM PHOSPHATE 4 MG/ML IJ SOLN
INTRAMUSCULAR | Status: AC
  Filled 2014-01-17: qty 1

## 2014-01-17 MED ORDER — ALPRAZOLAM 0.5 MG PO TABS: 1.0000 mg | ORAL_TABLET | Freq: Three times a day (TID) | ORAL | Status: DC | PRN

## 2014-01-17 MED ORDER — CHLORHEXIDINE GLUCONATE 4 % EX LIQD
1.0000 "application " | Freq: Once | CUTANEOUS | Status: DC
  Filled 2014-01-17: qty 15

## 2014-01-17 MED ORDER — ONDANSETRON HCL 4 MG/2ML IJ SOLN
INTRAMUSCULAR | Status: DC | PRN
  Administered 2014-01-17: 4 mg via INTRAVENOUS

## 2014-01-17 MED ORDER — SODIUM CHLORIDE 0.9 % IJ SOLN
INTRAMUSCULAR | Status: AC
  Filled 2014-01-17: qty 10

## 2014-01-17 MED ORDER — AMLODIPINE BESYLATE 10 MG PO TABS
10.0000 mg | ORAL_TABLET | Freq: Every day | ORAL | Status: DC
  Filled 2014-01-17: qty 1

## 2014-01-17 MED ORDER — PROPOFOL 10 MG/ML IV BOLUS
INTRAVENOUS | Status: DC | PRN
  Administered 2014-01-17: 130 mg via INTRAVENOUS

## 2014-01-17 MED ORDER — DEXAMETHASONE SODIUM PHOSPHATE 4 MG/ML IJ SOLN
INTRAMUSCULAR | Status: DC | PRN
  Administered 2014-01-17: 4 mg via INTRAVENOUS

## 2014-01-17 MED ORDER — CEFAZOLIN SODIUM-DEXTROSE 2-3 GM-% IV SOLR
2.0000 g | Freq: Once | INTRAVENOUS | Status: AC
  Administered 2014-01-17: 2 g via INTRAVENOUS

## 2014-01-17 MED ORDER — ONDANSETRON HCL 4 MG PO TABS: 4.0000 mg | ORAL_TABLET | Freq: Four times a day (QID) | ORAL | Status: DC | PRN

## 2014-01-17 MED ORDER — CEFAZOLIN SODIUM-DEXTROSE 2-3 GM-% IV SOLR
INTRAVENOUS | Status: AC
  Filled 2014-01-17: qty 50

## 2014-01-17 MED ORDER — OXYCODONE HCL 5 MG PO TABS: 5.0000 mg | ORAL_TABLET | Freq: Once | ORAL | Status: DC | PRN

## 2014-01-17 MED ORDER — METFORMIN HCL ER 500 MG PO TB24
500.0000 mg | ORAL_TABLET | Freq: Every day | ORAL | Status: DC
  Administered 2014-01-17: 500 mg via ORAL
  Filled 2014-01-17 (×2): qty 1

## 2014-01-17 MED ORDER — ONDANSETRON HCL 4 MG/2ML IJ SOLN: 4.0000 mg | Freq: Four times a day (QID) | INTRAMUSCULAR | Status: DC | PRN

## 2014-01-17 MED ORDER — METOCLOPRAMIDE HCL 5 MG/ML IJ SOLN
INTRAMUSCULAR | Status: AC
  Filled 2014-01-17: qty 2

## 2014-01-17 MED ORDER — SULFACETAMIDE-PREDNISOLONE 10-0.23 % OP SOLN
1.0000 [drp] | Freq: Every day | OPHTHALMIC | Status: DC | PRN
  Filled 2014-01-17: qty 5

## 2014-01-17 MED ORDER — PROPOFOL 10 MG/ML IV BOLUS
INTRAVENOUS | Status: AC
Start: 2014-01-17 — End: 2014-01-17
  Filled 2014-01-17: qty 20

## 2014-01-17 MED ORDER — EPHEDRINE SULFATE 50 MG/ML IJ SOLN
INTRAMUSCULAR | Status: DC | PRN
  Administered 2014-01-17: 10 mg via INTRAVENOUS

## 2014-01-17 MED ORDER — ONDANSETRON HCL 4 MG/2ML IJ SOLN: 4.0000 mg | Freq: Once | INTRAMUSCULAR | Status: DC | PRN

## 2014-01-17 MED ORDER — LIDOCAINE HCL (CARDIAC) 10 MG/ML IV SOLN
INTRAVENOUS | Status: DC | PRN
  Administered 2014-01-17: 100 mg via INTRAVENOUS

## 2014-01-17 MED ORDER — MIDAZOLAM HCL 5 MG/5ML IJ SOLN
INTRAMUSCULAR | Status: DC | PRN
  Administered 2014-01-17: 2 mg via INTRAVENOUS

## 2014-01-17 MED ORDER — MORPHINE SULFATE 2 MG/ML IJ SOLN: 2.0000 mg | INTRAMUSCULAR | Status: DC | PRN

## 2014-01-17 MED ORDER — OXYCODONE-ACETAMINOPHEN 5-325 MG PO TABS
1.0000 | ORAL_TABLET | ORAL | Status: DC | PRN
  Administered 2014-01-18 (×2): 2 via ORAL
  Filled 2014-01-17 (×2): qty 2

## 2014-01-17 MED ORDER — LACTATED RINGERS IV SOLN
INTRAVENOUS | Status: DC | PRN
  Administered 2014-01-17: 11:00:00 via INTRAVENOUS

## 2014-01-17 MED ORDER — HEPARIN SODIUM (PORCINE) 5000 UNIT/ML IJ SOLN
5000.0000 [IU] | Freq: Three times a day (TID) | INTRAMUSCULAR | Status: DC
  Administered 2014-01-18 (×2): 5000 [IU] via SUBCUTANEOUS
  Filled 2014-01-17 (×4): qty 1

## 2014-01-17 MED ORDER — FENTANYL CITRATE 0.05 MG/ML IJ SOLN
INTRAMUSCULAR | Status: AC
  Filled 2014-01-17: qty 5

## 2014-01-17 MED ORDER — KCL IN DEXTROSE-NACL 20-5-0.9 MEQ/L-%-% IV SOLN
INTRAVENOUS | Status: DC
  Administered 2014-01-17: 15:00:00 via INTRAVENOUS
  Filled 2014-01-17 (×3): qty 1000

## 2014-01-17 MED ORDER — ONDANSETRON HCL 4 MG/2ML IJ SOLN
INTRAMUSCULAR | Status: AC
  Filled 2014-01-17: qty 2

## 2014-01-17 MED ORDER — FENTANYL CITRATE 0.05 MG/ML IJ SOLN
INTRAMUSCULAR | Status: DC | PRN
  Administered 2014-01-17: 100 ug via INTRAVENOUS

## 2014-01-17 MED ORDER — HYDROCHLOROTHIAZIDE 12.5 MG PO CAPS
12.5000 mg | ORAL_CAPSULE | Freq: Every day | ORAL | Status: DC
  Administered 2014-01-17: 12.5 mg via ORAL
  Filled 2014-01-17 (×2): qty 1

## 2014-01-17 MED ORDER — MIDAZOLAM HCL 2 MG/2ML IJ SOLN
INTRAMUSCULAR | Status: AC
  Filled 2014-01-17: qty 2

## 2014-01-17 MED ORDER — METOCLOPRAMIDE HCL 5 MG/ML IJ SOLN
INTRAMUSCULAR | Status: DC | PRN
  Administered 2014-01-17: 10 mg via INTRAVENOUS

## 2014-01-17 MED ORDER — OXYCODONE HCL 5 MG/5ML PO SOLN: 5.0000 mg | Freq: Once | ORAL | Status: DC | PRN

## 2014-01-17 MED ORDER — HYDROMORPHONE HCL PF 1 MG/ML IJ SOLN: 0.2500 mg | INTRAMUSCULAR | Status: DC | PRN

## 2014-01-17 MED ORDER — EPHEDRINE SULFATE 50 MG/ML IJ SOLN
INTRAMUSCULAR | Status: AC
  Filled 2014-01-17: qty 1

## 2014-01-17 MED ORDER — LACTATED RINGERS IV SOLN
INTRAVENOUS | Status: DC
Start: 2014-01-17 — End: 2014-01-17
  Administered 2014-01-17: 10:00:00 via INTRAVENOUS

## 2014-01-17 MED ORDER — PROMETHAZINE HCL 25 MG/ML IJ SOLN: 6.2500 mg | INTRAMUSCULAR | Status: DC | PRN

## 2014-01-17 MED ORDER — CEFAZOLIN SODIUM-DEXTROSE 2-3 GM-% IV SOLR: 2.0000 g | INTRAVENOUS | Status: DC

## 2014-01-17 MED ORDER — ALBUTEROL SULFATE (2.5 MG/3ML) 0.083% IN NEBU: 3.0000 mL | INHALATION_SOLUTION | RESPIRATORY_TRACT | Status: DC | PRN

## 2014-01-17 MED ORDER — ATENOLOL 100 MG PO TABS
100.0000 mg | ORAL_TABLET | Freq: Every day | ORAL | Status: DC
  Filled 2014-01-17: qty 1

## 2014-01-17 SURGICAL SUPPLY — 33 items
CANISTER SUCT LVC 12 LTR MEDI- (MISCELLANEOUS) ×2 IMPLANT
CANISTER SUCTION 2500CC (MISCELLANEOUS) ×2 IMPLANT
CANISTER WOUND CARE 500ML ATS (WOUND CARE) ×3 IMPLANT
COVER SURGICAL LIGHT HANDLE (MISCELLANEOUS) ×2 IMPLANT
DRAPE LAPAROSCOPIC ABDOMINAL (DRAPES) ×2 IMPLANT
DRAPE UTILITY 15X26 W/TAPE STR (DRAPE) ×4 IMPLANT
DRSG VAC ATS MED SENSATRAC (GAUZE/BANDAGES/DRESSINGS) IMPLANT
DRSG VAC ATS SM SENSATRAC (GAUZE/BANDAGES/DRESSINGS) ×1 IMPLANT
ELECT REM PT RETURN 9FT ADLT (ELECTROSURGICAL) ×2
ELECTRODE REM PT RTRN 9FT ADLT (ELECTROSURGICAL) ×1 IMPLANT
GAUZE SPONGE 2X2 8PLY STRL LF (GAUZE/BANDAGES/DRESSINGS) IMPLANT
GLOVE BIO SURGEON STRL SZ7.5 (GLOVE) ×2 IMPLANT
GLOVE BIOGEL PI IND STRL 7.0 (GLOVE) IMPLANT
GLOVE BIOGEL PI INDICATOR 7.0 (GLOVE) ×1
GLOVE SURG SS PI 7.0 STRL IVOR (GLOVE) ×1 IMPLANT
GOWN STRL REUS W/ TWL LRG LVL3 (GOWN DISPOSABLE) ×2 IMPLANT
GOWN STRL REUS W/TWL LRG LVL3 (GOWN DISPOSABLE) ×4
KIT BASIN OR (CUSTOM PROCEDURE TRAY) ×2 IMPLANT
KIT ROOM TURNOVER OR (KITS) ×2 IMPLANT
NS IRRIG 1000ML POUR BTL (IV SOLUTION) ×4 IMPLANT
PACK GENERAL/GYN (CUSTOM PROCEDURE TRAY) ×2 IMPLANT
PAD ARMBOARD 7.5X6 YLW CONV (MISCELLANEOUS) ×4 IMPLANT
SPONGE ABDOMINAL VAC ABTHERA (MISCELLANEOUS) ×2 IMPLANT
SPONGE GAUZE 2X2 STER 10/PKG (GAUZE/BANDAGES/DRESSINGS) ×1
SUCTION POOLE TIP (SUCTIONS) ×2 IMPLANT
SUT ETHILON 1 LR 30 (SUTURE) IMPLANT
SUT NOVA 1 T20/GS 25DT (SUTURE) IMPLANT
SUT PDS AB 1 TP1 96 (SUTURE) IMPLANT
SUT RET BRIDGE (SUTURE) IMPLANT
TAPE CLOTH SOFT 2X10 (GAUZE/BANDAGES/DRESSINGS) ×1 IMPLANT
TOWEL OR 17X24 6PK STRL BLUE (TOWEL DISPOSABLE) ×2 IMPLANT
TOWEL OR 17X26 10 PK STRL BLUE (TOWEL DISPOSABLE) ×2 IMPLANT
WATER STERILE IRR 1000ML POUR (IV SOLUTION) ×2 IMPLANT

## 2014-01-17 NOTE — Transfer of Care (Signed)
Immediate Anesthesia Transfer of Care Note  Patient: [name]  Procedure(s) Performed: Procedure(s): PLACEMENT OF VAC DRESSING (Left) DEBRIDEMENT OF CHEST WALL (Left)  Patient Location: PACU  Anesthesia Type:General  Level of Consciousness: sedated, patient cooperative and responds to stimulation  Airway & Oxygen Therapy: Patient Spontanous Breathing  Post-op Assessment: Report given to PACU RN, Post -op Vital signs reviewed and stable and Patient moving all extremities X 4  Post vital signs: Reviewed and stable  Complications: No apparent anesthesia complications

## 2014-01-17 NOTE — Op Note (Signed)
01/17/2014  11:58 AM  PATIENT:  [name]  67 y.o. female  PRE-OPERATIVE DIAGNOSIS:  skin flap necrosis left chest wall  POST-OPERATIVE DIAGNOSIS:  skin flap necrosis left chest wall  PROCEDURE:  Procedure(s): PLACEMENT OF VAC DRESSING (Left) DEBRIDEMENT OF CHEST WALL (Left)  SURGEON:  Surgeon(s) and Role:    * Merrie Roof, MD - Primary  PHYSICIAN ASSISTANT:   ASSISTANTS: none   ANESTHESIA:   general  EBL:     BLOOD ADMINISTERED:none  DRAINS: none   LOCAL MEDICATIONS USED:  NONE  SPECIMEN:  No Specimen  DISPOSITION OF SPECIMEN:  N/A  COUNTS:  YES  TOURNIQUET:  * No tourniquets in log *  DICTATION: .Dragon Dictation After informed consent was obtained the patient was brought to the operating room and placed in the supine position on the operating table. After adequate induction of general anesthesia the patient's left chest was prepped with Betadine and draped in usual sterile manner. On the superior medial skin flap there was an area of necrosis. The skin and subcutaneous fat in this area were debrided sharply with Metzenbaum scissors. The defect measured approximately 4 cm x 4 cm x 2 cm deep. Both skin and subcutaneous fat were debrided away. The chest wall was intact. Once the wound was clean a small VAC sponge was used and cut to the appropriate size to fit in the wound. The vacuum dressing was then applied and there was a good seal when placed 110mm hg suction. The patient tolerated the procedure well. At the end of the case all needle sponge and instrument counts are correct. The patient was then awakened and taken to recovery in stable condition. 1.  Progress note or procedure note with a detailed description of the procedure.  2.  Tool used for debridement (curette, scapel, etc.)  Metzenbaum scissors  3.  Frequency of surgical debridement.   once  4.  Measurement of total devitalized tissue (wound surface) before and after surgical debridement.    4cmX4cmX2cm  5.  Area and depth of devitalized tissue removed from wound.  See above  6.  Blood loss and description of tissue removed.  minimal  7.  Evidence of the progress of the wound's response to treatment.  A.  Current wound volume (current dimensions and depth).  As above  B.  Presence (and extent of) of infection.  no  C.  Presence (and extent of) of non viable tissue.  yes  D.  Other material in the wound that is expected to inhibit healing.  none  8.  Was there any viable tissue removed (measurements): no  PLAN OF CARE: Admit for overnight observation  PATIENT DISPOSITION:  PACU - hemodynamically stable.   Delay start of Pharmacological VTE agent (>24hrs) due to surgical blood loss or risk of bleeding: no

## 2014-01-17 NOTE — Anesthesia Postprocedure Evaluation (Signed)
  Anesthesia Post-op Note  Patient: [name]  Procedure(s) Performed: Procedure(s): PLACEMENT OF VAC DRESSING (Left) DEBRIDEMENT OF CHEST WALL (Left)  Patient Location: PACU  Anesthesia Type:General  Level of Consciousness: awake, sedated and patient cooperative  Airway and Oxygen Therapy: Patient Spontanous Breathing  Post-op Pain: mild  Post-op Assessment: Post-op Vital signs reviewed, Patient's Cardiovascular Status Stable, Respiratory Function Stable, Patent Airway, No signs of Nausea or vomiting and Pain level controlled  Post-op Vital Signs: stable  Last Vitals:  Filed Vitals:   01/17/14 1326  BP: 101/73  Pulse: 60  Temp: 36.4 C  Resp: 16    Complications: No apparent anesthesia complications

## 2014-01-17 NOTE — H&P (View-Only) (Signed)
Subjective:     Patient ID: [name], female   DOB: 12/02/1945, 68 y.o.   MRN: [id number]  HPI The patient is a 68 her black female who is one month status post left mastectomy and negative sentinel node biopsy for a T1 C. N0 left breast cancer. She was ER and PR positive and HER-2/neu negative with a Ki-67 9%. Her postoperative course has been complicated by a small area of skin flap necrosis on the superior-medial flap. She returns today for a dressing change. She continues to have some clear yellowish drainage from the wound.  Review of Systems  Constitutional: Negative.   HENT: Negative.   Eyes: Negative.   Respiratory: Negative.   Cardiovascular: Negative.   Gastrointestinal: Negative.   Endocrine: Negative.   Genitourinary: Negative.   Musculoskeletal: Negative.   Skin: Negative.   Allergic/Immunologic: Negative.   Neurological: Negative.   Hematological: Negative.   Psychiatric/Behavioral: Negative.        Objective:   Physical Exam  Constitutional: She is oriented to person, place, and time. She appears well-developed and well-nourished.  HENT:  Head: Normocephalic and atraumatic.  Eyes: Conjunctivae and EOM are normal. Pupils are equal, round, and reactive to light.  Neck: Normal range of motion. Neck supple.  Cardiovascular: Normal rate, regular rhythm and normal heart sounds.   Pulmonary/Chest: Effort normal and breath sounds normal.  There is a small area of skin flap necrosis on the superior-medial flap. The mastectomy cavity is exposed.  Abdominal: Soft. Bowel sounds are normal.  Musculoskeletal: Normal range of motion.  Lymphadenopathy:    She has no cervical adenopathy.  Neurological: She is alert and oriented to person, place, and time.  Skin: Skin is warm and dry.  Psychiatric: She has a normal mood and affect. Her behavior is normal.       Assessment:     The patient is one month status post left mastectomy for breast cancer complicated by a small  area of skin flap necrosis     Plan:     At this point I think it would be best to debridement the area of skin flap necrosis and then apply a vacuum dressing to help with healing. I've discussed with her in detail the risks and benefits the operation to do this as well as some of the technical aspects and she understands and wishes to proceed       

## 2014-01-17 NOTE — Anesthesia Preprocedure Evaluation (Addendum)
Anesthesia Evaluation  Patient identified by MRN, date of birth, ID band Patient awake    Reviewed: Allergy & Precautions, H&P , NPO status , Patient's Chart, lab work & pertinent test results, reviewed documented beta blocker date and time   History of Anesthesia Complications Negative for: history of anesthetic complications  Airway Mallampati: II TM Distance: >3 FB Neck ROM: Full    Dental  (+) Dental Advisory Given, Edentulous Lower   Pulmonary asthma , former smoker,    Pulmonary exam normal       Cardiovascular hypertension, Pt. on medications and Pt. on home beta blockers + Peripheral Vascular Disease Rhythm:Regular     Neuro/Psych PSYCHIATRIC DISORDERS Anxiety Depression negative neurological ROS     GI/Hepatic negative GI ROS, Neg liver ROS,   Endo/Other  diabetes, Well Controlled, Type 2, Oral Hypoglycemic AgentsMorbid obesity  Renal/GU negative Renal ROS     Musculoskeletal negative musculoskeletal ROS (+)   Abdominal   Peds  Hematology   Anesthesia Other Findings   Reproductive/Obstetrics negative OB ROS                          Anesthesia Physical  Anesthesia Plan  ASA: III  Anesthesia Plan: General   Post-op Pain Management:    Induction: Intravenous  Airway Management Planned: LMA and Oral ETT  Additional Equipment: None  Intra-op Plan:   Post-operative Plan: Extubation in OR  Informed Consent: I have reviewed the patients History and Physical, chart, labs and discussed the procedure including the risks, benefits and alternatives for the proposed anesthesia with the patient or authorized representative who has indicated his/her understanding and acceptance.   Dental advisory given  Plan Discussed with: CRNA, Surgeon and Anesthesiologist  Anesthesia Plan Comments:        Anesthesia Quick Evaluation

## 2014-01-17 NOTE — Interval H&P Note (Signed)
History and Physical Interval Note:  01/17/2014 11:08 AM  [name]  has presented today for surgery, with the diagnosis of skin flap necrosis  The various methods of treatment have been discussed with the patient and family. After consideration of risks, benefits and other options for treatment, the patient has consented to  Procedure(s): PLACEMENT OF VAC DRESSING (N/A) DEBRIDEMENT OF CHEST WALL (N/A) as a surgical intervention .  The patient's history has been reviewed, patient examined, no change in status, stable for surgery.  I have reviewed the patient's chart and labs.  Questions were answered to the patient's satisfaction.     TOTH III,[name]

## 2014-01-17 NOTE — Care Management Note (Signed)
  Page 1 of 1   01/18/2014     10:25:45 AM CARE MANAGEMENT NOTE 01/18/2014  Patient:  [name], [name]   Account Number:  [id number]  Date Initiated:  01/17/2014  Documentation initiated by:  Magdalen Spatz  Subjective/Objective Assessment:     Action/Plan:   Anticipated DC Date:  01/18/2014   Anticipated DC Plan:  [place]         Choice offered to / List presented to:  C-1 Patient        [place] arranged  HH-1 RN      [place].   Status of service:  Completed, signed off Medicare Important Message given?   (If response is "NO", the following Medicare IM given date fields will be blank) Date Medicare IM given:   Medicare IM given by:   Date Additional Medicare IM given:   Additional Medicare IM given by:    Discharge Disposition:    Per UR Regulation:    If discussed at Long Length of Stay Meetings, dates discussed:    Comments:  01-17-14 Spoke to patient and her grand daughter Andee Poles at bedside.  Discussed home health . Confirmed face sheet information .  Provided list of home health agencies , they will discuss and make decision later.  VAC application left in shadow chart for MD signature. DR Marlou Starks aware and will sign today .  Will follow up later this afternoon.  Magdalen Spatz RN BSN

## 2014-01-18 ENCOUNTER — Encounter (HOSPITAL_COMMUNITY): Payer: Self-pay | Admitting: General Surgery

## 2014-01-18 DIAGNOSIS — L988 Other specified disorders of the skin and subcutaneous tissue: Secondary | ICD-10-CM | POA: Diagnosis not present

## 2014-01-18 LAB — GLUCOSE, CAPILLARY
GLUCOSE-CAPILLARY: 197 mg/dL — AB (ref 70–99)
GLUCOSE-CAPILLARY: 185 mg/dL — AB (ref 70–99)
Glucose-Capillary: 226 mg/dL — ABNORMAL HIGH (ref 70–99)
Glucose-Capillary: 184 mg/dL — ABNORMAL HIGH (ref 70–99)

## 2014-01-18 NOTE — Progress Notes (Signed)
Discharge instructions reviewed with pt and pt's daughter at the bedside. Rx given, wound vac reviewed. Home health will change dressing Monday. Pt ready for discharge.

## 2014-01-18 NOTE — Progress Notes (Signed)
1 Day Post-Op  Subjective: No complaints  Objective: Vital signs in last 24 hours: Temp:  [97.4 F (36.3 C)-98.1 F (36.7 C)] 97.7 F (36.5 C) (07/17 0439) Pulse Rate:  [59-69] 69 (07/17 0439) Resp:  [10-18] 18 (07/17 0439) BP: (96-126)/(54-73) 96/54 mmHg (07/17 0439) SpO2:  [89 %-97 %] 96 % (07/17 0439) Weight:  [191 lb 12.8 oz (87 kg)] 191 lb 12.8 oz (87 kg) (07/16 0939)    Intake/Output from previous day: 07/16 0701 - 07/17 0700 In: 1420 [P.O.:720; I.V.:700] Out: 760 [Urine:750; Blood:10] Intake/Output this shift: Total I/O In: -  Out: 750 [Urine:750]  Resp: clear to auscultation bilaterally Cardio: regular rate and rhythm GI: soft, non-tender; bowel sounds normal; no masses,  no organomegaly  Lab Results:   Recent Labs  01/17/14 0929  WBC 6.7  HGB 14.0  HCT 42.4  PLT 353   BMET  Recent Labs  01/17/14 0929  NA 138  K 3.6*  CL 91*  CO2 30  GLUCOSE 222*  BUN 11  CREATININE 0.91  CALCIUM 10.1   PT/INR No results found for this basename: LABPROT, INR,  in the last 72 hours ABG No results found for this basename: PHART, PCO2, PO2, HCO3,  in the last 72 hours  Studies/Results: No results found.  Anti-infectives: Anti-infectives   Start     Dose/Rate Route Frequency Ordered Stop   01/17/14 1200  ceFAZolin (ANCEF) IVPB 2 g/50 mL premix     2 g 100 mL/hr over 30 Minutes Intravenous  Once 01/17/14 1148 01/17/14 1129   01/17/14 0930  ceFAZolin (ANCEF) IVPB 2 g/50 mL premix  Status:  Discontinued     2 g 100 mL/hr over 30 Minutes Intravenous On call to O.R. 01/17/14 0924 01/17/14 1314      Assessment/Plan: s/p Procedure(s): PLACEMENT OF VAC DRESSING (Left) DEBRIDEMENT OF CHEST WALL (Left) Discharge once home health is arranged  LOS: 1 day    TOTH III,PAUL S 01/18/2014

## 2014-01-18 NOTE — Discharge Summary (Signed)
Physician Discharge Summary  Patient ID: [name] MRN: [id number] DOB/AGE: [birth year] 68 y.o.  Admit date: 01/17/2014 Discharge date: 01/18/2014  Admission Diagnoses:  Discharge Diagnoses:  Active Problems:   Skin flap necrosis   Discharged Condition: good  Hospital Course: the pt is one month s/p left mastectomy. She had a small area medially of skin flap necrosis. This was debrided yesterday and a vacuum dressing was applied. She tolerated this well. Once home health is arranged she may be discharged.  Consults: None  Significant Diagnostic Studies: none  Treatments: surgery: as above  Discharge Exam: Blood pressure 96/54, pulse 69, temperature 97.7 F (36.5 C), temperature source Oral, resp. rate 18, height 5\' 3"  (1.6 m), weight 191 lb 12.8 oz (87 kg), SpO2 96.00%. Resp: clear to auscultation bilaterally Cardio: regular rate and rhythm GI: soft, non-tender; bowel sounds normal; no masses,  no organomegaly  Disposition: 01-Home or Self Care  Discharge Instructions   Call MD for:  difficulty breathing, headache or visual disturbances    Complete by:  As directed      Call MD for:  extreme fatigue    Complete by:  As directed      Call MD for:  hives    Complete by:  As directed      Call MD for:  persistant dizziness or light-headedness    Complete by:  As directed      Call MD for:  persistant nausea and vomiting    Complete by:  As directed      Call MD for:  redness, tenderness, or signs of infection (pain, swelling, redness, odor or green/yellow discharge around incision site)    Complete by:  As directed      Call MD for:  severe uncontrolled pain    Complete by:  As directed      Call MD for:  temperature >100.4    Complete by:  As directed      Diet - low sodium heart healthy    Complete by:  As directed      Discharge instructions    Complete by:  As directed   Home health will change vac every M,W,F     Increase activity slowly    Complete by:  As  directed      No wound care    Complete by:  As directed             Medication List         albuterol 108 (90 BASE) MCG/ACT inhaler  Commonly known as:  PROVENTIL HFA;VENTOLIN HFA  Inhale 1-2 puffs into the lungs every 4 (four) hours as needed for wheezing or shortness of breath.     ALPRAZolam 1 MG tablet  Commonly known as:  XANAX  Take 1 tablet (1 mg total) by mouth 3 (three) times daily as needed for sleep or anxiety.     amLODipine 10 MG tablet  Commonly known as:  NORVASC  Take 1 tablet (10 mg total) by mouth daily.     atenolol 50 MG tablet  Commonly known as:  TENORMIN  Take 2 tablets (100 mg total) by mouth daily.     hydrochlorothiazide 12.5 MG capsule  Commonly known as:  MICROZIDE  Take 1 capsule (12.5 mg total) by mouth daily.     HYDROcodone-acetaminophen 5-325 MG per tablet  Commonly known as:  NORCO/VICODIN  Take 1-2 tablets by mouth every 6 (six) hours as needed for moderate pain.  metFORMIN 500 MG 24 hr tablet  Commonly known as:  GLUCOPHAGE XR  Take 1 tablet (500 mg total) by mouth daily.     oxyCODONE-acetaminophen 5-325 MG per tablet  Commonly known as:  PERCOCET/ROXICET  Take 1-2 tablets by mouth every 4 (four) hours as needed for severe pain.     sulfacetamide-prednisoLONE 10-0.23 % ophthalmic solution  Commonly known as:  VASOCIDIN  Place 1 drop into both eyes daily as needed (allergies).     VITAMIN D PO  Take 1 capsule by mouth daily.           Follow-up Information   Follow up with Merrie Roof, MD In 2 weeks.   Specialty:  General Surgery   Contact information:   [address] [place] [address] [phone]       Signed: Merrie Roof 01/18/2014, 6:54 AM

## 2014-01-21 ENCOUNTER — Encounter: Payer: Self-pay | Admitting: *Deleted

## 2014-01-21 NOTE — Progress Notes (Signed)
Received Oncotype Dx results of 6.  Sent a copy to HIM to scan.

## 2014-01-29 ENCOUNTER — Telehealth: Payer: Self-pay | Admitting: Medical Oncology

## 2014-01-29 NOTE — Telephone Encounter (Signed)
Called patient to confirm appt for tomorrow with Dr. Alen Blew. Informed patient to bring current list of medications. Patient expressed understanding, denies questions.

## 2014-01-30 ENCOUNTER — Other Ambulatory Visit: Payer: Medicare Other

## 2014-01-30 ENCOUNTER — Ambulatory Visit: Payer: Medicare Other

## 2014-01-30 ENCOUNTER — Telehealth: Payer: Self-pay | Admitting: Oncology

## 2014-01-30 ENCOUNTER — Ambulatory Visit (HOSPITAL_BASED_OUTPATIENT_CLINIC_OR_DEPARTMENT_OTHER): Payer: Medicare Other | Admitting: Oncology

## 2014-01-30 ENCOUNTER — Encounter: Payer: Self-pay | Admitting: Oncology

## 2014-01-30 VITALS — BP 141/70 | HR 73 | Temp 98.3°F | Resp 20 | Ht 63.0 in | Wt 191.8 lb

## 2014-01-30 DIAGNOSIS — I10 Essential (primary) hypertension: Secondary | ICD-10-CM

## 2014-01-30 DIAGNOSIS — Z170 Estrogen receptor positive status [ER+]: Secondary | ICD-10-CM

## 2014-01-30 DIAGNOSIS — E119 Type 2 diabetes mellitus without complications: Secondary | ICD-10-CM

## 2014-01-30 DIAGNOSIS — C50919 Malignant neoplasm of unspecified site of unspecified female breast: Secondary | ICD-10-CM

## 2014-01-30 DIAGNOSIS — C50212 Malignant neoplasm of upper-inner quadrant of left female breast: Secondary | ICD-10-CM

## 2014-01-30 NOTE — Progress Notes (Signed)
Please see consult note.  

## 2014-01-30 NOTE — Consult Note (Signed)
Reason for Referral: Breast Cancer.   HPI: 68 year old African American woman currently of [place] where she lived the majority of her life. She has a past medical history significant for hypertension and diabetes and maintained her breast cancer screening with routine mammography. Her last mammogram in 11/2013 showed a 9 mm area of abnormality in the medial left breast this was biopsied and came back as grade 1 invasive ductal cancer. The tumor was ER PR positive and HER-2/neu negative. Her Ki-67 was 15%. Her MRI estimated the size of the area at 1.3 cm but also showed some enhancement coming off the anterior portion of the ascending towards the nipple that encompassed an area of 8.8 cm. 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 There was also lobular neoplasia described as atypical lobular hyperplasia. Based on these findings of multifocal disease, she underwent a left mastectomy with sentinel lymph node biopsy done on 12/20/2013 under the care of Dr. Marlou Starks. The final pathology showed a invasive ductal carcinoma grade I/III spanning 1.2 cm and 0.35 cm. The surgical resection margins are negative for ductal carcinoma. The lymph node sample did not show any malignancy with her Ki-67 at 9%. The pathological staging was T1c.N0. Her Oncotype DX recurrent score was 6. She tolerated the procedure well but subsequently developed skin flap necrosis of the left chest wall and underwent a debridement and VAC placement on 01/17/2014. Patient referred to me for further evaluation regarding adjuvant therapy. She does not have any family history of breast cancer.  Clinically, she is asymptomatic at this point. She is not reporting any chest wall pain or discomfort. She is reporting no further drainage since the [facility] placement. She does not report any axillary discomfort or arm swelling. She has not reported headaches or blurry  vision or double vision. Does not report any neurological symptoms or syncope. She does not report any headaches or change in her mentation. She does not report any fevers or chills or sweats. Her appetite and performance status remains excellent. She does not report any chest pain palpitation or orthopnea. She does not report any shortness of breath cough does reports occasional wheezing and has an inhaler for that. She does not report any nausea or vomiting or abdominal pain. His appetite constipation or diarrhea. She reports no frequency urgency or hesitancy or hematuria. She does not report any skeletal complaints of arthralgias or myalgias. She is not reporting lymphadenopathy or petechiae. She continues to live independently and drives up to the point before her surgery.   Past Medical History  Diagnosis Date  . Allergy   . Depression   . Asthma   . Hypertension   . Anxiety   . Breast cancer     S/P left mastectomy 2015  . Type II diabetes mellitus   . Migraine 1980's  . 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 Tubal ligation  1973  . Total hip arthroplasty Left   . Total hip arthroplasty Right   . Orif shoulder fracture Left 1964  . Rotator cuff repair Right   . Ankle fracture surgery Left 03/15/2000    with hardware  . Joint replacement    . Mastectomy w/ sentinel node biopsy Left 12/20/2013    Procedure: MASTECTOMY WITH SENTINEL LYMPH NODE BIOPSY;  Surgeon: Merrie Roof, MD;  Location: [place];  Service: General;  Laterality: Left;  . Colonoscopy    . Application of wound vac  01/17/2014    w/chest wall debridement  . Mastectomy complete / simple w/ sentinel node biopsy Left   . Abdominal hysterectomy  1981    "fibroids"  . Application of wound vac Left 01/17/2014    Procedure: PLACEMENT OF VAC DRESSING;  Surgeon: Merrie Roof, MD;  Location: [place];  Service: General;  Laterality: Left;  . Wound debridement Left 01/17/2014     Procedure: DEBRIDEMENT OF CHEST WALL;  Surgeon: Merrie Roof, MD;  Location: [place];  Service: General;  Laterality: Left;  :  Current Outpatient Prescriptions  Medication Sig Dispense Refill  . albuterol (PROVENTIL HFA;VENTOLIN HFA) 108 (90 BASE) MCG/ACT inhaler Inhale 1-2 puffs into the lungs every 4 (four) hours as needed for wheezing or shortness of breath.      . ALPRAZolam (XANAX) 1 MG tablet Take 1 tablet (1 mg total) by mouth 3 (three) times daily as needed for sleep or anxiety.  90 tablet  1  . amLODipine (NORVASC) 10 MG tablet Take 1 tablet (10 mg total) by mouth daily.  90 tablet  1  . atenolol (TENORMIN) 50 MG tablet Take 2 tablets (100 mg total) by mouth daily.  180 tablet  1  . Cholecalciferol (VITAMIN D PO) Take 1 capsule by mouth daily.      . hydrochlorothiazide (MICROZIDE) 12.5 MG capsule Take 1 capsule (12.5 mg total) by mouth daily.  90 capsule  1  . HYDROcodone-acetaminophen (NORCO/VICODIN) 5-325 MG per tablet Take 1-2 tablets by mouth every 6 (six) hours as needed for moderate pain.      . metFORMIN (GLUCOPHAGE XR) 500 MG 24 hr tablet Take 1 tablet (500 mg total) by mouth daily.  90 tablet  1  . oxyCODONE-acetaminophen (PERCOCET/ROXICET) 5-325 MG per tablet Take 1-2 tablets by mouth every 4 (four) hours as needed for severe pain.      Marland Kitchen sulfacetamide-prednisoLONE (VASOCIDIN) 10-0.23 % ophthalmic solution Place 1 drop into both eyes daily as needed (allergies).        No current facility-administered medications for this visit.        Allergies  Allergen Reactions  . Ace Inhibitors Other (See Comments)    Acute renal failure  . Angiotensin Receptor Blockers Other (See Comments)    Acute Renal Failure  :  Family History  Problem Relation Age of Onset  . Asthma Mother   . Stroke Mother   . Asthma Father   . Alcohol abuse Father   . Colon cancer Neg Hx   :  History   Social History  . Marital Status: Divorced    Spouse Name: n/a    Number of Children:  1  . Years of Education: 12   Occupational History  . retired     Network engineer   Social History Main Topics  . Smoking status: Former Smoker -- 0.50 packs/day for 40 years    Types: Cigarettes    Quit date: 11/11/2000  . Smokeless tobacco: Never Used  . Alcohol Use: Yes     Comment: 01/17/2014 "haven't drank anything since ~ 10/2013 when dx'd w/cancer"  . Drug Use: Yes    Special: Marijuana     Comment: "last smoked marijuana in ~ 2010; recreational user only"  . Sexual Activity: No   Other Topics Concern  . Not on file   Social History Narrative   Her daughter and granddaughter live with  her.  :  Pertinent items are noted in HPI.  Exam: ECOG 0 Blood pressure 141/70, pulse 73, temperature 98.3 F (36.8 C), temperature source Oral, resp. rate 20, height 5' 3"  (1.6 m), weight 191 lb 12.8 oz (87 kg). General appearance: alert and cooperative Head: Normocephalic, without obvious abnormality Throat: lips, mucosa, and tongue normal; teeth and gums normal Neck: no adenopathy Back: symmetric, no curvature. ROM normal. No CVA tenderness. Resp: clear to auscultation bilaterally Chest wall: no tenderness, No masses or tenderness. No lymphadenopathy. Breasts: Right breast without any masses or lesions. Status post mastectomy of the left without any nodules. Healing wound without any drainage. Cardio: regular rate and rhythm, S1, S2 normal, no murmur, click, rub or gallop GI: soft, non-tender; bowel sounds normal; no masses,  no organomegaly Extremities: extremities normal, atraumatic, no cyanosis or edema Pulses: 2+ and symmetric Skin: Skin color, texture, turgor normal. No rashes or lesions Lymph nodes: Cervical, supraclavicular, and axillary nodes normal.    Assessment and Plan:   68 year old woman with the diagnosis of multifocal left-sided breast cancer. She has an invasive ductal carcinoma measuring 1.2 cm grade I/III that is ER/PR positive HER-2 negative.  Ki-67 of 9%. She has a low recurrence score of 6 on her Oncotype DX. 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 She is status post left mastectomy in June of 2015 followed by debridements in July of 2015.   The natural course of this disease was discussed today excessively with the patient today. I explained to her given the fact that her tumor have been detected at an early stage she is reasonable chance of cure without any further intervention. The role of adjuvant chemotherapy was discussed today and given her recurrence score being below indicating a low risk features additional chemotherapy will add very little at this point.  The role of adjuvant hormonal therapy was discussed extensively including the rationale and the abundance of clinical trials that supports the use of aromatase inhibitors in this setting. The benefit is marginal but incremental in nature I estimated around 6-10% improvement in relapse rates. I explained to her that these medications offer her contralateral breast recurrence protection as well as potential systemic disease protection. Complications from these medications were explained including osteoporosis, arthralgias and myalgias and potentially cardiovascular toxicity. All her questions were answered regarding these medications and brain information given to the patient about specifically Arimidex. She'll be willing to proceed after her wound healing issues have have resolved. I will see her back in about 4 weeks and probably start her at that time.

## 2014-01-30 NOTE — Progress Notes (Signed)
Checked in new pt with no financial concerns. °

## 2014-01-30 NOTE — Telephone Encounter (Signed)
gv and printed appt sched and avs for pt for Aug °

## 2014-02-01 ENCOUNTER — Ambulatory Visit (INDEPENDENT_AMBULATORY_CARE_PROVIDER_SITE_OTHER): Payer: Medicare Other | Admitting: General Surgery

## 2014-02-01 VITALS — BP 114/76 | HR 74 | Temp 97.4°F | Ht 65.0 in | Wt 190.0 lb

## 2014-02-01 DIAGNOSIS — C50212 Malignant neoplasm of upper-inner quadrant of left female breast: Secondary | ICD-10-CM

## 2014-02-01 DIAGNOSIS — C50219 Malignant neoplasm of upper-inner quadrant of unspecified female breast: Secondary | ICD-10-CM

## 2014-02-01 NOTE — Patient Instructions (Signed)
Continue vac for another week

## 2014-02-04 NOTE — Progress Notes (Signed)
Subjective:     Patient ID: [name], female   DOB: September 04, 1945, 68 y.o.   MRN: [id number]  HPI The patient is a 68 year old black female who is about 6 weeks status post left mastectomy for a T1 C. N0 left breast cancer. Her course was complicated by a small area of skin flap necrosis on the superior-medial flap. I took her back to the operating room last week to clean the area and place a vacuum dressing. She has been tolerating this well.  Review of Systems     Objective:   Physical Exam On exam today the vacuum dressing was removed. The wound appears to be clean and most of the cavity has collapsed down now. I reapplied the vacuum dressing and she tolerated this well.    Assessment:     The patient is 6 weeks status post left mastectomy for breast cancer     Plan:     At this point we will continue with the vacuum dressing for another week. I will plan to see her back next week to reassess the wound.

## 2014-02-08 ENCOUNTER — Ambulatory Visit (INDEPENDENT_AMBULATORY_CARE_PROVIDER_SITE_OTHER): Payer: Medicare Other | Admitting: General Surgery

## 2014-02-08 ENCOUNTER — Encounter (INDEPENDENT_AMBULATORY_CARE_PROVIDER_SITE_OTHER): Payer: Self-pay | Admitting: General Surgery

## 2014-02-08 DIAGNOSIS — C50212 Malignant neoplasm of upper-inner quadrant of left female breast: Secondary | ICD-10-CM

## 2014-02-08 DIAGNOSIS — C50219 Malignant neoplasm of upper-inner quadrant of unspecified female breast: Secondary | ICD-10-CM

## 2014-02-08 NOTE — Patient Instructions (Signed)
Shower daily and cover wound with clean gauze

## 2014-02-08 NOTE — Progress Notes (Signed)
Subjective:     Patient ID: [name], female   DOB: 27-Jan-1946, 68 y.o.   MRN: [id number]  HPI The patient is a 68 year old black female who is 7 weeks status post left mastectomy and negative sentinel node mapping for a T1 C. N0 left breast cancer. Her postoperative course was complicated by a small area of skin necrosis on the superior-medial flap. She Has had a vacuum dressing on for the last couple weeks and has tolerated this well.  Review of Systems     Objective:   Physical Exam On exam her left mastectomy incision is healing well. The vacuum dressing was removed and the wound is now flat with good granulation tissue. There is no tunneling or depth to the wound.    Assessment:     The patient is 7 weeks status post left mastectomy for breast cancer     Plan:     At this point we will discontinue the vacuum dressing. She will shower daily and to the wound covered by clean gauze. I will plan to see her back in 2 weeks for a wound check.

## 2014-02-12 ENCOUNTER — Telehealth (INDEPENDENT_AMBULATORY_CARE_PROVIDER_SITE_OTHER): Payer: Self-pay

## 2014-02-12 NOTE — Telephone Encounter (Signed)
Barbara from [facility] called asking to clarify dressing change to wet to dry with NS. They will go out only 2 times a week now that [facility] was D/C

## 2014-02-22 ENCOUNTER — Ambulatory Visit (INDEPENDENT_AMBULATORY_CARE_PROVIDER_SITE_OTHER): Payer: Medicare Other | Admitting: General Surgery

## 2014-02-22 ENCOUNTER — Encounter (INDEPENDENT_AMBULATORY_CARE_PROVIDER_SITE_OTHER): Payer: Self-pay | Admitting: General Surgery

## 2014-02-22 VITALS — BP 130/72 | HR 75 | Temp 97.0°F | Ht 65.0 in | Wt 195.0 lb

## 2014-02-22 DIAGNOSIS — C50212 Malignant neoplasm of upper-inner quadrant of left female breast: Secondary | ICD-10-CM

## 2014-02-22 DIAGNOSIS — Z5189 Encounter for other specified aftercare: Secondary | ICD-10-CM

## 2014-02-22 DIAGNOSIS — T85698A Other mechanical complication of other specified internal prosthetic devices, implants and grafts, initial encounter: Secondary | ICD-10-CM

## 2014-02-22 DIAGNOSIS — T888XXD Other specified complications of surgical and medical care, not elsewhere classified, subsequent encounter: Secondary | ICD-10-CM

## 2014-02-22 DIAGNOSIS — C50219 Malignant neoplasm of upper-inner quadrant of unspecified female breast: Secondary | ICD-10-CM

## 2014-02-22 DIAGNOSIS — I96 Gangrene, not elsewhere classified: Secondary | ICD-10-CM

## 2014-02-22 NOTE — Patient Instructions (Signed)
Shower daily and cover with dry clean gauze

## 2014-02-22 NOTE — Progress Notes (Signed)
Subjective:     Patient ID: [name], female   DOB: [birth year], 68 y.o.   MRN: [id number]  HPI The patient is a 68yo black female who is 2 months s/p left mastectomy and negative sentinel node mapping for a T1cN0 left breast cancer. Her postop course was complicated by a small area of flap necrosis on the superior medial skin flap. She had a vacuum dressing for a couple weeks and is now doing dressing changes. She has no complaints today  Review of Systems     Objective:   Physical Exam On exams most of the mastectomy incision is healing well. The wound on the superior medial flap is small and flat with good granulation tissue and very clean.    Assessment:     The patient is 2 months s/p left mastectomy complicated by a small area of flap necrosis     Plan:     At this point the wound is very small and flat so I would recommend that she shower daily and keep area covered with clean dry gauze. She can stop home health. I will see her back in 1 month

## 2014-02-27 ENCOUNTER — Other Ambulatory Visit (HOSPITAL_BASED_OUTPATIENT_CLINIC_OR_DEPARTMENT_OTHER): Payer: Medicare Other

## 2014-02-27 ENCOUNTER — Encounter: Payer: Self-pay | Admitting: Oncology

## 2014-02-27 ENCOUNTER — Ambulatory Visit (HOSPITAL_BASED_OUTPATIENT_CLINIC_OR_DEPARTMENT_OTHER): Payer: Medicare Other | Admitting: Oncology

## 2014-02-27 ENCOUNTER — Telehealth: Payer: Self-pay | Admitting: Oncology

## 2014-02-27 VITALS — BP 122/66 | HR 66 | Temp 97.8°F | Resp 17 | Ht 65.0 in | Wt 195.8 lb

## 2014-02-27 DIAGNOSIS — C50919 Malignant neoplasm of unspecified site of unspecified female breast: Secondary | ICD-10-CM

## 2014-02-27 DIAGNOSIS — C50212 Malignant neoplasm of upper-inner quadrant of left female breast: Secondary | ICD-10-CM

## 2014-02-27 DIAGNOSIS — Z170 Estrogen receptor positive status [ER+]: Secondary | ICD-10-CM

## 2014-02-27 LAB — CBC WITH DIFFERENTIAL/PLATELET
BASO%: 0.2 % (ref 0.0–2.0)
Basophils Absolute: 0 10*3/uL (ref 0.0–0.1)
EOS%: 6.7 % (ref 0.0–7.0)
Eosinophils Absolute: 0.4 10*3/uL (ref 0.0–0.5)
HEMATOCRIT: 43.5 % (ref 34.8–46.6)
HGB: 14.1 g/dL (ref 11.6–15.9)
LYMPH%: 37.4 % (ref 14.0–49.7)
MCH: 29.4 pg (ref 25.1–34.0)
MCHC: 32.5 g/dL (ref 31.5–36.0)
MCV: 90.4 fL (ref 79.5–101.0)
MONO#: 0.5 10*3/uL (ref 0.1–0.9)
MONO%: 8.7 % (ref 0.0–14.0)
NEUT#: 2.6 10*3/uL (ref 1.5–6.5)
NEUT%: 47 % (ref 38.4–76.8)
PLATELETS: 328 10*3/uL (ref 145–400)
RBC: 4.82 10*6/uL (ref 3.70–5.45)
RDW: 16 % — ABNORMAL HIGH (ref 11.2–14.5)
WBC: 5.5 10*3/uL (ref 3.9–10.3)
lymph#: 2.1 10*3/uL (ref 0.9–3.3)

## 2014-02-27 LAB — COMPREHENSIVE METABOLIC PANEL (CC13)
ALK PHOS: 78 U/L (ref 40–150)
ALT: 7 U/L (ref 0–55)
AST: 13 U/L (ref 5–34)
Albumin: 3.4 g/dL — ABNORMAL LOW (ref 3.5–5.0)
Anion Gap: 11 mEq/L (ref 3–11)
BUN: 16.2 mg/dL (ref 7.0–26.0)
CO2: 27 mEq/L (ref 22–29)
CREATININE: 0.9 mg/dL (ref 0.6–1.1)
Calcium: 10 mg/dL (ref 8.4–10.4)
Chloride: 103 mEq/L (ref 98–109)
Glucose: 168 mg/dl — ABNORMAL HIGH (ref 70–140)
Potassium: 3.8 mEq/L (ref 3.5–5.1)
Sodium: 141 mEq/L (ref 136–145)
Total Bilirubin: 0.38 mg/dL (ref 0.20–1.20)
Total Protein: 7.1 g/dL (ref 6.4–8.3)

## 2014-02-27 MED ORDER — ANASTROZOLE 1 MG PO TABS: 1.0000 mg | ORAL_TABLET | Freq: Every day | ORAL | Status: DC

## 2014-02-27 NOTE — Progress Notes (Signed)
Hematology and Oncology Follow Up Visit  [name] [id number] 01-06-1946 68 y.o. 02/27/2014 9:27 AM [name], Karen Ham, MDDoolittle, Karen Ham, MD   Principle Diagnosis: 68 year old woman with multifocal left sided breast cancer diagnosed in June of 2015. 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 Ki- 67 is 9% with a recurrence score of 6 on Oncotype DX.   Prior Therapy: She is status post left mastectomy with sentinel lymph node biopsy on 12/20/2013. The final pathology showed invasive ductal carcinoma pathological staging T1cN0. This was slightly complicated with chest wall healing and flap necrosis.  Current therapy: She is under consideration the start of adjuvant hormone therapy.  Interim History:  [name] presents today for a followup visit. Since her last visit, her chest wall have continued to heal and her flap necrosis nearly have resolved. She did not report any draining her discomfort. She has regained most activities of daily living without any convincing decline. She has not had any headaches or blurry vision. Does not report any syncope or seizures. She has not had any fevers or chills or change in her activity level. He does not report any chest pain palpitation or lymphedema. She reports now any shortness of breath or cough. She is not report report any nausea or vomiting. She reports no abdominal pain and distention. She reports no constipation or diarrhea. She does not report any petechiae or lymphadenopathy. The rest of her review of systems is unremarkable.  Medications: I have reviewed the patient's current medications.  Current Outpatient Prescriptions  Medication Sig Dispense Refill  . albuterol (PROVENTIL HFA;VENTOLIN HFA) 108 (90 BASE) MCG/ACT inhaler Inhale 1-2 puffs into the lungs every 4 (four) hours as needed for wheezing or shortness of breath.      . ALPRAZolam (XANAX) 1 MG tablet Take 1 tablet (1 mg  total) by mouth 3 (three) times daily as needed for sleep or anxiety.  90 tablet  1  . amLODipine (NORVASC) 10 MG tablet Take 1 tablet (10 mg total) by mouth daily.  90 tablet  1  . atenolol (TENORMIN) 50 MG tablet Take 2 tablets (100 mg total) by mouth daily.  180 tablet  1  . Cholecalciferol (VITAMIN D PO) Take 1 capsule by mouth daily.      . hydrochlorothiazide (MICROZIDE) 12.5 MG capsule Take 1 capsule (12.5 mg total) by mouth daily.  90 capsule  1  . HYDROcodone-acetaminophen (NORCO/VICODIN) 5-325 MG per tablet Take 1-2 tablets by mouth every 6 (six) hours as needed for moderate pain.      . metFORMIN (GLUCOPHAGE XR) 500 MG 24 hr tablet Take 1 tablet (500 mg total) by mouth daily.  90 tablet  1  . oxyCODONE-acetaminophen (PERCOCET/ROXICET) 5-325 MG per tablet Take 1-2 tablets by mouth every 4 (four) hours as needed for severe pain.      Marland Kitchen sulfacetamide-prednisoLONE (VASOCIDIN) 10-0.23 % ophthalmic solution Place 1 drop into both eyes daily as needed (allergies).       Marland Kitchen anastrozole (ARIMIDEX) 1 MG tablet Take 1 tablet (1 mg total) by mouth daily.  90 tablet  3   No current facility-administered medications for this visit.     Allergies:  Allergies  Allergen Reactions  . Ace Inhibitors Other (See Comments)    Acute renal failure  . Angiotensin Receptor Blockers Other (See Comments)    Acute Renal Failure    Past Medical History, Surgical history, Social history, and Family  History were reviewed and updated.   Physical Exam: Blood pressure 122/66, pulse 66, temperature 97.8 F (36.6 C), temperature source Oral, resp. rate 17, height 5' 5"  (1.651 m), weight 195 lb 12.8 oz (88.814 kg), SpO2 96.00%. ECOG: 0 General appearance: alert and cooperative Head: Normocephalic, without obvious abnormality Neck: no adenopathy Lymph nodes: Cervical, supraclavicular, and axillary nodes normal. Heart:regular rate and rhythm, S1, S2 normal, no murmur, click, rub or gallop Lung:chest clear, no  wheezing, rales, normal symmetric air entry Chest wall examination revealed very small open wound on the left side. No masses or lesions noted. Abdomin: soft, non-tender, without masses or organomegaly EXT:no erythema, induration, or nodules   Lab Results: Lab Results  Component Value Date   WBC 5.5 02/27/2014   HGB 14.1 02/27/2014   HCT 43.5 02/27/2014   MCV 90.4 02/27/2014   PLT 328 02/27/2014     Chemistry      Component Value Date/Time   NA 141 02/27/2014 0819   NA 138 01/17/2014 0929   K 3.8 02/27/2014 0819   K 3.6* 01/17/2014 0929   CL 91* 01/17/2014 0929   CO2 27 02/27/2014 0819   CO2 30 01/17/2014 0929   BUN 16.2 02/27/2014 0819   BUN 11 01/17/2014 0929   CREATININE 0.9 02/27/2014 0819   CREATININE 0.91 01/17/2014 0929   CREATININE 0.95 08/22/2013 1259      Component Value Date/Time   CALCIUM 10.0 02/27/2014 0819   CALCIUM 10.1 01/17/2014 0929   ALKPHOS 78 02/27/2014 0819   ALKPHOS 75 08/22/2013 1259   AST 13 02/27/2014 0819   AST 56* 08/22/2013 1259   ALT 7 02/27/2014 0819   ALT 42* 08/22/2013 1259   BILITOT 0.38 02/27/2014 0819   BILITOT 0.7 08/22/2013 1259        Impression and Plan:  68 year old woman with the following issues:  1. 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 Ki-67 of 9%. She has a low recurrence score of 6 on her Oncotype DX. 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 She is status post left mastectomy in June of 2015 followed by debridements in July of 2015. The risks and benefits of adjuvant hormonal therapy were discussed and she is ready to proceed. A prescription for Arimidex was given to the patient and I will assess any complications in the next 2 months. She'll be on active surveillance after that for her breast cancer.  2. Flap necrosis: Seems to be healing very well at this time follows up with Dr. Marlou [name] regarding that issue.     [facility], MD 8/26/20159:27 AM

## 2014-02-27 NOTE — Telephone Encounter (Signed)
gv and printed appt sched and avs for pt for OCT. °

## 2014-03-06 ENCOUNTER — Telehealth: Payer: Self-pay

## 2014-03-06 NOTE — Telephone Encounter (Signed)
Pt states she need to have some 1 touch ultra strips called in for her, This is a new prescription so the pharmacy isn't able to call for a refill, please call pt at [phone]     [facility]

## 2014-03-07 MED ORDER — GLUCOSE BLOOD VI STRP: ORAL_STRIP | Status: DC

## 2014-03-07 NOTE — Telephone Encounter (Signed)
Sent in refill to new pharmacy Pt advised she needs appt. States she has an appt with Dr. Laney Pastor 04/03/14.

## 2014-03-15 ENCOUNTER — Encounter (INDEPENDENT_AMBULATORY_CARE_PROVIDER_SITE_OTHER): Payer: Medicare Other | Admitting: General Surgery

## 2014-04-03 ENCOUNTER — Encounter: Payer: Self-pay | Admitting: Internal Medicine

## 2014-04-03 ENCOUNTER — Ambulatory Visit (INDEPENDENT_AMBULATORY_CARE_PROVIDER_SITE_OTHER): Payer: Medicare Other | Admitting: Internal Medicine

## 2014-04-03 VITALS — BP 110/68 | HR 65 | Temp 98.1°F | Resp 16 | Ht 62.5 in | Wt 201.8 lb

## 2014-04-03 DIAGNOSIS — F341 Dysthymic disorder: Secondary | ICD-10-CM

## 2014-04-03 DIAGNOSIS — F418 Other specified anxiety disorders: Secondary | ICD-10-CM

## 2014-04-03 DIAGNOSIS — E2839 Other primary ovarian failure: Secondary | ICD-10-CM

## 2014-04-03 DIAGNOSIS — Z23 Encounter for immunization: Secondary | ICD-10-CM

## 2014-04-03 DIAGNOSIS — I10 Essential (primary) hypertension: Secondary | ICD-10-CM

## 2014-04-03 DIAGNOSIS — Z6836 Body mass index (BMI) 36.0-36.9, adult: Secondary | ICD-10-CM

## 2014-04-03 DIAGNOSIS — E119 Type 2 diabetes mellitus without complications: Secondary | ICD-10-CM

## 2014-04-03 LAB — POCT GLYCOSYLATED HEMOGLOBIN (HGB A1C): Hemoglobin A1C: 7.2

## 2014-04-03 MED ORDER — AMLODIPINE BESYLATE 10 MG PO TABS: 10.0000 mg | ORAL_TABLET | Freq: Every day | ORAL | Status: DC

## 2014-04-03 MED ORDER — ALPRAZOLAM 1 MG PO TABS: 1.0000 mg | ORAL_TABLET | Freq: Three times a day (TID) | ORAL | Status: DC | PRN

## 2014-04-03 MED ORDER — METFORMIN HCL ER 500 MG PO TB24: 500.0000 mg | ORAL_TABLET | Freq: Every day | ORAL | Status: DC

## 2014-04-03 MED ORDER — HYDROCHLOROTHIAZIDE 12.5 MG PO CAPS: 12.5000 mg | ORAL_CAPSULE | Freq: Every day | ORAL | Status: DC

## 2014-04-03 MED ORDER — ATENOLOL 50 MG PO TABS: 100.0000 mg | ORAL_TABLET | Freq: Every day | ORAL | Status: DC

## 2014-04-03 NOTE — Progress Notes (Addendum)
Subjective:  This chart was scribed for Karen Lin, MD by Donato Schultz, Medical Scribe. This patient was seen in Room 26 and the patient's care was started at 4:47 PM.   Patient ID: [name], female    DOB: March 10, 1946, 68 y.o.   MRN: [id number]  HPI HPI Comments: [name] is a 67 y.o. 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  She only takes one Metformin daily which was restarted in March after previous diet and wt loss control became less good. 2 weeks post hosp and not able to fully exercise.  She is also complaining of intermittent pain to her right knee on standing-occas w/ walking.  Walking will aggravate her pain intermittently. No swell or redness.   Stopped etoh when diagnosed with cancer-but has resumed occas.  She quit smoking 14 years ago.  Her 25 year-old granddaughter and 24 year-old great-granddaughter live with her.  Her grandson no longer lives with her-she kicked him out after he ret from prison and kept selling drugs.     Patient Active Problem List   Diagnosis Date Noted  . Skin flap necrosis 01/17/2014  . Breast cancer of upper-inner quadrant of left female breast 11/16/2013  . Abnormal LFTs--hep c neg 08/24/2013  . HNP (herniated nucleus pulposus), thoracic 07/17/2013  . History of tobacco use 05/24/2013  . Depression with anxiety 05/24/2013  . Vitamin D deficiency 05/24/2013  . Venous insufficiency 05/24/2013  . H/O bilateral hip replacements 01/16/2013  . BMI 36.0-36.9,adult 11/19/2011  . DM (diabetes mellitus) 11/19/2011  . HTN (hypertension) 11/19/2011  . Asthma 11/19/2011    Past Surgical History  Procedure Laterality Date  . Tubal ligation  1973  . Total hip arthroplasty Left   .  Total hip arthroplasty Right   . Orif shoulder fracture Left 1964  . Rotator cuff repair Right   . Ankle fracture surgery Left 03/15/2000    with hardware  . Joint replacement    . Mastectomy w/ sentinel node biopsy Left 12/20/2013    Procedure: MASTECTOMY WITH SENTINEL LYMPH NODE BIOPSY;  Surgeon: Merrie Roof, MD;  Location: [place];  Service: General;  Laterality: Left;  . Colonoscopy    . Application of wound vac  01/17/2014    w/chest wall debridement  . Mastectomy complete / simple w/ sentinel node biopsy Left   . Abdominal hysterectomy  1981    "fibroids"  . Application of wound vac Left 01/17/2014    Procedure: PLACEMENT OF VAC DRESSING;  Surgeon: Merrie Roof, MD;  Location: [place];  Service: General;  Laterality: Left;  . Wound debridement Left 01/17/2014    Procedure: DEBRIDEMENT OF CHEST WALL;  Surgeon: Merrie Roof, MD;  Location: [place];  Service: General;  Laterality: Left;   Allergies  Allergen Reactions  . Ace Inhibitors Other (See Comments)    Acute renal failure  . Angiotensin Receptor Blockers Other (See Comments)    Acute Renal Failure   Prior to Admission medications   Medication Sig Start Date End Date Taking? Authorizing Provider  albuterol (PROVENTIL HFA;VENTOLIN HFA) 108 (90 BASE) MCG/ACT inhaler Inhale 1-2 puffs into the lungs every 4 (four) hours as needed for wheezing or shortness of breath.  Historical Provider, MD  ALPRAZolam Duanne Moron) 1 MG tablet Take 1 tablet (1 mg total) by mouth 3 (three) times daily as needed for sleep or anxiety. 08/22/13   Leandrew Koyanagi, MD  amLODipine (NORVASC) 10 MG tablet Take 1 tablet (10 mg total) by mouth daily. 08/22/13   Leandrew Koyanagi, MD  anastrozole (ARIMIDEX) 1 MG tablet Take 1 tablet (1 mg total) by mouth daily. 02/27/14   Wyatt Portela, MD  atenolol (TENORMIN) 50 MG tablet Take 2 tablets (100 mg total) by mouth daily. 08/22/13   Leandrew Koyanagi, MD  Cholecalciferol (VITAMIN D PO) Take 1 capsule by mouth  daily.    Historical Provider, MD  glucose blood test strip Use as instructed 03/07/14   Mancel Bale, PA-C  hydrochlorothiazide (MICROZIDE) 12.5 MG capsule Take 1 capsule (12.5 mg total) by mouth daily. 08/22/13   Leandrew Koyanagi, MD  HYDROcodone-acetaminophen (NORCO/VICODIN) 5-325 MG per tablet Take 1-2 tablets by mouth every 6 (six) hours as needed for moderate pain.    Historical Provider, MD  metFORMIN (GLUCOPHAGE XR) 500 MG 24 hr tablet Take 1 tablet (500 mg total) by mouth daily. 08/22/13 08/22/14  Leandrew Koyanagi, MD  oxyCODONE-acetaminophen (PERCOCET/ROXICET) 5-325 MG per tablet Take 1-2 tablets by mouth every 4 (four) hours as needed for severe pain.    Historical Provider, MD  sulfacetamide-prednisoLONE (VASOCIDIN) 10-0.23 % ophthalmic solution Place 1 drop into both eyes daily as needed (allergies).  10/18/13   Historical Provider, MD   History   Social History  . Marital Status: Divorced    Spouse Name: n/a    Number of Children: 1  . Years of Education: 12   Occupational History  . retired     Network engineer   Social History Main Topics  . Smoking status: Former Smoker -- 0.50 packs/day for 40 years    Types: Cigarettes    Quit date: 11/11/2000  . Smokeless tobacco: Never Used  . Alcohol Use: Yes     Comment: 01/17/2014 "haven't drank anything since ~ 10/2013 when dx'd w/cancer"  . Drug Use: Yes    Special: Marijuana     Comment: "last smoked marijuana in ~ 2010; recreational user only"  . Sexual Activity: No   Other Topics Concern  . Not on file   Social History Narrative   Her daughter and granddaughter live with her.     Review of Systems  Musculoskeletal: Positive for arthralgias, gait problem and joint swelling.  no chest pain, palp, sob No gi/gu issues   Objective:  Physical Exam  Nursing note and vitals reviewed. Constitutional: She is oriented to person, place, and time. She appears well-developed and well-nourished. No distress.    HENT:  Head: Normocephalic and atraumatic.  Eyes: Conjunctivae and EOM are normal. Pupils are equal, round, and reactive to light.  Neck: Neck supple.  Cardiovascular: Normal rate, regular rhythm and normal heart sounds.   No murmur heard. Pulmonary/Chest: Effort normal and breath sounds normal.  Musculoskeletal:  L knee ex wnl today  Neurological: She is alert and oriented to person, place, and time. No cranial nerve deficit.  No sens losses extr  Psychiatric: She has a normal mood and affect. Her behavior is normal.   Results for orders placed in visit on 04/03/14  POCT GLYCOSYLATED HEMOGLOBIN (HGB A1C)      Result Value Ref Range   Hemoglobin A1C 7.2       BP 110/68  Pulse 65  Temp(Src)  98.1 F (36.7 C) (Oral)  Resp 16  Ht 5' 2.5" (1.588 m)  Wt 201 lb 12.8 oz (91.536 kg)  BMI 36.30 kg/m2  SpO2 94% Assessment & Plan:    I have completed the patient encounter in its entirety as documented by the scribe, with editing by me where necessary. Deysi Soldo P. Laney Pastor, M.D. Type II or unspecified type diabetes mellitus without mention of complication, not stated as uncontrolled - Plan: HM Diabetes Foot Exam, POCT glycosylated hemoglobin (Hb A1C), CANCELED: POCT glycosylated hemoglobin (Hb A1C)  Essential hypertension - Plan: amLODipine (NORVASC) 10 MG tablet, hydrochlorothiazide (MICROZIDE) 12.5 MG capsule  Estrogen deficiency - Plan: DG Bone Density  BMI 36.0-36.9,adult  Depression with anxiety  Meds ordered this encounter  Medications  . ALPRAZolam (XANAX) 1 MG tablet    Sig: Take 1 tablet (1 mg total) by mouth 3 (three) times daily as needed for sleep or anxiety.    Dispense:  90 tablet    Refill:  1  . atenolol (TENORMIN) 50 MG tablet    Sig: Take 2 tablets (100 mg total) by mouth daily.    Dispense:  180 tablet    Refill:  1  . metFORMIN (GLUCOPHAGE XR) 500 MG 24 hr tablet    Sig: Take 1 tablet (500 mg total) by mouth daily.    Dispense:  90 tablet    Refill:  1   . amLODipine (NORVASC) 10 MG tablet    Sig: Take 1 tablet (10 mg total) by mouth daily.    Dispense:  90 tablet    Refill:  1  . hydrochlorothiazide (MICROZIDE) 12.5 MG capsule    Sig: Take 1 capsule (12.5 mg total) by mouth daily.    Dispense:  90 capsule    Refill:  1   Cont same meds Focus on wt loss Labs in 5 mos 1st dexa

## 2014-04-24 ENCOUNTER — Telehealth: Payer: Self-pay | Admitting: Oncology

## 2014-04-24 ENCOUNTER — Other Ambulatory Visit (HOSPITAL_BASED_OUTPATIENT_CLINIC_OR_DEPARTMENT_OTHER): Payer: Medicare Other

## 2014-04-24 ENCOUNTER — Ambulatory Visit (HOSPITAL_BASED_OUTPATIENT_CLINIC_OR_DEPARTMENT_OTHER): Payer: Medicare Other | Admitting: Oncology

## 2014-04-24 VITALS — BP 137/67 | HR 68 | Temp 98.3°F | Resp 20 | Wt 199.2 lb

## 2014-04-24 DIAGNOSIS — R0789 Other chest pain: Secondary | ICD-10-CM

## 2014-04-24 DIAGNOSIS — C50212 Malignant neoplasm of upper-inner quadrant of left female breast: Secondary | ICD-10-CM

## 2014-04-24 DIAGNOSIS — C50812 Malignant neoplasm of overlapping sites of left female breast: Secondary | ICD-10-CM

## 2014-04-24 DIAGNOSIS — Z170 Estrogen receptor positive status [ER+]: Secondary | ICD-10-CM

## 2014-04-24 LAB — CBC WITH DIFFERENTIAL/PLATELET
BASO%: 1.4 % (ref 0.0–2.0)
BASOS ABS: 0 10*3/uL (ref 0.0–0.1)
EOS%: 4.8 % (ref 0.0–7.0)
Eosinophils Absolute: 0.2 10*3/uL (ref 0.0–0.5)
HCT: 41.9 % (ref 34.8–46.6)
HGB: 13.7 g/dL (ref 11.6–15.9)
LYMPH%: 42 % (ref 14.0–49.7)
MCH: 30.1 pg (ref 25.1–34.0)
MCHC: 32.7 g/dL (ref 31.5–36.0)
MCV: 92.2 fL (ref 79.5–101.0)
MONO#: 0.4 10*3/uL (ref 0.1–0.9)
MONO%: 11.3 % (ref 0.0–14.0)
NEUT%: 40.5 % (ref 38.4–76.8)
NEUTROS ABS: 1.3 10*3/uL — AB (ref 1.5–6.5)
PLATELETS: 311 10*3/uL (ref 145–400)
RBC: 4.55 10*6/uL (ref 3.70–5.45)
RDW: 16.9 % — ABNORMAL HIGH (ref 11.2–14.5)
WBC: 3.3 10*3/uL — ABNORMAL LOW (ref 3.9–10.3)
lymph#: 1.4 10*3/uL (ref 0.9–3.3)

## 2014-04-24 LAB — COMPREHENSIVE METABOLIC PANEL (CC13)
ALK PHOS: 80 U/L (ref 40–150)
ALT: 20 U/L (ref 0–55)
AST: 18 U/L (ref 5–34)
Albumin: 3.3 g/dL — ABNORMAL LOW (ref 3.5–5.0)
Anion Gap: 9 mEq/L (ref 3–11)
BILIRUBIN TOTAL: 0.49 mg/dL (ref 0.20–1.20)
BUN: 12 mg/dL (ref 7.0–26.0)
CO2: 30 mEq/L — ABNORMAL HIGH (ref 22–29)
Calcium: 10 mg/dL (ref 8.4–10.4)
Chloride: 103 mEq/L (ref 98–109)
Creatinine: 0.9 mg/dL (ref 0.6–1.1)
Glucose: 176 mg/dl — ABNORMAL HIGH (ref 70–140)
Potassium: 3.9 mEq/L (ref 3.5–5.1)
SODIUM: 141 meq/L (ref 136–145)
Total Protein: 6.7 g/dL (ref 6.4–8.3)

## 2014-04-24 MED ORDER — OXYCODONE HCL 5 MG PO TABS: 5.0000 mg | ORAL_TABLET | ORAL | Status: DC | PRN

## 2014-04-24 NOTE — Progress Notes (Signed)
Hematology and Oncology Follow Up Visit  [name] [id number] 06/15/1946 68 y.o. 04/24/2014 9:02 AM DOOLITTLE, Linton Ham, MDDoolittle, Linton Ham, MD   Principle Diagnosis: 68 year old woman with multifocal left sided breast cancer diagnosed in June of 2015. 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 Ki- 67 is 9% with a recurrence score of 6 on Oncotype DX.   Prior Therapy: She is status post left mastectomy with sentinel lymph node biopsy on 12/20/2013. The final pathology showed invasive ductal carcinoma pathological staging T1cN0. Complicated with chest wall healing and flap necrosis.  Current therapy: Arimidex 1 mg daily started in August of 2015.  Interim History:  Ms. Panchal presents today for a followup visit. Since her last visit, she started hormone therapy without any complications. She did not report any toxicity associated with this medication. She did not report any arthralgias, myalgias or any GI toxicity. She did not report any draining around her chest wall. She still reports some chest wall tenderness and requires oxycodone infrequently. She has regained all activities of daily living without any decline. She has not had any headaches or blurry vision. Does not report any syncope or seizures. She has not had any fevers or chills or change in her activity level. He does not report any palpitation or lymphedema. She reports now any shortness of breath or cough. She is not report report any nausea or vomiting. She reports no abdominal pain and distention. She reports no constipation or diarrhea. She does not report any petechiae or lymphadenopathy. The rest of her review of systems is unremarkable.  Medications: I have reviewed the patient's current medications.  Current Outpatient Prescriptions  Medication Sig Dispense Refill  . albuterol (PROVENTIL HFA;VENTOLIN HFA) 108 (90 BASE) MCG/ACT inhaler Inhale 1-2 puffs into the  lungs every 4 (four) hours as needed for wheezing or shortness of breath.      . ALPRAZolam (XANAX) 1 MG tablet Take 1 tablet (1 mg total) by mouth 3 (three) times daily as needed for sleep or anxiety.  90 tablet  1  . amLODipine (NORVASC) 10 MG tablet Take 1 tablet (10 mg total) by mouth daily.  90 tablet  1  . anastrozole (ARIMIDEX) 1 MG tablet Take 1 tablet (1 mg total) by mouth daily.  90 tablet  3  . atenolol (TENORMIN) 50 MG tablet Take 2 tablets (100 mg total) by mouth daily.  180 tablet  1  . Cholecalciferol (VITAMIN D PO) Take 1 capsule by mouth daily.      Marland Kitchen glucose blood test strip Use as instructed  100 each  12  . hydrochlorothiazide (MICROZIDE) 12.5 MG capsule Take 1 capsule (12.5 mg total) by mouth daily.  90 capsule  1  . metFORMIN (GLUCOPHAGE XR) 500 MG 24 hr tablet Take 1 tablet (500 mg total) by mouth daily.  90 tablet  1  . sulfacetamide-prednisoLONE (VASOCIDIN) 10-0.23 % ophthalmic solution Place 1 drop into both eyes daily as needed (allergies).       Marland Kitchen oxyCODONE (ROXICODONE) 5 MG immediate release tablet Take 1 tablet (5 mg total) by mouth every 4 (four) hours as needed for severe pain.  30 tablet  0   No current facility-administered medications for this visit.     Allergies:  Allergies  Allergen Reactions  . Ace Inhibitors Other (See Comments)    Acute renal failure  . Angiotensin Receptor Blockers Other (See Comments)    Acute Renal  Failure    Past Medical History, Surgical history, Social history, and Family History were reviewed and updated.   Physical Exam: Blood pressure 137/67, pulse 68, temperature 98.3 F (36.8 C), temperature source Oral, resp. rate 20, weight 199 lb 4 oz (90.379 kg). ECOG: 0 General appearance: alert and cooperative Head: Normocephalic, without obvious abnormality Neck: no adenopathy Lymph nodes: Cervical, supraclavicular, and axillary nodes normal. Heart:regular rate and rhythm, S1, S2 normal, no murmur, click, rub or  gallop Lung:chest clear, no wheezing, rales, normal symmetric air entry Chest wall examination revealed well-healed scar without any masses or lesions. Left mastectomy site appeared normal. Abdomin: soft, non-tender, without masses or organomegaly EXT:no erythema, induration, or nodules   Lab Results: Lab Results  Component Value Date   WBC 3.3* 04/24/2014   HGB 13.7 04/24/2014   HCT 41.9 04/24/2014   MCV 92.2 04/24/2014   PLT 311 04/24/2014     Chemistry      Component Value Date/Time   NA 141 02/27/2014 0819   NA 138 01/17/2014 0929   K 3.8 02/27/2014 0819   K 3.6* 01/17/2014 0929   CL 91* 01/17/2014 0929   CO2 27 02/27/2014 0819   CO2 30 01/17/2014 0929   BUN 16.2 02/27/2014 0819   BUN 11 01/17/2014 0929   CREATININE 0.9 02/27/2014 0819   CREATININE 0.91 01/17/2014 0929   CREATININE 0.95 08/22/2013 1259      Component Value Date/Time   CALCIUM 10.0 02/27/2014 0819   CALCIUM 10.1 01/17/2014 0929   ALKPHOS 78 02/27/2014 0819   ALKPHOS 75 08/22/2013 1259   AST 13 02/27/2014 0819   AST 56* 08/22/2013 1259   ALT 7 02/27/2014 0819   ALT 42* 08/22/2013 1259   BILITOT 0.38 02/27/2014 0819   BILITOT 0.7 08/22/2013 1259        Impression and Plan:  68 year old woman with the following issues:  1. 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 Ki-67 of 9%. She has a low recurrence score of 6 on her Oncotype DX. 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 She is status post left mastectomy in June of 2015 followed by debridements in July of 2015. She is currently on Arimidex and tolerated it well for the last 2 months. The plan is to continue to come medication for at least 5 years.  2. Flap necrosis: Seems to be have healed well at this time.  3. Chest wall pain: This is improving dramatically. He given a prescription for oxycodone for severe pain only.  4. Osteoporosis prophylaxis: Will lead to continue monitor  this on aromatase inhibitors.  5. Followup: Will be in 3 months.     [facility], MD 10/21/20159:02 AM

## 2014-04-24 NOTE — Telephone Encounter (Signed)
gv adn printed appt sched and avs for pt fro Jan 2016

## 2014-07-17 ENCOUNTER — Other Ambulatory Visit: Payer: Medicare Other

## 2014-07-17 ENCOUNTER — Ambulatory Visit: Payer: Medicare Other | Admitting: Oncology

## 2014-07-26 ENCOUNTER — Telehealth: Payer: Self-pay | Admitting: Oncology

## 2014-07-26 NOTE — Telephone Encounter (Signed)
Pt lft msg to r/s her last labs/ov, she was not able to make it. I lft a msg for pt confirming labs/ov for her next visit and mailed out ltr/sch... KJ

## 2014-08-13 ENCOUNTER — Other Ambulatory Visit: Payer: Self-pay | Admitting: Internal Medicine

## 2014-09-03 ENCOUNTER — Ambulatory Visit (HOSPITAL_BASED_OUTPATIENT_CLINIC_OR_DEPARTMENT_OTHER): Payer: Medicare Other | Admitting: Oncology

## 2014-09-03 ENCOUNTER — Other Ambulatory Visit (HOSPITAL_BASED_OUTPATIENT_CLINIC_OR_DEPARTMENT_OTHER): Payer: Medicare Other

## 2014-09-03 ENCOUNTER — Telehealth: Payer: Self-pay | Admitting: Oncology

## 2014-09-03 VITALS — BP 104/55 | HR 74 | Temp 98.0°F | Resp 19 | Ht 62.0 in | Wt 198.5 lb

## 2014-09-03 DIAGNOSIS — C50112 Malignant neoplasm of central portion of left female breast: Secondary | ICD-10-CM

## 2014-09-03 DIAGNOSIS — C50212 Malignant neoplasm of upper-inner quadrant of left female breast: Secondary | ICD-10-CM

## 2014-09-03 DIAGNOSIS — R0789 Other chest pain: Secondary | ICD-10-CM

## 2014-09-03 LAB — CBC WITH DIFFERENTIAL/PLATELET
BASO%: 1.5 % (ref 0.0–2.0)
Basophils Absolute: 0 10*3/uL (ref 0.0–0.1)
EOS%: 3.6 % (ref 0.0–7.0)
Eosinophils Absolute: 0.1 10*3/uL (ref 0.0–0.5)
HCT: 39.8 % (ref 34.8–46.6)
HGB: 12.9 g/dL (ref 11.6–15.9)
LYMPH%: 55.4 % — AB (ref 14.0–49.7)
MCH: 31.6 pg (ref 25.1–34.0)
MCHC: 32.5 g/dL (ref 31.5–36.0)
MCV: 97.2 fL (ref 79.5–101.0)
MONO#: 0.3 10*3/uL (ref 0.1–0.9)
MONO%: 9.8 % (ref 0.0–14.0)
NEUT#: 0.9 10*3/uL — ABNORMAL LOW (ref 1.5–6.5)
NEUT%: 29.7 % — ABNORMAL LOW (ref 38.4–76.8)
Platelets: 288 10*3/uL (ref 145–400)
RBC: 4.1 10*6/uL (ref 3.70–5.45)
RDW: 19.7 % — ABNORMAL HIGH (ref 11.2–14.5)
WBC: 3.2 10*3/uL — AB (ref 3.9–10.3)
lymph#: 1.8 10*3/uL (ref 0.9–3.3)

## 2014-09-03 LAB — COMPREHENSIVE METABOLIC PANEL (CC13)
ALBUMIN: 3.2 g/dL — AB (ref 3.5–5.0)
ALT: 18 U/L (ref 0–55)
ANION GAP: 17 meq/L — AB (ref 3–11)
AST: 29 U/L (ref 5–34)
Alkaline Phosphatase: 70 U/L (ref 40–150)
BUN: 16.2 mg/dL (ref 7.0–26.0)
CALCIUM: 9 mg/dL (ref 8.4–10.4)
CO2: 30 mEq/L — ABNORMAL HIGH (ref 22–29)
CREATININE: 0.8 mg/dL (ref 0.6–1.1)
Chloride: 96 mEq/L — ABNORMAL LOW (ref 98–109)
EGFR: 85 mL/min/{1.73_m2} — AB (ref >90)
GLUCOSE: 175 mg/dL — AB (ref 70–140)
Potassium: 3.3 mEq/L — ABNORMAL LOW (ref 3.5–5.1)
SODIUM: 143 meq/L (ref 136–145)
TOTAL PROTEIN: 6.1 g/dL — AB (ref 6.4–8.3)
Total Bilirubin: 0.61 mg/dL (ref 0.20–1.20)

## 2014-09-03 NOTE — Telephone Encounter (Signed)
gv and printed appt schedand avs for pt for Sept °

## 2014-09-03 NOTE — Progress Notes (Signed)
Hematology and Oncology Follow Up Visit  [name] [id number] 1945/10/21 69 y.o. 09/03/2014 9:07 AM DOOLITTLE, [name], MDDoolittle, Karen Ham, MD   Principle Diagnosis: 69 year old woman with multifocal left sided breast cancer diagnosed in June of 2015. 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 Ki- 67 is 9% with a recurrence score of 6 on Oncotype DX.   Prior Therapy: She is status post left mastectomy with sentinel lymph node biopsy on 12/20/2013. The final pathology showed invasive ductal carcinoma pathological staging T1cN0. Complicated with chest wall healing and flap necrosis.  Current therapy: Arimidex 1 mg daily started in August of 2015.  Interim History:  [name] presents today for a followup visit. Since her last visit, she does not report any new issues. She is currently on Arimidex without any new complications. She did not report any arthralgias, myalgias or any GI toxicity. She did not report any draining around her chest wall. She does report intermittent knee pain that is arthritic in nature. She has regained all activities of daily living without any decline. She has not had any headaches or blurry vision. Does not report any syncope or seizures. She has not had any fevers or chills or change in her activity level. He does not report any palpitation or lymphedema. She reports now any shortness of breath or cough. She is not report report any nausea or vomiting. She reports no abdominal pain and distention. She reports no constipation or diarrhea. She does not report any petechiae or lymphadenopathy. The rest of her review of systems is unremarkable.  Medications: I have reviewed the patient's current medications.  Current Outpatient Prescriptions  Medication Sig Dispense Refill  . albuterol (PROVENTIL HFA;VENTOLIN HFA) 108 (90 BASE) MCG/ACT inhaler Inhale 1-2 puffs into the lungs every 4 (four) hours as needed for  wheezing or shortness of breath.    . ALPRAZolam (XANAX) 1 MG tablet Take 1 tablet (1 mg total) by mouth 3 (three) times daily as needed for sleep or anxiety. 90 tablet 1  . amLODipine (NORVASC) 10 MG tablet Take 1 tablet (10 mg total) by mouth daily. 90 tablet 1  . anastrozole (ARIMIDEX) 1 MG tablet Take 1 tablet (1 mg total) by mouth daily. 90 tablet 3  . atenolol (TENORMIN) 50 MG tablet Take 2 tablets (100 mg total) by mouth daily. 180 tablet 1  . Cholecalciferol (VITAMIN D PO) Take 1 capsule by mouth daily.    Marland Kitchen glucose blood test strip Use as instructed 100 each 12  . hydrochlorothiazide (MICROZIDE) 12.5 MG capsule Take 1 capsule (12.5 mg total) by mouth daily. 90 capsule 1  . metFORMIN (GLUCOPHAGE XR) 500 MG 24 hr tablet Take 1 tablet (500 mg total) by mouth daily. 90 tablet 1  . metFORMIN (GLUCOPHAGE-XR) 500 MG 24 hr tablet Take 1 tablet (500 mg total) by mouth daily. PATIENT NEEDS OFFICE VISIT FOR ADDITIONAL REFILLS 30 tablet 0  . oxyCODONE (ROXICODONE) 5 MG immediate release tablet Take 1 tablet (5 mg total) by mouth every 4 (four) hours as needed for severe pain. 30 tablet 0  . sulfacetamide-prednisoLONE (VASOCIDIN) 10-0.23 % ophthalmic solution Place 1 drop into both eyes daily as needed (allergies).      No current facility-administered medications for this visit.     Allergies:  Allergies  Allergen Reactions  . Ace Inhibitors Other (See Comments)    Acute renal failure  . Angiotensin Receptor Blockers Other (See Comments)  Acute Renal Failure    Past Medical History, Surgical history, Social history, and Family History were reviewed and updated.   Physical Exam: Blood pressure 104/55, pulse 74, temperature 98 F (36.7 C), temperature source Oral, resp. rate 19, height 5' 2"  (1.575 m), weight 198 lb 8 oz (90.039 kg), SpO2 96 %. ECOG: 0 General appearance: alert and cooperative Head: Normocephalic, without obvious abnormality Neck: no adenopathy Lymph nodes: Cervical,  supraclavicular, and axillary nodes normal. Heart:regular rate and rhythm, S1, S2 normal, no murmur, click, rub or gallop Lung:chest clear, no wheezing, rales, normal symmetric air entry Chest wall examination revealed well-healed scar without any masses or lesions. Left mastectomy site appeared normal. Abdomin: soft, non-tender, without masses or organomegaly EXT:no erythema, induration, or nodules   Lab Results: Lab Results  Component Value Date   WBC 3.2* 09/03/2014   HGB 12.9 09/03/2014   HCT 39.8 09/03/2014   MCV 97.2 09/03/2014   PLT 288 09/03/2014     Chemistry      Component Value Date/Time   NA 141 04/24/2014 0835   NA 138 01/17/2014 0929   K 3.9 04/24/2014 0835   K 3.6* 01/17/2014 0929   CL 91* 01/17/2014 0929   CO2 30* 04/24/2014 0835   CO2 30 01/17/2014 0929   BUN 12.0 04/24/2014 0835   BUN 11 01/17/2014 0929   CREATININE 0.9 04/24/2014 0835   CREATININE 0.91 01/17/2014 0929   CREATININE 0.95 08/22/2013 1259      Component Value Date/Time   CALCIUM 10.0 04/24/2014 0835   CALCIUM 10.1 01/17/2014 0929   ALKPHOS 80 04/24/2014 0835   ALKPHOS 75 08/22/2013 1259   AST 18 04/24/2014 0835   AST 56* 08/22/2013 1259   ALT 20 04/24/2014 0835   ALT 42* 08/22/2013 1259   BILITOT 0.49 04/24/2014 0835   BILITOT 0.7 08/22/2013 1259        Impression and Plan:  69 year old woman with the following issues:  1. 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 Ki-67 of 9%. She has a low recurrence score of 6 on her Oncotype DX. 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 She is status post left mastectomy in June of 2015 followed by debridements in July of 2015. She is currently on Arimidex and tolerated it well. The plan is to continue to come medication for at least 5 years. She will have repeat mammography at some point this summer.  2. Flap necrosis: Seems to be have healed well at this  time.  3. Chest wall pain: This is improving dramatically.   4. Osteoporosis prophylaxis: Will lead to continue monitor this on aromatase inhibitors.  5. Followup: Will be in 6 months.     [facility], MD 3/1/20169:07 AM

## 2014-09-12 ENCOUNTER — Other Ambulatory Visit: Payer: Self-pay | Admitting: Oncology

## 2014-09-12 ENCOUNTER — Other Ambulatory Visit: Payer: Self-pay

## 2014-09-12 DIAGNOSIS — Z853 Personal history of malignant neoplasm of breast: Secondary | ICD-10-CM

## 2014-09-12 DIAGNOSIS — Z9889 Other specified postprocedural states: Secondary | ICD-10-CM

## 2014-09-24 ENCOUNTER — Encounter: Payer: Self-pay | Admitting: *Deleted

## 2014-09-26 ENCOUNTER — Other Ambulatory Visit: Payer: Self-pay | Admitting: Internal Medicine

## 2014-10-07 ENCOUNTER — Other Ambulatory Visit: Payer: Self-pay | Admitting: Internal Medicine

## 2014-10-21 ENCOUNTER — Ambulatory Visit
Admission: RE | Admit: 2014-10-21 | Discharge: 2014-10-21 | Disposition: A | Discharge status: Home / Self Care | Payer: Medicare Other | Source: Ambulatory Visit

## 2014-10-21 DIAGNOSIS — Z853 Personal history of malignant neoplasm of breast: Secondary | ICD-10-CM

## 2014-10-21 DIAGNOSIS — Z9889 Other specified postprocedural states: Secondary | ICD-10-CM

## 2014-10-23 ENCOUNTER — Encounter: Payer: Self-pay | Admitting: Internal Medicine

## 2014-10-23 ENCOUNTER — Ambulatory Visit (INDEPENDENT_AMBULATORY_CARE_PROVIDER_SITE_OTHER): Payer: Medicare Other | Admitting: Internal Medicine

## 2014-10-23 VITALS — BP 113/76 | HR 68 | Temp 97.7°F | Resp 16 | Ht 62.5 in | Wt 196.6 lb

## 2014-10-23 DIAGNOSIS — E119 Type 2 diabetes mellitus without complications: Secondary | ICD-10-CM | POA: Diagnosis not present

## 2014-10-23 DIAGNOSIS — F418 Other specified anxiety disorders: Secondary | ICD-10-CM

## 2014-10-23 DIAGNOSIS — I10 Essential (primary) hypertension: Secondary | ICD-10-CM | POA: Diagnosis not present

## 2014-10-23 DIAGNOSIS — Z6836 Body mass index (BMI) 36.0-36.9, adult: Secondary | ICD-10-CM | POA: Diagnosis not present

## 2014-10-23 LAB — LIPID PANEL
CHOLESTEROL: 171 mg/dL (ref 0–200)
HDL: 57 mg/dL (ref >=46)
LDL Cholesterol: 76 mg/dL (ref 0–99)
Total CHOL/HDL Ratio: 3 Ratio
Triglycerides: 188 mg/dL — ABNORMAL HIGH (ref <150)
VLDL: 38 mg/dL (ref 0–40)

## 2014-10-23 LAB — POCT GLYCOSYLATED HEMOGLOBIN (HGB A1C): Hemoglobin A1C: 6.4

## 2014-10-23 MED ORDER — ALPRAZOLAM 1 MG PO TABS: 1.0000 mg | ORAL_TABLET | Freq: Three times a day (TID) | ORAL | Status: DC | PRN

## 2014-10-23 MED ORDER — ATENOLOL 50 MG PO TABS: 100.0000 mg | ORAL_TABLET | Freq: Every day | ORAL | Status: DC

## 2014-10-23 MED ORDER — AMLODIPINE BESYLATE 10 MG PO TABS: 10.0000 mg | ORAL_TABLET | Freq: Every day | ORAL | Status: DC

## 2014-10-23 MED ORDER — HYDROCHLOROTHIAZIDE 12.5 MG PO CAPS: 12.5000 mg | ORAL_CAPSULE | Freq: Every day | ORAL | Status: DC

## 2014-10-23 NOTE — Progress Notes (Signed)
Subjective:    Patient ID: [name], female    DOB: 28-Mar-1946, 69 y.o.   MRN: [id number]  HPI Patient Active Problem List   Diagnosis Date Noted  . Skin flap necrosis---well  Well-healed 01/17/2014  . Breast cancer of upper-inner quadrant of left female breast----stable on arimidex 11/16/2013  . Abnormal LFTs--hep c neg 08/24/2013  . HNP (herniated nucleus pulposus), thoracic---- stable 07/17/2013  . History of tobacco use---- quit 2004 05/24/2013  . Depression with anxiety----- made worse by recent family events where nephew had to have amputation of leg secondary to unusual blood clot 05/24/2013  . Vitamin D deficiency--- past history 05/24/2013  . Venous insufficiency 05/24/2013  . H/O bilateral hip replacements---- good results and very active now 01/16/2013  . BMI 36.0-36.9,adult---- unable to lose Wt Readings from Last 3 Encounters:  10/23/14 196 lb 9.6 oz (89.177 kg)  09/03/14 198 lb 8 oz (90.039 kg)  04/24/14 199 lb 4 oz (90.379 kg)    11/19/2011  . DM (diabetes mellitus)---- feels she is doing well with no nocturia and no pain or loss of sensation in her feet 11/19/2011  . HTN (hypertension)----- occasional home readings usually normal 11/19/2011  . Asthma---- inactive 11/19/2011   Prior to Admission medications   Medication Sig Start Date End Date Taking? Authorizing Provider  albuterol (PROVENTIL HFA;VENTOLIN HFA) 108 (90 BASE) MCG/ACT inhaler Inhale 1-2 puffs into the lungs every 4 (four) hours as needed for wheezing or shortness of breath.   Yes Historical Provider, MD  ALPRAZolam Duanne Moron) 1 MG tablet Take 1 tablet (1 mg total) by mouth 3 (three) times daily as needed for sleep or anxiety. 04/03/14  Yes Leandrew Koyanagi, MD  amLODipine (NORVASC) 10 MG tablet Take 1 tablet (10 mg total) by mouth daily. PATIENT NEEDS OFFICE VISIT FOR ADDITIONAL REFILLS 10/08/14  Yes Leandrew Koyanagi, MD  anastrozole (ARIMIDEX) 1 MG tablet Take 1 tablet (1 mg total) by mouth daily.  02/27/14  Yes Wyatt Portela, MD  atenolol (TENORMIN) 50 MG tablet Take 2 tablets (100 mg total) by mouth daily. 04/03/14  Yes Leandrew Koyanagi, MD  Cholecalciferol (VITAMIN D PO) Take 1 capsule by mouth daily.   Yes Historical Provider, MD  glucose blood test strip Use as instructed 03/07/14  Yes Mancel Bale, PA-C  hydrochlorothiazide (MICROZIDE) 12.5 MG capsule Take 1 capsule (12.5 mg total) by mouth daily. 04/03/14  Yes Leandrew Koyanagi, MD  metFORMIN (GLUCOPHAGE XR) 500 MG 24 hr tablet Take 1 tablet (500 mg total) by mouth daily. 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  HENT: Negative for trouble swallowing.   Eyes: Negative for photophobia and visual disturbance.  Respiratory: Negative for chest tightness and shortness of breath.   Cardiovascular: Negative for chest pain, palpitations and leg swelling.  Gastrointestinal: Negative for abdominal pain.  Genitourinary: Negative for difficulty urinating.  Musculoskeletal: Negative for myalgias and back pain.  Neurological: Negative for weakness, light-headedness and headaches.  Hematological: Does not bruise/bleed easily.  Psychiatric/Behavioral: Negative for sleep disturbance and decreased concentration.       Objective:   Physical Exam  Constitutional: She is oriented to person, place, and time. She appears well-nourished. No distress.  Continues overweight  Eyes:  EOM are normal. Pupils are equal, round, and reactive to light.  Neck: No thyromegaly present.  Cardiovascular: Normal rate, regular rhythm, normal heart sounds and intact distal pulses.   No murmur heard. Pulmonary/Chest: Effort normal and breath sounds normal.  Musculoskeletal: She  exhibits no edema.  Lymphadenopathy:    She has no cervical adenopathy.  Neurological: She is alert and oriented to person, place, and time. No cranial nerve deficit.  Psychiatric: She has a normal mood and affect. Her behavior is normal. Judgment and thought content normal.   BP 113/76 mmHg  Pulse 68  Temp(Src) 97.7 F (36.5 C) (Oral)  Resp 16  Ht 5' 2.5" (1.588 m)  Wt 196 lb 9.6 oz (89.177 kg)  BMI 35.36 kg/m2  SpO2 95%  Lab Results  Component Value Date   HGBA1C 6.4 10/23/2014       Assessment & Plan:  Type 2 diabetes mellitus without complication - Plan: Lipid panel  No chg meds  BMI 36.0-36.9,adult  Needs wt loss  Essential hypertension - Plan: hydrochlorothiazide (MICROZIDE) 12.5 MG capsule  Depression with anxiety  Cont meds  Meds ordered this encounter  Medications  . hydrochlorothiazide (MICROZIDE) 12.5 MG capsule    Sig: Take 1 capsule (12.5 mg total) by mouth daily.    Dispense:  90 capsule    Refill:  3  . amLODipine (NORVASC) 10 MG tablet    Sig: Take 1 tablet (10 mg total) by mouth daily.    Dispense:  90 tablet    Refill:  3  . ALPRAZolam (XANAX) 1 MG tablet    Sig: Take 1 tablet (1 mg total) by mouth 3 (three) times daily as needed for sleep or anxiety.    Dispense:  90 tablet    Refill:  5  . atenolol (TENORMIN) 50 MG tablet    Sig: Take 2 tablets (100 mg total) by mouth daily.    Dispense:  180 tablet    Refill:  3   immun at f/u Z and P

## 2014-10-24 ENCOUNTER — Other Ambulatory Visit: Payer: Self-pay | Admitting: Internal Medicine

## 2014-10-24 NOTE — Telephone Encounter (Signed)
Dr Laney Pastor, OV notes weren't finished for yesterday's visit, so wanted to make sure there is no change in metformin dose. Do you want to give her a yrs' RFs like the other chronic meds you Rxd at [facility]? Pended.

## 2014-10-28 ENCOUNTER — Encounter: Payer: Self-pay | Admitting: Internal Medicine

## 2014-11-23 ENCOUNTER — Ambulatory Visit (INDEPENDENT_AMBULATORY_CARE_PROVIDER_SITE_OTHER): Payer: Medicare Other | Admitting: Physician Assistant

## 2014-11-23 ENCOUNTER — Ambulatory Visit (INDEPENDENT_AMBULATORY_CARE_PROVIDER_SITE_OTHER): Payer: Medicare Other

## 2014-11-23 VITALS — BP 114/58 | HR 82 | Temp 97.9°F | Resp 18 | Ht 63.0 in | Wt 196.0 lb

## 2014-11-23 DIAGNOSIS — M79672 Pain in left foot: Secondary | ICD-10-CM

## 2014-11-23 DIAGNOSIS — M25475 Effusion, left foot: Secondary | ICD-10-CM | POA: Diagnosis not present

## 2014-11-23 LAB — POCT CBC
Granulocyte percent: 74 %G (ref 37–80)
HCT, POC: 38.7 % (ref 37.7–47.9)
Hemoglobin: 12.9 g/dL (ref 12.2–16.2)
LYMPH, POC: 1.3 (ref 0.6–3.4)
MCH, POC: 30.5 pg (ref 27–31.2)
MCHC: 33.4 g/dL (ref 31.8–35.4)
MCV: 91.3 fL (ref 80–97)
MID (cbc): 0.7 (ref 0–0.9)
MPV: 6.2 fL (ref 0–99.8)
PLATELET COUNT, POC: 276 10*3/uL (ref 142–424)
POC Granulocyte: 5.8 (ref 2–6.9)
POC LYMPH PERCENT: 17.3 %L (ref 10–50)
POC MID %: 8.7 %M (ref 0–12)
RBC: 4.24 M/uL (ref 4.04–5.48)
RDW, POC: 17.4 %
WBC: 7.8 10*3/uL (ref 4.6–10.2)

## 2014-11-23 LAB — URIC ACID: Uric Acid, Serum: 9.1 mg/dL — ABNORMAL HIGH (ref 2.4–7.0)

## 2014-11-23 MED ORDER — INDOMETHACIN 50 MG PO CAPS: 50.0000 mg | ORAL_CAPSULE | Freq: Three times a day (TID) | ORAL | Status: DC | PRN

## 2014-11-23 NOTE — Patient Instructions (Signed)

## 2014-11-23 NOTE — Progress Notes (Signed)
Subjective:    Patient ID: [name], female    DOB: [birth year], 69 y.o.   MRN: [id number]  HPI Pt presents to clinic with left foot pain that she has had for 2 days - it seems to be getting worse.  She was walking a lot the day it started but she does not remember an injury of any sort to her foot.  She is otherwise feeling fine without F/C.  She has had something similar to this in her right foot in the past but it has never hurt this bad and it has gone away in about 3-4 days by itself and she just uses a cane to help her walk.  This time she is using a crutch because the pain is much worse esp with weight bearing.  She has used no ice or heat.  Home treatment - left over percocet - not sure they really helped  Review of Systems  Constitutional: Negative for fever and chills.  Musculoskeletal: Positive for gait problem (hurts to walk - using crutches).    Patient Active Problem List   Diagnosis Date Noted  . Skin flap necrosis 01/17/2014  . Breast cancer of upper-inner quadrant of left female breast 11/16/2013  . Abnormal LFTs--hep c neg 08/24/2013  . HNP (herniated nucleus pulposus), thoracic 07/17/2013  . History of tobacco use 05/24/2013  . Depression with anxiety 05/24/2013  . Vitamin D deficiency 05/24/2013  . Venous insufficiency 05/24/2013  . H/O bilateral hip replacements 01/16/2013  . BMI 36.0-36.9,adult 11/19/2011  . DM (diabetes mellitus) 11/19/2011  . HTN (hypertension) 11/19/2011  . Asthma 11/19/2011   Prior to Admission medications   Medication Sig Start Date End Date Taking? Authorizing Provider  albuterol (PROVENTIL HFA;VENTOLIN HFA) 108 (90 BASE) MCG/ACT inhaler Inhale 1-2 puffs into the lungs every 4 (four) hours as needed for wheezing or shortness of breath.   Yes Historical Provider, MD  ALPRAZolam Duanne Moron) 1 MG tablet Take 1 tablet (1 mg total) by mouth 3 (three) times daily as needed for sleep or anxiety. 10/23/14  Yes Leandrew Koyanagi, MD  amLODipine  (NORVASC) 10 MG tablet Take 1 tablet (10 mg total) by mouth daily. 10/23/14  Yes Leandrew Koyanagi, MD  anastrozole (ARIMIDEX) 1 MG tablet Take 1 tablet (1 mg total) by mouth daily. 02/27/14  Yes Wyatt Portela, MD  atenolol (TENORMIN) 50 MG tablet Take 2 tablets (100 mg total) by mouth daily. 10/23/14  Yes Leandrew Koyanagi, MD  Cholecalciferol (VITAMIN D PO) Take 1 capsule by mouth daily.   Yes Historical Provider, MD  glucose blood test strip Use as instructed 03/07/14  Yes Mancel Bale, PA-C  hydrochlorothiazide (MICROZIDE) 12.5 MG capsule Take 1 capsule (12.5 mg total) by mouth daily. 10/23/14  Yes Leandrew Koyanagi, MD  metFORMIN (GLUCOPHAGE XR) 500 MG 24 hr tablet Take 1 tablet (500 mg total) by mouth daily. 04/03/14 04/03/15 Yes Leandrew Koyanagi, MD  sulfacetamide-prednisoLONE (VASOCIDIN) 10-0.23 % ophthalmic solution Place 1 drop into both eyes daily as needed (allergies).  10/18/13  Yes Historical Provider, MD   Allergies  Allergen Reactions  . Ace Inhibitors Other (See Comments)    Acute renal failure  . Angiotensin Receptor Blockers Other (See Comments)    Acute Renal Failure    Medications, allergies, past medical history, surgical history, family history, social history and problem list reviewed and updated.      Objective:   Physical Exam  Constitutional: She is oriented to person,  place, and time. She appears well-developed and well-nourished.  BP 114/58 mmHg  Pulse 82  Temp(Src) 97.9 F (36.6 C) (Oral)  Resp 18  Ht 5\' 3"  (1.6 m)  Wt 196 lb (88.905 kg)  BMI 34.73 kg/m2  SpO2 95%   HENT:  Head: Normocephalic and atraumatic.  Right Ear: External ear normal.  Left Ear: External ear normal.  Pulmonary/Chest: Effort normal.  Musculoskeletal:       Left foot: There is tenderness, bony tenderness and swelling. There is normal range of motion.       Feet:  Neurological: She is alert and oriented to person, place, and time.  Skin: Skin is warm and dry.  Psychiatric:  She has a normal mood and affect. Her behavior is normal. Judgment and thought content normal.      [facility] reading (PRIMARY) by  Dr. Elder Cyphers.  Mild degenerative changes dorsal foot.  Results for orders placed or performed in visit on 11/23/14  POCT CBC  Result Value Ref Range   WBC 7.8 4.6 - 10.2 K/uL   Lymph, poc 1.3 0.6 - 3.4   POC LYMPH PERCENT 17.3 10 - 50 %L   MID (cbc) 0.7 0 - 0.9   POC MID % 8.7 0 - 12 %M   POC Granulocyte 5.8 2 - 6.9   Granulocyte percent 74.0 37 - 80 %G   RBC 4.24 4.04 - 5.48 M/uL   Hemoglobin 12.9 12.2 - 16.2 g/dL   HCT, POC 38.7 37.7 - 47.9 %   MCV 91.3 80 - 97 fL   MCH, POC 30.5 27 - 31.2 pg   MCHC 33.4 31.8 - 35.4 g/dL   RDW, POC 17.4 %   Platelet Count, POC 276 142 - 424 K/uL   MPV 6.2 0 - 99.8 fL       Assessment & Plan:  Left foot pain - Plan: DG Foot Complete Left, Uric Acid, POCT CBC, indomethacin (INDOCIN) 50 MG capsule  Swelling of foot joint, left   This is concerning for gout.  We will treat as such and she will let me know if she has any problems or the pain does not improve over the next 4-5 days.  I gave her some extra medications in case she has this same pain again which from her history it sounds like she has had similar problems in the past.  If this is the case and she continues to get these flairs we may need to d/c her HCTZ as it can make her gout.  Windell Hummingbird PA-C  [facility] 11/23/2014 10:21 AM

## 2015-02-18 ENCOUNTER — Other Ambulatory Visit: Payer: Self-pay | Admitting: Oncology

## 2015-02-26 ENCOUNTER — Ambulatory Visit (INDEPENDENT_AMBULATORY_CARE_PROVIDER_SITE_OTHER): Payer: Medicare Other | Admitting: Internal Medicine

## 2015-02-26 VITALS — BP 121/76 | HR 71 | Temp 97.9°F | Resp 16 | Wt 195.6 lb

## 2015-02-26 DIAGNOSIS — E119 Type 2 diabetes mellitus without complications: Secondary | ICD-10-CM

## 2015-02-26 DIAGNOSIS — M10072 Idiopathic gout, left ankle and foot: Secondary | ICD-10-CM | POA: Diagnosis not present

## 2015-02-26 DIAGNOSIS — Z23 Encounter for immunization: Secondary | ICD-10-CM

## 2015-02-26 DIAGNOSIS — I10 Essential (primary) hypertension: Secondary | ICD-10-CM | POA: Diagnosis not present

## 2015-02-26 DIAGNOSIS — F418 Other specified anxiety disorders: Secondary | ICD-10-CM | POA: Diagnosis not present

## 2015-02-26 LAB — POCT CBC
Granulocyte percent: 35.6 %G — AB (ref 37–80)
HEMATOCRIT: 36.2 % — AB (ref 37.7–47.9)
HEMOGLOBIN: 11.5 g/dL — AB (ref 12.2–16.2)
LYMPH, POC: 2 (ref 0.6–3.4)
MCH, POC: 30.1 pg (ref 27–31.2)
MCHC: 31.8 g/dL (ref 31.8–35.4)
MCV: 94.6 fL (ref 80–97)
MID (cbc): 0.4 (ref 0–0.9)
MPV: 6.4 fL (ref 0–99.8)
POC GRANULOCYTE: 1.3 — AB (ref 2–6.9)
POC LYMPH %: 54.1 % — AB (ref 10–50)
POC MID %: 10.3 %M (ref 0–12)
Platelet Count, POC: 285 10*3/uL (ref 142–424)
RBC: 3.83 M/uL — AB (ref 4.04–5.48)
RDW, POC: 19.3 %
WBC: 3.7 10*3/uL — AB (ref 4.6–10.2)

## 2015-02-26 LAB — COMPREHENSIVE METABOLIC PANEL
ALBUMIN: 3.7 g/dL (ref 3.6–5.1)
ALK PHOS: 48 U/L (ref 33–130)
ALT: 9 U/L (ref 6–29)
AST: 14 U/L (ref 10–35)
BUN: 15 mg/dL (ref 7–25)
CALCIUM: 9.1 mg/dL (ref 8.6–10.4)
CO2: 28 mmol/L (ref 20–31)
CREATININE: 0.91 mg/dL (ref 0.60–0.93)
Chloride: 102 mmol/L (ref 98–110)
Glucose, Bld: 183 mg/dL — ABNORMAL HIGH (ref 65–99)
Potassium: 4.1 mmol/L (ref 3.5–5.3)
SODIUM: 140 mmol/L (ref 135–146)
Total Bilirubin: 0.4 mg/dL (ref 0.2–1.2)
Total Protein: 5.9 g/dL — ABNORMAL LOW (ref 6.1–8.1)

## 2015-02-26 LAB — POCT GLYCOSYLATED HEMOGLOBIN (HGB A1C): Hemoglobin A1C: 6.9

## 2015-02-26 LAB — MICROALBUMIN, URINE: Microalb, Ur: <0.2 mg/dL (ref <2.0)

## 2015-02-26 LAB — URIC ACID: URIC ACID, SERUM: 6.7 mg/dL (ref 2.4–7.0)

## 2015-02-26 MED ORDER — ZOSTER VACCINE LIVE 19400 UNT/0.65ML ~~LOC~~ SOLR: 0.6500 mL | Freq: Once | SUBCUTANEOUS | Status: DC

## 2015-02-26 NOTE — Progress Notes (Signed)
Subjective:    Patient ID: [name], female    DOB: Jan 18, 1946, 69 y.o.   MRN: [id number]  HPI follow-up  Gout attack--2 so far Last one resp easily to indocin--see chart  [place] recently phq-2 ok   Patient Active Problem List   Diagnosis Date Noted  . Breast cancer of upper-inner quadrant of left female breast--- stable  11/16/2013  . Abnormal LFTs--hep c neg 08/24/2013  . HNP (herniated nucleus pulposus), thoracic 07/17/2013  . History of tobacco use--- now discontinued  05/24/2013  . Depression with anxiety--- doing very well recently  05/24/2013  . Vitamin D deficiency 05/24/2013  . Venous insufficiency 05/24/2013  . H/O bilateral hip replacements 01/16/2013  . BMI 36.0-36.9,adult 11/19/2011  . DM (diabetes mellitus)--- see history and chart. No new developments. No symptoms.  11/19/2011  . HTN (hypertension)--stable  11/19/2011  . Asthma--- irregular need for medication  11/19/2011    Current outpatient prescriptions:  .  albuterol (PROVENTIL HFA;VENTOLIN HFA) 108 (90 BASE) MCG/ACT inhaler, Inhale 1-2 puffs into the lungs every 4 (four) hours as needed for wheezing or shortness of breath., Disp: , Rfl:  .  ALPRAZolam (XANAX) 1 MG tablet, Take 1 tablet (1 mg total) by mouth 3 (three) times daily as needed for sleep or anxiety., Disp: 90 tablet, Rfl: 5 .  amLODipine (NORVASC) 10 MG tablet, Take 1 tablet (10 mg total) by mouth daily., Disp: 90 tablet, Rfl: 3 .  anastrozole (ARIMIDEX) 1 MG tablet, TAKE 1 TABLET BY MOUTH EVERY DAY, Disp: 90 tablet, Rfl: 3 .  atenolol (TENORMIN) 50 MG tablet, Take 2 tablets (100 mg total) by mouth daily., Disp: 180 tablet, Rfl: 3 .  Cholecalciferol (VITAMIN D PO), Take 1 capsule by mouth daily., Disp: , Rfl:  .  glucose blood test strip, Use as instructed, Disp: 100 each, Rfl: 12 .  hydrochlorothiazide (MICROZIDE) 12.5 MG capsule, Take 1 capsule (12.5 mg total) by mouth daily., Disp: 90 capsule, Rfl: 3 .  indomethacin (INDOCIN) 50 MG  capsule, Take 1 capsule (50 mg total) by mouth 3 (three) times daily as needed for moderate pain., Disp: 60 capsule, Rfl: 0 .  metFORMIN (GLUCOPHAGE XR) 500 MG 24 hr tablet, Take 1 tablet (500 mg total) by mouth daily., Disp: 90 tablet, Rfl: 1 .  sulfacetamide-prednisoLONE (VASOCIDIN) 10-0.23 % ophthalmic solution, Place 1 drop into both eyes daily as needed (allergies). , Disp: , Rfl:  Dr Ricki Miller left so she goes to Dr Katy Fitch (Jr)--she just had an eye exam in the last 3 months.  Hx shingles L shoulder Was told at pharm she didn't need vacc cause of this!!! Review of Systems  Constitutional: Negative for activity change, appetite change and unexpected weight change.  Eyes: Negative for photophobia and visual disturbance.  Respiratory: Negative for shortness of breath.   Cardiovascular: Negative for chest pain, palpitations and leg swelling.  Gastrointestinal: Negative for abdominal pain.  Genitourinary: Negative for difficulty urinating.  Neurological: Negative for tremors, weakness and headaches.  Psychiatric/Behavioral: Negative for sleep disturbance, dysphoric mood and decreased concentration.   Social life--she spends a lot of time with family as a grandmother and also has visits from her sister. They often share a few vodkas    Objective:   Physical Exam BP 121/76 mmHg  Pulse 71  Temp(Src) 97.9 F (36.6 C) (Oral)  Resp 16  Wt 195 lb 9.6 oz (88.724 kg) Wt Readings from Last 3 Encounters:  02/26/15 195 lb 9.6 oz (88.724 kg)  11/23/14 196  lb (88.905 kg)  10/23/14 196 lb 9.6 oz (89.177 kg)   HEENT clear No carotid bruit Lungs clear Heart regular without murmur Extremities without edema Good peripheral pulses Neurological intact including no sensory losses on the plantar aspects of both feet Mood good and affect appropriate Thought content normal       Results for orders placed or performed in visit on 02/26/15  POCT glycosylated hemoglobin (Hb A1C)  Result Value Ref  Range   Hemoglobin A1C 6.9   POCT CBC  Result Value Ref Range   WBC 3.7 (A) 4.6 - 10.2 K/uL   Lymph, poc 2.0 0.6 - 3.4   POC LYMPH PERCENT 54.1 (A) 10 - 50 %L   MID (cbc) 0.4 0 - 0.9   POC MID % 10.3 0 - 12 %M   POC Granulocyte 1.3 (A) 2 - 6.9   Granulocyte percent 35.6 (A) 37 - 80 %G   RBC 3.83 (A) 4.04 - 5.48 M/uL   Hemoglobin 11.5 (A) 12.2 - 16.2 g/dL   HCT, POC 36.2 (A) 37.7 - 47.9 %   MCV 94.6 80 - 97 fL   MCH, POC 30.1 27 - 31.2 pg   MCHC 31.8 31.8 - 35.4 g/dL   RDW, POC 19.3 %   Platelet Count, POC 285.0 142 - 424 K/uL   MPV 6.4 0 - 99.8 fL    Assessment & Plan:  Type 2 diabetes mellitus without complication - Plan: POCT glycosylated hemoglobin (Hb A1C), Comprehensive metabolic panel, Microalbumin, urine, POCT CBC  Essential hypertension - Plan: Comprehensive metabolic panel  Depression with anxiety  Idiopathic gout of left foot, unspecified chronicity - Plan: POCT CBC, Uric Acid  Need for prophylactic vaccination and inoculation against influenza - Plan: Flu Vaccine QUAD 36+ mos IM  prevnar zostavax  Meds ordered this encounter  Medications  . zoster vaccine live, PF, (ZOSTAVAX) 84132 UNT/0.65ML injection    Sig: Inject 19,400 Units into the skin once. Administer at pharmacy    Dispense:  1 each    Refill:  0  . metFORMIN (GLUCOPHAGE XR) 500 MG 24 hr tablet    Sig: Take 1 tablet (500 mg total) by mouth daily.    Dispense:  90 tablet    Refill:  1   her other medications have refills Follow-up 6 months  Addendum laboratory 02/28/2015 Results for orders placed or performed in visit on 02/26/15  Comprehensive metabolic panel  Result Value Ref Range   Sodium 140 135 - 146 mmol/L   Potassium 4.1 3.5 - 5.3 mmol/L   Chloride 102 98 - 110 mmol/L   CO2 28 20 - 31 mmol/L   Glucose, Bld 183 (H) 65 - 99 mg/dL   BUN 15 7 - 25 mg/dL   Creat 0.91 0.60 - 0.93 mg/dL   Total Bilirubin 0.4 0.2 - 1.2 mg/dL   Alkaline Phosphatase 48 33 - 130 U/L   AST 14 10 - 35 U/L     ALT 9 6 - 29 U/L   Total Protein 5.9 (L) 6.1 - 8.1 g/dL   Albumin 3.7 3.6 - 5.1 g/dL   Calcium 9.1 8.6 - 10.4 mg/dL  Microalbumin, urine  Result Value Ref Range   Microalb, Ur <0.2 <2.0 mg/dL  Uric Acid  Result Value Ref Range   Uric Acid, Serum 6.7 2.4 - 7.0 mg/dL  POCT glycosylated hemoglobin (Hb A1C)  Result Value Ref Range   Hemoglobin A1C 6.9   POCT CBC  Result Value Ref Range  WBC 3.7 (A) 4.6 - 10.2 K/uL   Lymph, poc 2.0 0.6 - 3.4   POC LYMPH PERCENT 54.1 (A) 10 - 50 %L   MID (cbc) 0.4 0 - 0.9   POC MID % 10.3 0 - 12 %M   POC Granulocyte 1.3 (A) 2 - 6.9   Granulocyte percent 35.6 (A) 37 - 80 %G   RBC 3.83 (A) 4.04 - 5.48 M/uL   Hemoglobin 11.5 (A) 12.2 - 16.2 g/dL   HCT, POC 36.2 (A) 37.7 - 47.9 %   MCV 94.6 80 - 97 fL   MCH, POC 30.1 27 - 31.2 pg   MCHC 31.8 31.8 - 35.4 g/dL   RDW, POC 19.3 %   Platelet Count, POC 285.0 142 - 424 K/uL   MPV 6.4 0 - 99.8 fL   We will review recheck this mild anemia at her follow-up Gout stable

## 2015-02-28 MED ORDER — METFORMIN HCL ER 500 MG PO TB24: 500.0000 mg | ORAL_TABLET | Freq: Every day | ORAL | Status: DC

## 2015-03-11 ENCOUNTER — Other Ambulatory Visit (HOSPITAL_BASED_OUTPATIENT_CLINIC_OR_DEPARTMENT_OTHER): Payer: Medicare Other

## 2015-03-11 ENCOUNTER — Telehealth: Payer: Self-pay | Admitting: Oncology

## 2015-03-11 ENCOUNTER — Ambulatory Visit (HOSPITAL_BASED_OUTPATIENT_CLINIC_OR_DEPARTMENT_OTHER): Payer: Medicare Other | Admitting: Oncology

## 2015-03-11 VITALS — BP 148/78 | HR 114 | Temp 97.9°F | Resp 18 | Ht 63.0 in | Wt 190.9 lb

## 2015-03-11 DIAGNOSIS — C50212 Malignant neoplasm of upper-inner quadrant of left female breast: Secondary | ICD-10-CM

## 2015-03-11 DIAGNOSIS — C50112 Malignant neoplasm of central portion of left female breast: Secondary | ICD-10-CM

## 2015-03-11 LAB — CBC WITH DIFFERENTIAL/PLATELET
BASO%: 0.5 % (ref 0.0–2.0)
BASOS ABS: 0 10*3/uL (ref 0.0–0.1)
EOS ABS: 0 10*3/uL (ref 0.0–0.5)
EOS%: 0.5 % (ref 0.0–7.0)
HEMATOCRIT: 43 % (ref 34.8–46.6)
HEMOGLOBIN: 14.7 g/dL (ref 11.6–15.9)
LYMPH#: 2 10*3/uL (ref 0.9–3.3)
LYMPH%: 46.5 % (ref 14.0–49.7)
MCH: 32.5 pg (ref 25.1–34.0)
MCHC: 34.2 g/dL (ref 31.5–36.0)
MCV: 95.1 fL (ref 79.5–101.0)
MONO#: 0.4 10*3/uL (ref 0.1–0.9)
MONO%: 9.4 % (ref 0.0–14.0)
NEUT#: 1.8 10*3/uL (ref 1.5–6.5)
NEUT%: 43.1 % (ref 38.4–76.8)
NRBC: 1 % — AB (ref 0–0)
PLATELETS: 250 10*3/uL (ref 145–400)
RBC: 4.52 10*6/uL (ref 3.70–5.45)
RDW: 19.3 % — AB (ref 11.2–14.5)
WBC: 4.3 10*3/uL (ref 3.9–10.3)

## 2015-03-11 LAB — COMPREHENSIVE METABOLIC PANEL (CC13)
ALBUMIN: 3.2 g/dL — AB (ref 3.5–5.0)
ALK PHOS: 115 U/L (ref 40–150)
ALT: 67 U/L — ABNORMAL HIGH (ref 0–55)
AST: 128 U/L — ABNORMAL HIGH (ref 5–34)
Anion Gap: 20 mEq/L — ABNORMAL HIGH (ref 3–11)
BILIRUBIN TOTAL: 2.22 mg/dL — AB (ref 0.20–1.20)
BUN: 17 mg/dL (ref 7.0–26.0)
CALCIUM: 8.2 mg/dL — AB (ref 8.4–10.4)
CO2: 24 mEq/L (ref 22–29)
Chloride: 90 mEq/L — ABNORMAL LOW (ref 98–109)
Creatinine: 1.5 mg/dL — ABNORMAL HIGH (ref 0.6–1.1)
EGFR: 42 mL/min/{1.73_m2} — AB (ref >90)
GLUCOSE: 265 mg/dL — AB (ref 70–140)
Potassium: 3.6 mEq/L (ref 3.5–5.1)
SODIUM: 135 meq/L — AB (ref 136–145)
TOTAL PROTEIN: 6 g/dL — AB (ref 6.4–8.3)

## 2015-03-11 MED ORDER — ANASTROZOLE 1 MG PO TABS: 1.0000 mg | ORAL_TABLET | Freq: Every day | ORAL | Status: DC

## 2015-03-11 NOTE — Telephone Encounter (Signed)
Gave patient avs report and appointments for March 2017

## 2015-03-11 NOTE — Progress Notes (Signed)
Hematology and Oncology Follow Up Visit  [name] [id number] [birth year] 69 y.o. 03/11/2015 9:06 AM DOOLITTLE, Linton Ham, MDDoolittle, Linton Ham, MD   Principle Diagnosis: 69 year old woman with multifocal left sided breast cancer diagnosed in June of 2015. 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 Ki- 67 is 9% with a recurrence score of 6 on Oncotype DX.   Prior Therapy: She is status post left mastectomy with sentinel lymph node biopsy on 12/20/2013. The final pathology showed invasive ductal carcinoma pathological staging T1cN0. Complicated with chest wall healing and flap necrosis.  Current therapy: Arimidex 1 mg daily started in August of 2015.  Interim History:  Ms. Grosshans presents today for a followup visit. Since her last visit, she continues to do well. She was recently on a cruise ship for vacation and did very well without any complications. She is currently on Arimidex without any new side effects. She did not report any arthralgias, myalgias or any GI toxicity. She did not report any pain on her chest wall. She does report intermittent knee pain that is arthritic in nature. She has regained all activities of daily living without any decline. She did not report any chest wall masses or axillary lymphadenopathy.  She has not had any headaches or blurry vision. Does not report any syncope or seizures. She has not had any fevers or chills or change in her activity level. He does not report any palpitation or lymphedema. She reports now any shortness of breath or cough. She is not report report any nausea or vomiting. She reports no abdominal pain and distention. She reports no constipation or diarrhea. She does not report any petechiae or lymphadenopathy. The rest of her review of systems is unremarkable.  Medications: I have reviewed the patient's current medications.  Current Outpatient Prescriptions  Medication Sig Dispense  Refill  . albuterol (PROVENTIL HFA;VENTOLIN HFA) 108 (90 BASE) MCG/ACT inhaler Inhale 1-2 puffs into the lungs every 4 (four) hours as needed for wheezing or shortness of breath.    . ALPRAZolam (XANAX) 1 MG tablet Take 1 tablet (1 mg total) by mouth 3 (three) times daily as needed for sleep or anxiety. 90 tablet 5  . amLODipine (NORVASC) 10 MG tablet Take 1 tablet (10 mg total) by mouth daily. 90 tablet 3  . anastrozole (ARIMIDEX) 1 MG tablet Take 1 tablet (1 mg total) by mouth daily. 90 tablet 3  . atenolol (TENORMIN) 50 MG tablet Take 2 tablets (100 mg total) by mouth daily. 180 tablet 3  . Cholecalciferol (VITAMIN D PO) Take 1 capsule by mouth daily.    Marland Kitchen glucose blood test strip Use as instructed 100 each 12  . hydrochlorothiazide (MICROZIDE) 12.5 MG capsule Take 1 capsule (12.5 mg total) by mouth daily. 90 capsule 3  . indomethacin (INDOCIN) 50 MG capsule Take 1 capsule (50 mg total) by mouth 3 (three) times daily as needed for moderate pain. 60 capsule 0  . metFORMIN (GLUCOPHAGE XR) 500 MG 24 hr tablet Take 1 tablet (500 mg total) by mouth daily. 90 tablet 1  . sulfacetamide-prednisoLONE (VASOCIDIN) 10-0.23 % ophthalmic solution Place 1 drop into both eyes daily as needed (allergies).     . zoster vaccine live, PF, (ZOSTAVAX) 81829 UNT/0.65ML injection Inject 19,400 Units into the skin once. Administer at pharmacy 1 each 0   No current facility-administered medications for this visit.     Allergies:  Allergies  Allergen Reactions  .  Ace Inhibitors Other (See Comments)    Acute renal failure  . Angiotensin Receptor Blockers Other (See Comments)    Acute Renal Failure    Past Medical History, Surgical history, Social history, and Family History were reviewed and updated.   Physical Exam: Blood pressure 148/78, pulse 114, temperature 97.9 F (36.6 C), temperature source Oral, resp. rate 18, height 5' 3"  (1.6 m), weight 190 lb 14.4 oz (86.592 kg), SpO2 98 %. ECOG: 0 General  appearance: alert and cooperative appeared in no distress. Head: Normocephalic, without obvious abnormality Neck: no adenopathy Lymph nodes: Cervical, supraclavicular, and axillary nodes normal. Heart:regular rate and rhythm, S1, S2 normal, no murmur, click, rub or gallop Lung:chest clear, no wheezing, rales, normal symmetric air entry Chest wall examination revealed well-healed. Left mastectomy site appeared normal. No masses or lesions. Abdomin: soft, non-tender, without masses or organomegaly EXT:no erythema, induration, or nodules   Lab Results: Lab Results  Component Value Date   WBC 4.3 03/11/2015   HGB 14.7 03/11/2015   HCT 43.0 03/11/2015   MCV 95.1 03/11/2015   PLT 250 03/11/2015     Chemistry      Component Value Date/Time   NA 140 02/26/2015 1040   NA 143 09/03/2014 0816   K 4.1 02/26/2015 1040   K 3.3* 09/03/2014 0816   CL 102 02/26/2015 1040   CO2 28 02/26/2015 1040   CO2 30* 09/03/2014 0816   BUN 15 02/26/2015 1040   BUN 16.2 09/03/2014 0816   CREATININE 0.91 02/26/2015 1040   CREATININE 0.8 09/03/2014 0816   CREATININE 0.91 01/17/2014 0929      Component Value Date/Time   CALCIUM 9.1 02/26/2015 1040   CALCIUM 9.0 09/03/2014 0816   ALKPHOS 48 02/26/2015 1040   ALKPHOS 70 09/03/2014 0816   AST 14 02/26/2015 1040   AST 29 09/03/2014 0816   ALT 9 02/26/2015 1040   ALT 18 09/03/2014 0816   BILITOT 0.4 02/26/2015 1040   BILITOT 0.61 09/03/2014 0816        Impression and Plan:  69 year old woman with the following issues:  1. 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 Ki-67 of 9%. She has a low recurrence score of 6 on her Oncotype DX. 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 She is status post left mastectomy in June of 2015 followed by debridements in July of 2015.   She is currently on Arimidex and tolerated it well. The plan is to continue to come medication for at  least 5 years at least.  2. Flap necrosis: Seems to be have healed well at this time.  3. Mammography surveillance: Her last mammogram was in April 2016.  4. Osteoporosis prophylaxis: Will lead to continue monitor this on aromatase inhibitors.  5. Followup: Will be in 6 months.     [facility], MD 9/6/20169:06 AM

## 2015-04-14 ENCOUNTER — Other Ambulatory Visit: Payer: Self-pay | Admitting: Internal Medicine

## 2015-04-14 NOTE — Telephone Encounter (Signed)
Rx printed at 104. Will bring to 102 after clinic.  Meds ordered this encounter  Medications  . ALPRAZolam (XANAX) 1 MG tablet    Sig: TAKE 1 TABLET BY MOUTH 3 TIMES DAILY AS NEEDED FOR SLEEP OR ANXIETY    Dispense:  90 tablet    Refill:  0    Not to exceed 5 additional fills before 04/21/2015.

## 2015-04-14 NOTE — Telephone Encounter (Signed)
Faxed

## 2015-05-07 ENCOUNTER — Other Ambulatory Visit: Payer: Self-pay | Admitting: Physician Assistant

## 2015-06-02 ENCOUNTER — Encounter: Payer: Self-pay | Admitting: Internal Medicine

## 2015-08-20 ENCOUNTER — Encounter: Payer: Self-pay | Admitting: Internal Medicine

## 2015-08-20 ENCOUNTER — Ambulatory Visit (INDEPENDENT_AMBULATORY_CARE_PROVIDER_SITE_OTHER): Payer: Medicare Other | Admitting: Internal Medicine

## 2015-08-20 VITALS — BP 136/84 | HR 87 | Temp 98.4°F | Resp 18 | Ht 63.0 in | Wt 186.0 lb

## 2015-08-20 DIAGNOSIS — M10072 Idiopathic gout, left ankle and foot: Secondary | ICD-10-CM

## 2015-08-20 DIAGNOSIS — Z6836 Body mass index (BMI) 36.0-36.9, adult: Secondary | ICD-10-CM

## 2015-08-20 DIAGNOSIS — J4520 Mild intermittent asthma, uncomplicated: Secondary | ICD-10-CM | POA: Diagnosis not present

## 2015-08-20 DIAGNOSIS — M109 Gout, unspecified: Secondary | ICD-10-CM | POA: Insufficient documentation

## 2015-08-20 DIAGNOSIS — E119 Type 2 diabetes mellitus without complications: Secondary | ICD-10-CM | POA: Diagnosis not present

## 2015-08-20 DIAGNOSIS — I10 Essential (primary) hypertension: Secondary | ICD-10-CM | POA: Diagnosis not present

## 2015-08-20 DIAGNOSIS — F418 Other specified anxiety disorders: Secondary | ICD-10-CM | POA: Diagnosis not present

## 2015-08-20 LAB — POCT GLYCOSYLATED HEMOGLOBIN (HGB A1C): Hemoglobin A1C: 6.5

## 2015-08-20 MED ORDER — METFORMIN HCL ER 500 MG PO TB24: 500.0000 mg | ORAL_TABLET | Freq: Every day | ORAL | Status: DC

## 2015-08-20 MED ORDER — HYDROCHLOROTHIAZIDE 12.5 MG PO CAPS
12.5000 mg | ORAL_CAPSULE | Freq: Every day | ORAL | Status: DC
Start: 2015-08-20 — End: 2015-09-12

## 2015-08-20 MED ORDER — ATENOLOL 50 MG PO TABS: 100.0000 mg | ORAL_TABLET | Freq: Every day | ORAL | Status: DC

## 2015-08-20 MED ORDER — ALPRAZOLAM 1 MG PO TABS: ORAL_TABLET | ORAL | Status: DC

## 2015-08-20 MED ORDER — AMLODIPINE BESYLATE 10 MG PO TABS: 10.0000 mg | ORAL_TABLET | Freq: Every day | ORAL | Status: DC

## 2015-08-20 MED ORDER — GLUCOSE BLOOD VI STRP: ORAL_STRIP | Status: DC

## 2015-08-20 NOTE — Progress Notes (Addendum)
Subjective:    Patient ID: [name], female    DOB: 09-Jul-1945, 70 y.o.   MRN: [id number]  HPI follow-up Admits lots of recent depression issues Grandson killed 11/16--shot in head-he had been imprisoned. His mother, her daughter, and deserted the family in pursuit of drugs more than once although she has eventually resolved her crack addiction. He is constantly been in trouble and this was a result of some association with the wrong people. She has been in tears frequently, occasionally drinking too much to treat her depression. She feels like she is in control of this better at this point and would not like medication. Birthday last weekend with lots of etoh and not much food  Xanax once a day as she is done for many years to help with any sort of anxiety or trouble with being anxious in large groups of people or even for difficulty falling asleep when her mind is racing.  Didn't do zostavax/still hesitant//she says the pharmacy told her because she has had chickenpox and one episode of shingles that she does not need this vaccine.  Had opthal dr Katy Fitch in 2016  Patient Active Problem List   Diagnosis Date Noted  . Gout 08/20/2015  . Skin flap necrosis ([place]) 01/17/2014  . Breast cancer of upper-inner quadrant of left female breast ([place]) 11/16/2013  . Abnormal LFTs--hep c neg 08/24/2013  . HNP (herniated nucleus pulposus), thoracic 07/17/2013  . History of tobacco use 05/24/2013  . Depression with anxiety 05/24/2013  . Vitamin D deficiency 05/24/2013  . Venous insufficiency 05/24/2013  . H/O bilateral hip replacements 01/16/2013  . BMI 36.0-36.9,adult 11/19/2011  . DM (diabetes mellitus) ([place]) 11/19/2011  . HTN (hypertension) 11/19/2011  . Asthma 11/19/2011      Review of Systems  Constitutional: Negative for fever, activity change and unexpected weight change.       Depression symptoms make her lose her appetite but her weight is relatively unchanged.  HENT: Negative  for trouble swallowing.   Eyes: Negative for visual disturbance.  Respiratory: Negative for chest tightness and shortness of breath.   Cardiovascular: Negative for chest pain, palpitations and leg swelling.  Gastrointestinal: Negative for abdominal pain.  Genitourinary: Negative for difficulty urinating.  Musculoskeletal: Negative for back pain.  Neurological: Negative for headaches.  Psychiatric/Behavioral: Negative for self-injury.  Gout recent L foot--resolved with 2 d leftover pred   occas cough from underlying asthma--she feels not often enough to change meds Note hx ACE and ARB problems Objective:   Physical Exam Wt Readings from Last 3 Encounters:  08/20/15 186 lb (84.369 kg)  03/11/15 190 lb 14.4 oz (86.592 kg)  02/26/15 195 lb 9.6 oz (88.724 kg)   BP 136/84 mmHg  Pulse 87  Temp(Src) 98.4 F (36.9 C)  Resp 18  Ht 5\' 3"  (1.6 m)  Wt 186 lb (84.369 kg)  BMI 32.96 kg/m2  Overweight HEENT clear Heart regular without murmur Lungs clear without wheezing on forced expiration Abdomen benign Extremities without edema Diabetic foot exam normal Her mood is good today and affect appropriate  Results for orders placed or performed in visit on 08/20/15  POCT glycosylated hemoglobin (Hb A1C)  Result Value Ref Range   Hemoglobin A1C 6.5        Assessment & Plan:  appt D Gessner 6 months  Patient Active Problem List   Diagnosis Date Noted  . Gout 08/20/2015  . Skin flap necrosis ([place]) 01/17/2014  . Breast cancer of upper-inner quadrant of left  female breast ([place]) 11/16/2013  . Abnormal LFTs--hep c neg 08/24/2013  . HNP (herniated nucleus pulposus), thoracic 07/17/2013  . History of tobacco use 05/24/2013  . Depression with anxiety 05/24/2013  . Vitamin D deficiency 05/24/2013  . Venous insufficiency 05/24/2013  . H/O bilateral hip replacements 01/16/2013  . BMI 36.0-36.9,adult 11/19/2011  . DM (diabetes mellitus) ([place]) 11/19/2011  . HTN (hypertension) 11/19/2011  .  Asthma 11/19/2011   Meds ordered this encounter  Medications  . glucose blood (ONE TOUCH ULTRA TEST) test strip    Sig: USE AS INSTRUCTED each morning/Dispense with equal number of lancets    Dispense:  100 each    Refill:  1  . metFORMIN (GLUCOPHAGE XR) 500 MG 24 hr tablet    Sig: Take 1 tablet (500 mg total) by mouth daily.    Dispense:  90 tablet    Refill:  1  . hydrochlorothiazide (MICROZIDE) 12.5 MG capsule    Sig: Take 1 capsule (12.5 mg total) by mouth daily.    Dispense:  90 capsule    Refill:  3  . atenolol (TENORMIN) 50 MG tablet    Sig: Take 2 tablets (100 mg total) by mouth daily.    Dispense:  180 tablet    Refill:  3  . amLODipine (NORVASC) 10 MG tablet    Sig: Take 1 tablet (10 mg total) by mouth daily.    Dispense:  90 tablet    Refill:  3  . ALPRAZolam (XANAX) 1 MG tablet    Sig: TAKE 1 TABLET BY MOUTH 3 TIMES DAILY AS NEEDED FOR SLEEP OR ANXIETY    Dispense:  90 tablet    Refill:  1    Not to exceed 5 additional fills before 04/21/2015.   Discussed her grief process congrats on continued A1C control  Addend-labs Results for orders placed or performed in visit on 08/20/15  Hepatitis C antibody  Result Value Ref Range   HCV Ab NEGATIVE NEGATIVE  Comprehensive metabolic panel  Result Value Ref Range   Sodium 138 135 - 146 mmol/L   Potassium 3.2 (L) 3.5 - 5.3 mmol/L   Chloride 95 (L) 98 - 110 mmol/L   CO2 29 20 - 31 mmol/L   Glucose, Bld 164 (H) 65 - 99 mg/dL   BUN 9 7 - 25 mg/dL   Creat 0.89 0.60 - 0.93 mg/dL   Total Bilirubin 0.8 0.2 - 1.2 mg/dL   Alkaline Phosphatase 70 33 - 130 U/L   AST 32 10 - 35 U/L   ALT 18 6 - 29 U/L   Total Protein 6.2 6.1 - 8.1 g/dL   Albumin 3.5 (L) 3.6 - 5.1 g/dL   Calcium 8.3 (L) 8.6 - 10.4 mg/dL  Lipid panel  Result Value Ref Range   Cholesterol 139 125 - 200 mg/dL   Triglycerides 287 (H) <150 mg/dL   HDL 82 >=46 mg/dL   Total CHOL/HDL Ratio 1.7 <=5.0 Ratio   VLDL 57 (H) <30 mg/dL   LDL Cholesterol NOT CALC  <130 mg/dL  Uric Acid  Result Value Ref Range   Uric Acid, Serum 9.0 (H) 2.4 - 7.0 mg/dL  POCT glycosylated hemoglobin (Hb A1C)  Result Value Ref Range   Hemoglobin A1C 6.5    Will need allopurinol Needs KCl 10 qd  and retesting 2-3 wks ? Low Ca due to low album 2 to etoh vs other--will add pth next labs

## 2015-08-21 LAB — COMPREHENSIVE METABOLIC PANEL
ALK PHOS: 70 U/L (ref 33–130)
ALT: 18 U/L (ref 6–29)
AST: 32 U/L (ref 10–35)
Albumin: 3.5 g/dL — ABNORMAL LOW (ref 3.6–5.1)
BUN: 9 mg/dL (ref 7–25)
CALCIUM: 8.3 mg/dL — AB (ref 8.6–10.4)
CO2: 29 mmol/L (ref 20–31)
Chloride: 95 mmol/L — ABNORMAL LOW (ref 98–110)
Creat: 0.89 mg/dL (ref 0.60–0.93)
Glucose, Bld: 164 mg/dL — ABNORMAL HIGH (ref 65–99)
POTASSIUM: 3.2 mmol/L — AB (ref 3.5–5.3)
Sodium: 138 mmol/L (ref 135–146)
TOTAL PROTEIN: 6.2 g/dL (ref 6.1–8.1)
Total Bilirubin: 0.8 mg/dL (ref 0.2–1.2)

## 2015-08-21 LAB — LIPID PANEL
CHOL/HDL RATIO: 1.7 ratio (ref <=5.0)
CHOLESTEROL: 139 mg/dL (ref 125–200)
HDL: 82 mg/dL (ref >=46)
TRIGLYCERIDES: 287 mg/dL — AB (ref <150)
VLDL: 57 mg/dL — ABNORMAL HIGH (ref <30)

## 2015-08-21 LAB — HEPATITIS C ANTIBODY: HCV Ab: NEGATIVE

## 2015-08-21 LAB — URIC ACID: Uric Acid, Serum: 9 mg/dL — ABNORMAL HIGH (ref 2.4–7.0)

## 2015-08-27 ENCOUNTER — Encounter: Payer: Self-pay | Admitting: Internal Medicine

## 2015-08-27 MED ORDER — POTASSIUM CHLORIDE ER 10 MEQ PO TBCR: 10.0000 meq | EXTENDED_RELEASE_TABLET | Freq: Every day | ORAL | Status: DC

## 2015-08-27 NOTE — Addendum Note (Signed)
Addended by: Leandrew Koyanagi on: 08/27/2015 12:05 PM   Modules accepted: Orders

## 2015-09-10 ENCOUNTER — Emergency Department (HOSPITAL_COMMUNITY): Payer: Medicare Other

## 2015-09-10 ENCOUNTER — Encounter (HOSPITAL_COMMUNITY): Payer: Self-pay

## 2015-09-10 ENCOUNTER — Ambulatory Visit (INDEPENDENT_AMBULATORY_CARE_PROVIDER_SITE_OTHER): Payer: Medicare Other | Admitting: Physician Assistant

## 2015-09-10 ENCOUNTER — Observation Stay (HOSPITAL_COMMUNITY)
Admission: EM | Admit: 2015-09-10 | Discharge: 2015-09-12 | Disposition: A | Discharge status: Home / Self Care | Payer: Medicare Other | Attending: Internal Medicine | Admitting: Internal Medicine

## 2015-09-10 VITALS — BP 122/64 | HR 93 | Temp 98.3°F | Resp 18

## 2015-09-10 DIAGNOSIS — M6289 Other specified disorders of muscle: Secondary | ICD-10-CM

## 2015-09-10 DIAGNOSIS — J45909 Unspecified asthma, uncomplicated: Secondary | ICD-10-CM | POA: Insufficient documentation

## 2015-09-10 DIAGNOSIS — Z853 Personal history of malignant neoplasm of breast: Secondary | ICD-10-CM | POA: Diagnosis not present

## 2015-09-10 DIAGNOSIS — E119 Type 2 diabetes mellitus without complications: Secondary | ICD-10-CM

## 2015-09-10 DIAGNOSIS — Z7984 Long term (current) use of oral hypoglycemic drugs: Secondary | ICD-10-CM | POA: Diagnosis not present

## 2015-09-10 DIAGNOSIS — Z87891 Personal history of nicotine dependence: Secondary | ICD-10-CM

## 2015-09-10 DIAGNOSIS — R531 Weakness: Secondary | ICD-10-CM

## 2015-09-10 DIAGNOSIS — C50212 Malignant neoplasm of upper-inner quadrant of left female breast: Secondary | ICD-10-CM | POA: Diagnosis present

## 2015-09-10 DIAGNOSIS — J449 Chronic obstructive pulmonary disease, unspecified: Secondary | ICD-10-CM | POA: Diagnosis present

## 2015-09-10 DIAGNOSIS — F418 Other specified anxiety disorders: Secondary | ICD-10-CM | POA: Insufficient documentation

## 2015-09-10 DIAGNOSIS — I10 Essential (primary) hypertension: Secondary | ICD-10-CM | POA: Diagnosis not present

## 2015-09-10 DIAGNOSIS — R0602 Shortness of breath: Secondary | ICD-10-CM | POA: Diagnosis not present

## 2015-09-10 DIAGNOSIS — Z79899 Other long term (current) drug therapy: Secondary | ICD-10-CM | POA: Insufficient documentation

## 2015-09-10 DIAGNOSIS — Z9012 Acquired absence of left breast and nipple: Secondary | ICD-10-CM | POA: Insufficient documentation

## 2015-09-10 DIAGNOSIS — E876 Hypokalemia: Secondary | ICD-10-CM | POA: Diagnosis present

## 2015-09-10 DIAGNOSIS — R269 Unspecified abnormalities of gait and mobility: Secondary | ICD-10-CM | POA: Diagnosis not present

## 2015-09-10 DIAGNOSIS — J441 Chronic obstructive pulmonary disease with (acute) exacerbation: Secondary | ICD-10-CM | POA: Diagnosis not present

## 2015-09-10 DIAGNOSIS — R52 Pain, unspecified: Secondary | ICD-10-CM

## 2015-09-10 DIAGNOSIS — R0902 Hypoxemia: Secondary | ICD-10-CM

## 2015-09-10 DIAGNOSIS — Z96643 Presence of artificial hip joint, bilateral: Secondary | ICD-10-CM | POA: Insufficient documentation

## 2015-09-10 LAB — CBC WITH DIFFERENTIAL/PLATELET
BASOS ABS: 0 10*3/uL (ref 0.0–0.1)
BASOS PCT: 0 %
EOS ABS: 0 10*3/uL (ref 0.0–0.7)
Eosinophils Relative: 0 %
HEMATOCRIT: 38 % (ref 36.0–46.0)
Hemoglobin: 12.7 g/dL (ref 12.0–15.0)
Lymphocytes Relative: 11 %
Lymphs Abs: 1.4 10*3/uL (ref 0.7–4.0)
MCH: 33.3 pg (ref 26.0–34.0)
MCHC: 33.4 g/dL (ref 30.0–36.0)
MCV: 99.7 fL (ref 78.0–100.0)
MONO ABS: 1.2 10*3/uL — AB (ref 0.1–1.0)
Monocytes Relative: 10 %
NEUTROS ABS: 10 10*3/uL — AB (ref 1.7–7.7)
NEUTROS PCT: 79 %
Platelets: 404 10*3/uL — ABNORMAL HIGH (ref 150–400)
RBC: 3.81 MIL/uL — ABNORMAL LOW (ref 3.87–5.11)
RDW: 17.6 % — AB (ref 11.5–15.5)
WBC: 12.6 10*3/uL — ABNORMAL HIGH (ref 4.0–10.5)

## 2015-09-10 LAB — CBG MONITORING, ED: Glucose-Capillary: 152 mg/dL — ABNORMAL HIGH (ref 65–99)

## 2015-09-10 LAB — RAPID URINE DRUG SCREEN, HOSP PERFORMED
AMPHETAMINES: NOT DETECTED
BENZODIAZEPINES: POSITIVE — AB
Barbiturates: NOT DETECTED
COCAINE: NOT DETECTED
OPIATES: NOT DETECTED
TETRAHYDROCANNABINOL: NOT DETECTED

## 2015-09-10 LAB — POCT CBC
GRANULOCYTE PERCENT: 83.4 % — AB (ref 37–80)
HCT, POC: 39.8 % (ref 37.7–47.9)
HEMOGLOBIN: 13.4 g/dL (ref 12.2–16.2)
Lymph, poc: 2.2 (ref 0.6–3.4)
MCH: 33.5 pg — AB (ref 27–31.2)
MCHC: 33.6 g/dL (ref 31.8–35.4)
MCV: 99.7 fL — AB (ref 80–97)
MID (cbc): 0.2 (ref 0–0.9)
MPV: 6.2 fL (ref 0–99.8)
POC Granulocyte: 12.2 — AB (ref 2–6.9)
POC LYMPH PERCENT: 14.9 %L (ref 10–50)
POC MID %: 1.7 % (ref 0–12)
Platelet Count, POC: 484 10*3/uL — AB (ref 142–424)
RBC: 3.99 M/uL — AB (ref 4.04–5.48)
RDW, POC: 19.2 %
WBC: 14.6 10*3/uL — AB (ref 4.6–10.2)

## 2015-09-10 LAB — BASIC METABOLIC PANEL
ANION GAP: 14 (ref 5–15)
BUN: 8 mg/dL (ref 6–20)
CALCIUM: 9.6 mg/dL (ref 8.9–10.3)
CO2: 35 mmol/L — ABNORMAL HIGH (ref 22–32)
CREATININE: 0.77 mg/dL (ref 0.44–1.00)
Chloride: 87 mmol/L — ABNORMAL LOW (ref 101–111)
GLUCOSE: 169 mg/dL — AB (ref 65–99)
Potassium: 2.8 mmol/L — ABNORMAL LOW (ref 3.5–5.1)
Sodium: 136 mmol/L (ref 135–145)

## 2015-09-10 LAB — URINALYSIS, ROUTINE W REFLEX MICROSCOPIC
Bilirubin Urine: NEGATIVE
GLUCOSE, UA: NEGATIVE mg/dL
HGB URINE DIPSTICK: NEGATIVE
Ketones, ur: NEGATIVE mg/dL
Nitrite: NEGATIVE
PH: 6 (ref 5.0–8.0)
Protein, ur: NEGATIVE mg/dL
SPECIFIC GRAVITY, URINE: 1.025 (ref 1.005–1.030)

## 2015-09-10 LAB — URINE MICROSCOPIC-ADD ON

## 2015-09-10 LAB — PHOSPHORUS: PHOSPHORUS: 2.7 mg/dL (ref 2.5–4.6)

## 2015-09-10 LAB — I-STAT CG4 LACTIC ACID, ED
Lactic Acid, Venous: 1.74 mmol/L (ref 0.5–2.0)
Lactic Acid, Venous: 1.18 mmol/L (ref 0.5–2.0)

## 2015-09-10 LAB — GLUCOSE, CAPILLARY: Glucose-Capillary: 135 mg/dL — ABNORMAL HIGH (ref 65–99)

## 2015-09-10 LAB — I-STAT TROPONIN, ED: Troponin i, poc: 0.01 ng/mL (ref 0.00–0.08)

## 2015-09-10 LAB — GLUCOSE, POCT (MANUAL RESULT ENTRY): POC Glucose: 190 mg/dl — AB (ref 70–99)

## 2015-09-10 LAB — D-DIMER, QUANTITATIVE: D-Dimer, Quant: 0.84 ug/mL-FEU — ABNORMAL HIGH (ref 0.00–0.50)

## 2015-09-10 LAB — MAGNESIUM: MAGNESIUM: 1.3 mg/dL — AB (ref 1.7–2.4)

## 2015-09-10 LAB — POCT GLYCOSYLATED HEMOGLOBIN (HGB A1C): Hemoglobin A1C: 6.2

## 2015-09-10 LAB — BRAIN NATRIURETIC PEPTIDE: B Natriuretic Peptide: 66.5 pg/mL (ref 0.0–100.0)

## 2015-09-10 LAB — ETHANOL: Alcohol, Ethyl (B): <5 mg/dL (ref <5)

## 2015-09-10 MED ORDER — ONDANSETRON HCL 4 MG PO TABS: 4.0000 mg | ORAL_TABLET | Freq: Four times a day (QID) | ORAL | Status: DC | PRN

## 2015-09-10 MED ORDER — IOHEXOL 350 MG/ML SOLN
100.0000 mL | Freq: Once | INTRAVENOUS | Status: AC | PRN
  Administered 2015-09-10: 100 mL via INTRAVENOUS

## 2015-09-10 MED ORDER — LEVOFLOXACIN 500 MG PO TABS
500.0000 mg | ORAL_TABLET | Freq: Every day | ORAL | Status: DC
  Administered 2015-09-10 – 2015-09-11 (×2): 500 mg via ORAL
  Filled 2015-09-10 (×2): qty 1

## 2015-09-10 MED ORDER — ONDANSETRON HCL 4 MG/2ML IJ SOLN: 4.0000 mg | Freq: Four times a day (QID) | INTRAMUSCULAR | Status: DC | PRN

## 2015-09-10 MED ORDER — ALPRAZOLAM 0.5 MG PO TABS: 1.0000 mg | ORAL_TABLET | Freq: Three times a day (TID) | ORAL | Status: DC | PRN

## 2015-09-10 MED ORDER — HYDROCHLOROTHIAZIDE 12.5 MG PO CAPS
12.5000 mg | ORAL_CAPSULE | Freq: Every day | ORAL | Status: DC
  Filled 2015-09-10: qty 1

## 2015-09-10 MED ORDER — ATENOLOL 50 MG PO TABS
100.0000 mg | ORAL_TABLET | Freq: Every day | ORAL | Status: DC
  Filled 2015-09-10: qty 2

## 2015-09-10 MED ORDER — ANASTROZOLE 1 MG PO TABS
1.0000 mg | ORAL_TABLET | Freq: Every day | ORAL | Status: DC
  Administered 2015-09-12: 1 mg via ORAL
  Filled 2015-09-10 (×3): qty 1

## 2015-09-10 MED ORDER — MAGNESIUM SULFATE 4 GM/100ML IV SOLN
4.0000 g | Freq: Once | INTRAVENOUS | Status: AC
  Administered 2015-09-10: 4 g via INTRAVENOUS
  Filled 2015-09-10: qty 100

## 2015-09-10 MED ORDER — POTASSIUM CHLORIDE CRYS ER 20 MEQ PO TBCR
40.0000 meq | EXTENDED_RELEASE_TABLET | Freq: Once | ORAL | Status: AC
  Administered 2015-09-10: 40 meq via ORAL
  Filled 2015-09-10: qty 2

## 2015-09-10 MED ORDER — IPRATROPIUM-ALBUTEROL 0.5-2.5 (3) MG/3ML IN SOLN: 3.0000 mL | Freq: Four times a day (QID) | RESPIRATORY_TRACT | Status: DC

## 2015-09-10 MED ORDER — OLOPATADINE HCL 0.1 % OP SOLN: 1.0000 [drp] | Freq: Every day | OPHTHALMIC | Status: DC | PRN

## 2015-09-10 MED ORDER — ENOXAPARIN SODIUM 40 MG/0.4ML ~~LOC~~ SOLN
40.0000 mg | Freq: Every day | SUBCUTANEOUS | Status: DC
  Administered 2015-09-11 – 2015-09-12 (×2): 40 mg via SUBCUTANEOUS
  Filled 2015-09-10 (×2): qty 0.4

## 2015-09-10 MED ORDER — POTASSIUM CHLORIDE IN NACL 40-0.9 MEQ/L-% IV SOLN
INTRAVENOUS | Status: AC
  Administered 2015-09-11: 100 mL/h via INTRAVENOUS
  Filled 2015-09-10: qty 1000

## 2015-09-10 MED ORDER — IPRATROPIUM-ALBUTEROL 0.5-2.5 (3) MG/3ML IN SOLN
3.0000 mL | Freq: Four times a day (QID) | RESPIRATORY_TRACT | Status: DC
  Administered 2015-09-11 (×2): 3 mL via RESPIRATORY_TRACT
  Filled 2015-09-10 (×3): qty 3

## 2015-09-10 MED ORDER — IPRATROPIUM-ALBUTEROL 0.5-2.5 (3) MG/3ML IN SOLN: 3.0000 mL | Freq: Four times a day (QID) | RESPIRATORY_TRACT | Status: DC | PRN

## 2015-09-10 MED ORDER — METHYLPREDNISOLONE SODIUM SUCC 40 MG IJ SOLR
40.0000 mg | Freq: Once | INTRAMUSCULAR | Status: AC
  Administered 2015-09-10: 40 mg via INTRAVENOUS
  Filled 2015-09-10: qty 1

## 2015-09-10 MED ORDER — AMLODIPINE BESYLATE 5 MG PO TABS
5.0000 mg | ORAL_TABLET | Freq: Every day | ORAL | Status: DC
  Filled 2015-09-10: qty 1

## 2015-09-10 MED ORDER — ALBUTEROL SULFATE (2.5 MG/3ML) 0.083% IN NEBU: 2.5000 mg | INHALATION_SOLUTION | RESPIRATORY_TRACT | Status: DC | PRN

## 2015-09-10 MED ORDER — POTASSIUM CHLORIDE CRYS ER 10 MEQ PO TBCR
10.0000 meq | EXTENDED_RELEASE_TABLET | Freq: Two times a day (BID) | ORAL | Status: DC
  Administered 2015-09-10 – 2015-09-12 (×4): 10 meq via ORAL
  Filled 2015-09-10 (×4): qty 1

## 2015-09-10 NOTE — ED Provider Notes (Signed)
CSN: [id number]     Arrival date & time 09/10/15  1422 History   First MD Initiated Contact with Patient 09/10/15 1510     Chief Complaint  Patient presents with  . Weakness     HPI Comments: 70 year old female presents with gradual, persistent, and worsening generalized weakness for the past 2-3 days. Her daughter is in the room with her who helps provide history. Her daughter states that she has noticed her mom not feeling herself for several weeks with worsening in the past couple days. She had a cold a couple weeks ago which has resolved. Her daughter reports her mom mostly lies in the bed and believes that she has been weak on the right side. When she does get up to walk she has to hold on to a wall for balance. They took her to UC this morning who performed labs and did an EKG and were referred to the ED. The patient reports associated fatigue, decreased appetite, SOB with exertion, difficulty walking and L sided hip pain. Denies fever, chills, LOC, dizziness, blurry vision, chest pain, wheezing, cough, L or R sided weakness, blood in urine or stool, irritative voiding symptoms.  The history is provided by the patient and a relative (Daughter).    Past Medical History  Diagnosis Date  . Allergy   . Depression   . Asthma   . Hypertension   . Anxiety   . Breast cancer ([place])     S/P left mastectomy 2015  . Type II diabetes mellitus ([place])   . Migraine 1980's  . 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 Tubal ligation  1973  . Total hip arthroplasty Left   . Total hip arthroplasty Right   . Orif shoulder fracture Left 1964  . Rotator cuff repair Right   . Ankle fracture surgery Left 03/15/2000    with hardware  . Joint replacement    . Mastectomy w/ sentinel node biopsy Left 12/20/2013    Procedure: MASTECTOMY WITH SENTINEL LYMPH NODE BIOPSY;  Surgeon: Merrie Roof, MD;  Location: [facility];  Service: General;  Laterality: Left;  .  Colonoscopy    . Application of wound vac  01/17/2014    w/chest wall debridement  . Mastectomy complete / simple w/ sentinel node biopsy Left   . Abdominal hysterectomy  1981    "fibroids"  . Application of wound vac Left 01/17/2014    Procedure: PLACEMENT OF VAC DRESSING;  Surgeon: Merrie Roof, MD;  Location: [facility];  Service: General;  Laterality: Left;  . Wound debridement Left 01/17/2014    Procedure: DEBRIDEMENT OF CHEST WALL;  Surgeon: Merrie Roof, MD;  Location: [facility];  Service: General;  Laterality: Left;   Family History  Problem Relation Age of Onset  . Asthma Mother   . Stroke Mother   . Asthma Father   . Alcohol abuse Father   . Colon cancer Neg Hx    Social History  Substance Use Topics  . Smoking status: Former Smoker -- 0.50 packs/day for 40 years    Types: Cigarettes    Quit date: 11/11/2000  . Smokeless tobacco: Never Used  . Alcohol Use: Yes     Comment: 01/17/2014 "haven't drank anything since ~ 10/2013 when dx'd w/cancer"   OB History    No data available     Review of Systems  Constitutional: Positive for  activity change and appetite change. Negative for fever and chills.  Respiratory: Positive for shortness of breath. Negative for cough and wheezing.        With exertion  Cardiovascular: Negative for chest pain.  Gastrointestinal: Negative for blood in stool.  Genitourinary: Negative for dysuria, urgency, frequency, hematuria and difficulty urinating.  Musculoskeletal: Positive for arthralgias and gait problem.       Lateral L hip pain  Neurological: Positive for weakness. Negative for dizziness, syncope, facial asymmetry, speech difficulty, light-headedness and numbness.  Psychiatric/Behavioral: Negative for confusion.  All other systems reviewed and are negative.     Allergies  Ace inhibitors and Angiotensin receptor blockers  Home Medications   Prior to Admission medications   Medication Sig Start Date End Date Taking? Authorizing  Provider  ALPRAZolam (XANAX) 1 MG tablet TAKE 1 TABLET BY MOUTH 3 TIMES DAILY AS NEEDED FOR SLEEP OR ANXIETY 08/20/15  Yes Leandrew Koyanagi, MD  amLODipine (NORVASC) 10 MG tablet Take 1 tablet (10 mg total) by mouth daily. 08/20/15  Yes Leandrew Koyanagi, MD  anastrozole (ARIMIDEX) 1 MG tablet Take 1 tablet (1 mg total) by mouth daily. 03/11/15  Yes Wyatt Portela, MD  atenolol (TENORMIN) 50 MG tablet Take 2 tablets (100 mg total) by mouth daily. 08/20/15  Yes Leandrew Koyanagi, MD  Cholecalciferol (VITAMIN D PO) Take 1 capsule by mouth daily.   Yes Historical Provider, MD  hydrochlorothiazide (MICROZIDE) 12.5 MG capsule Take 1 capsule (12.5 mg total) by mouth daily. 08/20/15  Yes Leandrew Koyanagi, MD  indomethacin (INDOCIN) 50 MG capsule Take 1 capsule (50 mg total) by mouth 3 (three) times daily as needed for moderate pain. 11/23/14  Yes Mancel Bale, PA-C  metFORMIN (GLUCOPHAGE XR) 500 MG 24 hr tablet Take 1 tablet (500 mg total) by mouth daily. 08/20/15 08/19/16 Yes Leandrew Koyanagi, MD  potassium chloride (K-DUR) 10 MEQ tablet Take 1 tablet (10 mEq total) by mouth daily. 08/27/15  Yes Leandrew Koyanagi, MD  albuterol (PROVENTIL HFA;VENTOLIN HFA) 108 (90 BASE) MCG/ACT inhaler Inhale 1-2 puffs into the lungs every 4 (four) hours as needed for wheezing or shortness of breath.    Historical Provider, MD  glucose blood (ONE TOUCH ULTRA TEST) test strip USE AS INSTRUCTED each morning/Dispense with equal number of lancets Patient not taking: Reported on 09/10/2015 08/20/15   Leandrew Koyanagi, MD  sulfacetamide-prednisoLONE (VASOCIDIN) 10-0.23 % ophthalmic solution Place 1 drop into both eyes daily as needed (allergies).  10/18/13   Historical Provider, MD  zoster vaccine live, PF, (ZOSTAVAX) 69629 UNT/0.65ML injection Inject 19,400 Units into the skin once. Administer at pharmacy Patient not taking: Reported on 09/10/2015 02/26/15   Leandrew Koyanagi, MD   BP 132/80 mmHg  Pulse 80  Temp(Src) 98 F (36.7  C) (Oral)  Resp 31  SpO2 91%   Physical Exam  Constitutional: She appears well-developed and well-nourished. No distress. Nasal cannula in place.  HENT:  Head: Normocephalic and atraumatic.  Mouth/Throat: Uvula is midline, oropharynx is clear and moist and mucous membranes are normal.  Eyes: Conjunctivae and EOM are normal. Pupils are equal, round, and reactive to light.  Cardiovascular: Normal rate, regular rhythm, S1 normal and S2 normal.  Exam reveals no gallop.   No murmur heard. Pulmonary/Chest: Effort normal and breath sounds normal. No accessory muscle usage. No respiratory distress. She has no decreased breath sounds. She has no wheezes. She has no rhonchi. She has no rales.  Musculoskeletal:  Left hip: She exhibits tenderness. She exhibits normal range of motion, normal strength, no swelling and no deformity.  Tender to palpation over L greater trochanter  Neurological:  Mental Status:  Alert, oriented, thought content appropriate, able to give a coherent history. Speech fluent without evidence of aphasia. Able to follow 2 step commands without difficulty.  Cranial Nerves:  II:  Peripheral visual fields grossly normal, pupils equal, round, reactive to light III,IV, VI: ptosis not present, extra-ocular motions intact bilaterally  V,VII: smile symmetric, facial light touch sensation equal VIII: hearing grossly normal to voice  X: uvula elevates symmetrically  XI: bilateral shoulder shrug symmetric and strong XII: midline tongue extension without fassiculations Motor:  Normal tone. 5/5 in upper and lower extremities bilaterally including strong and equal grip strength and dorsiflexion/plantar flexion Sensory: Pinprick and light touch normal in all extremities.  Deep Tendon Reflexes: 2+ and symmetric in the biceps and patella Cerebellar: normal finger-to-nose with bilateral upper extremities Gait: difficulty with balance, slightly wide based gait. Not overtly ataxic.  Hypoxia noted on monitor with gait eval.  CV: distal pulses palpable throughout     Skin: Skin is warm, dry and intact.  Psychiatric: She has a normal mood and affect. Her behavior is normal. Her speech is delayed.    ED Course  Procedures (including critical care time) Labs Review Labs Reviewed  BASIC METABOLIC PANEL - Abnormal; Notable for the following:    Potassium 2.8 (*)    Chloride 87 (*)    CO2 35 (*)    Glucose, Bld 169 (*)    All other components within normal limits  CBC WITH DIFFERENTIAL/PLATELET - Abnormal; Notable for the following:    WBC 12.6 (*)    RBC 3.81 (*)    RDW 17.6 (*)    Platelets 404 (*)    Neutro Abs 10.0 (*)    Monocytes Absolute 1.2 (*)    All other components within normal limits  URINALYSIS, ROUTINE W REFLEX MICROSCOPIC (NOT AT [facility]) - Abnormal; Notable for the following:    Leukocytes, UA SMALL (*)    All other components within normal limits  URINE RAPID DRUG SCREEN, HOSP PERFORMED - Abnormal; Notable for the following:    Benzodiazepines POSITIVE (*)    All other components within normal limits  D-DIMER, QUANTITATIVE (NOT AT [facility]) - Abnormal; Notable for the following:    D-Dimer, Quant 0.84 (*)    All other components within normal limits  URINE MICROSCOPIC-ADD ON - Abnormal; Notable for the following:    Squamous Epithelial / LPF 0-5 (*)    Bacteria, UA FEW (*)    All other components within normal limits  MAGNESIUM - Abnormal; Notable for the following:    Magnesium 1.3 (*)    All other components within normal limits  CBG MONITORING, ED - Abnormal; Notable for the following:    Glucose-Capillary 152 (*)    All other components within normal limits  ETHANOL  BRAIN NATRIURETIC PEPTIDE  PHOSPHORUS  I-STAT TROPOININ, ED  I-STAT CG4 LACTIC ACID, ED  I-STAT CG4 LACTIC ACID, ED    Imaging Review Ct Head Wo Contrast  09/10/2015  CLINICAL DATA:  Right-sided leg weakness EXAM: CT HEAD WITHOUT CONTRAST TECHNIQUE: Contiguous axial  images were obtained from the base of the skull through the vertex without intravenous contrast. COMPARISON:  None. FINDINGS: The ventricular system is slightly prominent as are the cortical sulci, indicative of mild atrophy. The septum is midline in position. No hemorrhage, mass lesion, or  acute infarction is seen. On bone window images, no acute calvarial abnormality is noted. IMPRESSION: Mild atrophy.  No acute intracranial abnormality. Electronically Signed   By: Ivar Drape M.D.   On: 09/10/2015 17:05   Ct Angio Chest Pe W/cm &/or Wo Cm  09/10/2015  CLINICAL DATA:  Shortness of breath beginning at 1 a.m. last night. Initial encounter. EXAM: CT ANGIOGRAPHY CHEST WITH CONTRAST TECHNIQUE: Multidetector CT imaging of the chest was performed using the standard protocol during bolus administration of intravenous contrast. Multiplanar CT image reconstructions and MIPs were obtained to evaluate the vascular anatomy. CONTRAST:  100 ml OMNIPAQUE IOHEXOL 350 MG/ML SOLN COMPARISON:  Plain film the chest earlier today. FINDINGS: No pulmonary embolus is identified. No pleural or pericardial effusion. Heart size is normal. No axillary, hilar or mediastinal lymphadenopathy. The right lobe of the thyroid is enlarged with some calcifications present. Calcific aortic atherosclerosis is noted. The lungs demonstrate some emphysematous change in the apices. Mild dependent atelectasis is noted in the right the lungs are otherwise clear. Incidentally imaged upper abdomen demonstrates no focal abnormality. No focal bony abnormality is seen. Review of the MIP images confirms the above findings. IMPRESSION: Negative for pulmonary embolus or acute disease. Atherosclerosis. Mild appearing emphysema. Electronically Signed   By: Inge Rise M.D.   On: 09/10/2015 18:55   Mr Brain Wo Contrast  09/10/2015  CLINICAL DATA:  Abnormal gait. Difficulty getting up, particularly with her right side. Difficulty with word-finding. Symptoms for  at least 3 days. EXAM: MRI HEAD WITHOUT CONTRAST TECHNIQUE: Multiplanar, multiecho pulse sequences of the brain and surrounding structures were obtained without intravenous contrast. COMPARISON:  CT head without contrast from the same day. FINDINGS: The diffusion-weighted images demonstrate no evidence for acute or subacute infarction. No acute hemorrhage or mass lesion is present. Mild generalized atrophy and periventricular white matter changes are noted bilaterally. White matter changes extend into the brainstem. Ventricles are proportionate to the degree of atrophy. No significant extra-axial fluid collection is present. The internal auditory canals are within normal limits bilaterally. The cerebellum is normal. There is some abnormal signal within the non dominant right vertebral artery. This may represent slow or occluded flow. Flow is otherwise present within the major intracranial arteries. Globes and orbits are intact. The paranasal sinuses are clear. There is fluid in the mastoid air cells bilaterally. No obstructing nasopharyngeal lesion is evident. The skullbase is normal.  Midline sagittal images are unremarkable. IMPRESSION: 1. Mild generalized atrophy and white matter disease is somewhat advanced for age. This is nonspecific, but likely reflects the sequela of chronic microvascular ischemia. 2. Abnormal signal suggesting slow or occluded flow within the right vertebral artery. 3. No other acute or subacute abnormality is present. Electronically Signed   By: San Morelle M.D.   On: 09/10/2015 20:40   Dg Chest Port 1 View  09/10/2015  CLINICAL DATA:  Intermittent shortness of breath, weakness EXAM: PORTABLE CHEST 1 VIEW COMPARISON:  12/13/2013 FINDINGS: Cardiomediastinal silhouette is stable. No infiltrate or pleural effusion. No pulmonary edema. Degenerative changes bilateral AC joints. IMPRESSION: No active disease. Electronically Signed   By: Lahoma Crocker M.D.   On: 09/10/2015 15:32   I  have personally reviewed and evaluated these images and lab results as part of my medical decision-making.   EKG Interpretation   Date/Time:  Wednesday September 10 2015 16:42:53 EST Ventricular Rate:  79 PR Interval:  146 QRS Duration: 115 QT Interval:  399 QTC Calculation: 457 R Axis:   -61  Text Interpretation:  Sinus rhythm Incomplete left bundle branch block LVH  with secondary repolarization abnormality Confirmed by Christy Gentles  MD,  DONALD ([id number]) on 09/10/2015 4:46:52 PM       MDM   Final diagnoses:  Hypoxia  Generalized weakness  Gait abnormality    Pt is hypoxic while ambulating and is requiring 2L of O2 on [place]. Does not weak O2 at home   Low suspicion for DVT/PE due to absence of tachycardia, minimal SOB, no swelling, calf tenderness, or palpable cords on exam. However d-dimer was positive and she has been relatively bed bound due to depression and mobility issues and becomes hypoxic and dyspneic with activity. CTA neg for PE however shows emphysema in the apices bilaterally. Former smoker - quit in 2002.  Low suspicion for CVA due to absence of focal weakness or cranial nerve deficits. CT of head neg for acute processes. MRI shows mild generalized atrophy  Pt is not febrile but has elevated WBC count. CXR neg for PNA.  Has hypokalemia at baseline per PCP labs. K was repleted here in ED.  Pt will be admitted. Pt was seen in conjunction with and evaluated by Dr. Christy Gentles.    Recardo Evangelist, PA-C 09/10/15 2126  Ripley Fraise, MD 09/11/15 [phone]

## 2015-09-10 NOTE — ED Notes (Signed)
Pt. BIB [facility] for evaluation of weakness. Pt. Seen at [facility] for R sided weakness and pt. Daughter thought she was slow to respond intermittently. Pt. AxO x4. Denies pain. Hx gout to bilateral feet. CBG 188. Pt. From home.

## 2015-09-10 NOTE — Progress Notes (Signed)
[facility] [address] [place] [address] [phone]  Date:  09/10/2015   Name:  [name]   DOB:  1946-04-12   MRN:  [id number]  PCP:  Leandrew Koyanagi, MD    History of Present Illness:  [name] is a 70 y.o. female patient who presents to [facility] for cc of trouble walking.    Three days of trying to get to the bathroom, and at times she can not make it.  Daughter has noticed that she has great difficulty getting up from her right side.  Patient has not noticed this.  Daughter noticed it takes a long time to get an answer from the phone, as if she is fishing for words.  No facial numbness or tingling.  No extremity numbness or tingling.    She had a cold about one week ago, but this seemed to resolve, and she is no longer coughing.  She has no hx of MI or stroke.  Low appetite.  She has no nausea or vomiting.  No chest pains, diaphoresis.  Daughter noticed at times she is short of breath.    Patient Active Problem List   Diagnosis Date Noted  . Gout 08/20/2015  . Skin flap necrosis ([place]) 01/17/2014  . Breast cancer of upper-inner quadrant of left female breast ([place]) 11/16/2013  . Abnormal LFTs--hep c neg 08/24/2013  . HNP (herniated nucleus pulposus), thoracic 07/17/2013  . History of tobacco use 05/24/2013  . Depression with anxiety 05/24/2013  . Vitamin D deficiency 05/24/2013  . Venous insufficiency 05/24/2013  . H/O bilateral hip replacements 01/16/2013  . BMI 36.0-36.9,adult 11/19/2011  . DM (diabetes mellitus) ([place]) 11/19/2011  . HTN (hypertension) 11/19/2011  . Asthma 11/19/2011    Past Medical History  Diagnosis Date  . Allergy   . Depression   . Asthma   . Hypertension   . Anxiety   . Breast cancer ([place])     S/P left mastectomy 2015  . Type II diabetes mellitus ([place])   . Migraine 1980's  . Arthritis     "was in my hips"    Past Surgical History  Procedure Laterality Date  . Tubal ligation  1973  . Total hip  arthroplasty Left   . Total hip arthroplasty Right   . Orif shoulder fracture Left 1964  . Rotator cuff repair Right   . Ankle fracture surgery Left 03/15/2000    with hardware  . Joint replacement    . Mastectomy w/ sentinel node biopsy Left 12/20/2013    Procedure: MASTECTOMY WITH SENTINEL LYMPH NODE BIOPSY;  Surgeon: Merrie Roof, MD;  Location: [place];  Service: General;  Laterality: Left;  . Colonoscopy    . Application of wound vac  01/17/2014    w/chest wall debridement  . Mastectomy complete / simple w/ sentinel node biopsy Left   . Abdominal hysterectomy  1981    "fibroids"  . Application of wound vac Left 01/17/2014    Procedure: PLACEMENT OF VAC DRESSING;  Surgeon: Merrie Roof, MD;  Location: [place];  Service: General;  Laterality: Left;  . Wound debridement Left 01/17/2014    Procedure: DEBRIDEMENT OF CHEST WALL;  Surgeon: Merrie Roof, MD;  Location[facility];  Service: General;  Laterality: Left;    Social History  Substance Use Topics  . Smoking status: Former Smoker -- 0.50 packs/day for 40 years    Types: Cigarettes  Quit date: 11/11/2000  . Smokeless tobacco: Never Used  . Alcohol Use: Yes     Comment: 01/17/2014 "haven't drank anything since ~ 10/2013 when dx'd w/cancer"    Family History  Problem Relation Age of Onset  . Asthma Mother   . Stroke Mother   . Asthma Father   . Alcohol abuse Father   . Colon cancer Neg Hx     Allergies  Allergen Reactions  . Ace Inhibitors Other (See Comments)    Acute renal failure  . Angiotensin Receptor Blockers Other (See Comments)    Acute Renal Failure    Medication list has been reviewed and updated.  Current Outpatient Prescriptions on File Prior to Visit  Medication Sig Dispense Refill  . albuterol (PROVENTIL HFA;VENTOLIN HFA) 108 (90 BASE) MCG/ACT inhaler Inhale 1-2 puffs into the lungs every 4 (four) hours as needed for wheezing or shortness of breath.    . ALPRAZolam (XANAX) 1 MG tablet TAKE 1 TABLET BY  MOUTH 3 TIMES DAILY AS NEEDED FOR SLEEP OR ANXIETY 90 tablet 1  . amLODipine (NORVASC) 10 MG tablet Take 1 tablet (10 mg total) by mouth daily. 90 tablet 3  . anastrozole (ARIMIDEX) 1 MG tablet Take 1 tablet (1 mg total) by mouth daily. 90 tablet 3  . atenolol (TENORMIN) 50 MG tablet Take 2 tablets (100 mg total) by mouth daily. 180 tablet 3  . Cholecalciferol (VITAMIN D PO) Take 1 capsule by mouth daily.    Marland Kitchen glucose blood (ONE TOUCH ULTRA TEST) test strip USE AS INSTRUCTED each morning/Dispense with equal number of lancets 100 each 1  . hydrochlorothiazide (MICROZIDE) 12.5 MG capsule Take 1 capsule (12.5 mg total) by mouth daily. 90 capsule 3  . indomethacin (INDOCIN) 50 MG capsule Take 1 capsule (50 mg total) by mouth 3 (three) times daily as needed for moderate pain. 60 capsule 0  . metFORMIN (GLUCOPHAGE XR) 500 MG 24 hr tablet Take 1 tablet (500 mg total) by mouth daily. 90 tablet 1  . potassium chloride (K-DUR) 10 MEQ tablet Take 1 tablet (10 mEq total) by mouth daily. 30 tablet 2  . sulfacetamide-prednisoLONE (VASOCIDIN) 10-0.23 % ophthalmic solution Place 1 drop into both eyes daily as needed (allergies).     . zoster vaccine live, PF, (ZOSTAVAX) 91478 UNT/0.65ML injection Inject 19,400 Units into the skin once. Administer at pharmacy 1 each 0   No current facility-administered medications on file prior to visit.    ROS ROS otherwise unremarkable unless listed above.   Physical Examination: BP 122/64 mmHg  Pulse 93  Temp(Src) 98.3 F (36.8 C)  Resp 18  SpO2 94% Ideal Body Weight:    Physical Exam  Constitutional: She is oriented to person, place, and time. She appears well-developed and well-nourished. No distress.  HENT:  Head: Normocephalic and atraumatic.  Right Ear: External ear normal.  Left Ear: External ear normal.  Eyes: Conjunctivae and EOM are normal. Pupils are equal, round, and reactive to light.  Cardiovascular: Normal rate and regular rhythm.  Exam reveals no  friction rub.   No murmur heard. Pulmonary/Chest: Effort normal. No respiratory distress. She has no wheezes.  Neurological: She is alert and oriented to person, place, and time. She has normal strength. No cranial nerve deficit or sensory deficit. She displays a negative Romberg sign. Coordination and gait normal.  Upper extremity 4/5 strength.  Lower extremity 3/5 strength.   Skin: Skin is warm and dry. She is not diaphoretic. No erythema. No pallor.  Psychiatric:  She has a normal mood and affect. Her behavior is normal.   Results for orders placed or performed in visit on 09/10/15  POCT CBC  Result Value Ref Range   WBC 14.6 (A) 4.6 - 10.2 K/uL   Lymph, poc 2.2 0.6 - 3.4   POC LYMPH PERCENT 14.9 10 - 50 %L   MID (cbc) 0.2 0 - 0.9   POC MID % 1.7 0 - 12 %M   POC Granulocyte 12.2 (A) 2 - 6.9   Granulocyte percent 83.4 (A) 37 - 80 %G   RBC 3.99 (A) 4.04 - 5.48 M/uL   Hemoglobin 13.4 12.2 - 16.2 g/dL   HCT, POC 39.8 37.7 - 47.9 %   MCV 99.7 (A) 80 - 97 fL   MCH, POC 33.5 (A) 27 - 31.2 pg   MCHC 33.6 31.8 - 35.4 g/dL   RDW, POC 19.2 %   Platelet Count, POC 484 (A) 142 - 424 K/uL   MPV 6.2 0 - 99.8 fL  POCT glucose (manual entry)  Result Value Ref Range   POC Glucose 190 (A) 70 - 99 mg/dl  POCT glycosylated hemoglobin (Hb A1C)  Result Value Ref Range   Hemoglobin A1C 6.2      Assessment and Plan: Jacqeline Stayner Ure is a 70 y.o. female who is here today for cc of weakness, sob, and altered mental status. Diff dx. AMS secondary to infection (uti, pna, etc.).  CVA, TIA.   Please transport by EMS.   Weakness of right side of body - Plan: POCT CBC, POCT Microscopic Urinalysis ([facility]), POCT urinalysis dipstick, EKG 12-Lead, POCT glucose (manual entry), POCT glycosylated hemoglobin (Hb A1C)  SOB (shortness of breath) - Plan: POCT CBC, POCT Microscopic Urinalysis ([facility]), POCT urinalysis dipstick, EKG 12-Lead  Ivar Drape, PA-C [facility] 3/8/20171:28 PM  Ivar Drape, PA-C [facility] 09/10/2015 12:34 PM

## 2015-09-10 NOTE — H&P (Signed)
[facility] History and Physical  [name] [id number] DOB: 02-05-1946 DOA: 09/10/2015  Referring physician: Abigail Butts, PA-C PCP: Leandrew Koyanagi, MD   Chief Complaint: Weakness.  HPI: [name] is a 70 y.o. female with below past medical history who is coming via EMS, referred from [facility] for evaluation weakness.  Per patient's daughter, over the past 4-6 weeks she has been increasingly weak. About 2 weeks ago, the patient had a upper respiratory infection that was self-limited, but the patient's daughter reports that her weakness became worse. However the last 2-3 days, the patient has been significantly weak, has been having trouble ambulating and with her gait. She has been having decreased appetite, dyspnea on exertion, worsening of her left-sided hip pain.  When seen, the patient had just returned from the MRI suite. She was sleepy, but in no acute distress. The history was provided by her daughter.   Review of Systems:  History was provided by the patient's daughter  Past Medical History  Diagnosis Date  . Allergy   . Depression   . Asthma   . Hypertension   . Anxiety   . Breast cancer ([place])     S/P left mastectomy 2015  . Type II diabetes mellitus ([place])   . Migraine 1980's  . 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 Tubal ligation  1973  . Total hip arthroplasty Left   . Total hip arthroplasty Right   . Orif shoulder fracture Left 1964  . Rotator cuff repair Right   . Ankle fracture surgery Left 03/15/2000    with hardware  . Joint replacement    . Mastectomy w/ sentinel node biopsy Left 12/20/2013    Procedure: MASTECTOMY WITH SENTINEL LYMPH NODE BIOPSY;  Surgeon: Merrie Roof, MD;  Location: [facility];  Service: General;  Laterality: Left;  . Colonoscopy    . Application of wound vac  01/17/2014    w/chest wall debridement  . Mastectomy complete / simple w/ sentinel node biopsy Left    . Abdominal hysterectomy  1981    "fibroids"  . Application of wound vac Left 01/17/2014    Procedure: PLACEMENT OF VAC DRESSING;  Surgeon: Merrie Roof, MD;  Location: [place];  Service: General;  Laterality: Left;  . Wound debridement Left 01/17/2014    Procedure: DEBRIDEMENT OF CHEST WALL;  Surgeon: Merrie Roof, MD;  Location: [place];  Service: General;  Laterality: Left;   Social History:  reports that she quit smoking about 14 years ago. Her smoking use included Cigarettes. She has a 20 pack-year smoking history. She has never used smokeless tobacco. She reports that she drinks alcohol. She reports that she uses illicit drugs (Marijuana).  Allergies  Allergen Reactions  . Ace Inhibitors Other (See Comments)    Acute renal failure  . Angiotensin Receptor Blockers Other (See Comments)    Acute Renal Failure    Family History  Problem Relation Age of Onset  . Asthma Mother   . Stroke Mother   . Asthma Father   . Alcohol abuse Father   . Colon cancer Neg Hx     Prior to Admission medications   Medication Sig Start Date End Date Taking? Authorizing Provider  ALPRAZolam Duanne Moron) 1 MG tablet TAKE 1 TABLET BY MOUTH 3 TIMES DAILY AS NEEDED FOR SLEEP OR ANXIETY 08/20/15  Yes Leandrew Koyanagi, MD  amLODipine (  NORVASC) 10 MG tablet Take 1 tablet (10 mg total) by mouth daily. 08/20/15  Yes Leandrew Koyanagi, MD  anastrozole (ARIMIDEX) 1 MG tablet Take 1 tablet (1 mg total) by mouth daily. 03/11/15  Yes Wyatt Portela, MD  atenolol (TENORMIN) 50 MG tablet Take 2 tablets (100 mg total) by mouth daily. 08/20/15  Yes Leandrew Koyanagi, MD  Cholecalciferol (VITAMIN D PO) Take 1 capsule by mouth daily.   Yes Historical Provider, MD  hydrochlorothiazide (MICROZIDE) 12.5 MG capsule Take 1 capsule (12.5 mg total) by mouth daily. 08/20/15  Yes Leandrew Koyanagi, MD  indomethacin (INDOCIN) 50 MG capsule Take 1 capsule (50 mg total) by mouth 3 (three) times daily as needed for moderate pain. 11/23/14   Yes Mancel Bale, PA-C  metFORMIN (GLUCOPHAGE XR) 500 MG 24 hr tablet Take 1 tablet (500 mg total) by mouth daily. 08/20/15 08/19/16 Yes Leandrew Koyanagi, MD  potassium chloride (K-DUR) 10 MEQ tablet Take 1 tablet (10 mEq total) by mouth daily. 08/27/15  Yes Leandrew Koyanagi, MD  albuterol (PROVENTIL HFA;VENTOLIN HFA) 108 (90 BASE) MCG/ACT inhaler Inhale 1-2 puffs into the lungs every 4 (four) hours as needed for wheezing or shortness of breath.    Historical Provider, MD  glucose blood (ONE TOUCH ULTRA TEST) test strip USE AS INSTRUCTED each morning/Dispense with equal number of lancets Patient not taking: Reported on 09/10/2015 08/20/15   Leandrew Koyanagi, MD  sulfacetamide-prednisoLONE (VASOCIDIN) 10-0.23 % ophthalmic solution Place 1 drop into both eyes daily as needed (allergies).  10/18/13   Historical Provider, MD  zoster vaccine live, PF, (ZOSTAVAX) 16109 UNT/0.65ML injection Inject 19,400 Units into the skin once. Administer at pharmacy Patient not taking: Reported on 09/10/2015 02/26/15   Leandrew Koyanagi, MD   Physical Exam: Filed Vitals:   09/10/15 1730 09/10/15 1752 09/10/15 1900 09/10/15 1930  BP: 106/65 112/63 109/70 100/62  Pulse: 73 75 74 72  Temp:      TempSrc:      Resp: 23 24 27 28   SpO2: 97% 100% 100% 99%    Wt Readings from Last 3 Encounters:  08/20/15 84.369 kg (186 lb)  03/11/15 86.592 kg (190 lb 14.4 oz)  02/26/15 88.724 kg (195 lb 9.6 oz)    General:  Sleepy, in NAD. Appears calm and comfortable Eyes: PERRL, normal lids, irises & conjunctiva ENT: grossly normal hearing, lips & tongue Neck: no LAD, masses or thyromegaly Cardiovascular: RRR, no m/r/g. No LE edema. Telemetry: SR, no arrhythmias  Respiratory: Subtle wheezing and rhonchi bilaterally. No accessory muscle use. Abdomen: soft, ntnd Skin: no rash or induration seen on limited exam Musculoskeletal: grossly normal tone BUE/BLE Psychiatric: sleepy, speech Is slowed. Neurologic: Sleepy, but  arousable, oriented to self and place, partially oriented to time, generalized weakness, but grossly non-focal.          Labs on Admission:  Basic Metabolic Panel:  Recent Labs Lab 09/10/15 1638 09/10/15 2032  NA 136  --   K 2.8*  --   CL 87*  --   CO2 35*  --   GLUCOSE 169*  --   BUN 8  --   CREATININE 0.77  --   CALCIUM 9.6  --   MG  --  1.3*  PHOS  --  2.7   CBC:  Recent Labs Lab 09/10/15 1308 09/10/15 1638  WBC 14.6* 12.6*  NEUTROABS  --  10.0*  HGB 13.4 12.7  HCT 39.8 38.0  MCV 99.7* 99.7  PLT  --  404*    BNP (last 3 results)  Recent Labs  09/10/15 1638  BNP 66.5    CBG:  Recent Labs Lab 09/10/15 1857  GLUCAP 152*    Radiological Exams on Admission: Ct Head Wo Contrast  09/10/2015  CLINICAL DATA:  Right-sided leg weakness EXAM: CT HEAD WITHOUT CONTRAST TECHNIQUE: Contiguous axial images were obtained from the base of the skull through the vertex without intravenous contrast. COMPARISON:  None. FINDINGS: The ventricular system is slightly prominent as are the cortical sulci, indicative of mild atrophy. The septum is midline in position. No hemorrhage, mass lesion, or acute infarction is seen. On bone window images, no acute calvarial abnormality is noted. IMPRESSION: Mild atrophy.  No acute intracranial abnormality. Electronically Signed   By: Ivar Drape M.D.   On: 09/10/2015 17:05   Ct Angio Chest Pe W/cm &/or Wo Cm  09/10/2015  CLINICAL DATA:  Shortness of breath beginning at 1 a.m. last night. Initial encounter. EXAM: CT ANGIOGRAPHY CHEST WITH CONTRAST TECHNIQUE: Multidetector CT imaging of the chest was performed using the standard protocol during bolus administration of intravenous contrast. Multiplanar CT image reconstructions and MIPs were obtained to evaluate the vascular anatomy. CONTRAST:  100 ml OMNIPAQUE IOHEXOL 350 MG/ML SOLN COMPARISON:  Plain film the chest earlier today. FINDINGS: No pulmonary embolus is identified. No pleural or  pericardial effusion. Heart size is normal. No axillary, hilar or mediastinal lymphadenopathy. The right lobe of the thyroid is enlarged with some calcifications present. Calcific aortic atherosclerosis is noted. The lungs demonstrate some emphysematous change in the apices. Mild dependent atelectasis is noted in the right the lungs are otherwise clear. Incidentally imaged upper abdomen demonstrates no focal abnormality. No focal bony abnormality is seen. Review of the MIP images confirms the above findings. IMPRESSION: Negative for pulmonary embolus or acute disease. Atherosclerosis. Mild appearing emphysema. Electronically Signed   By: Inge Rise M.D.   On: 09/10/2015 18:55   Mr Brain Wo Contrast  09/10/2015  CLINICAL DATA:  Abnormal gait. Difficulty getting up, particularly with her right side. Difficulty with word-finding. Symptoms for at least 3 days. EXAM: MRI HEAD WITHOUT CONTRAST TECHNIQUE: Multiplanar, multiecho pulse sequences of the brain and surrounding structures were obtained without intravenous contrast. COMPARISON:  CT head without contrast from the same day. FINDINGS: The diffusion-weighted images demonstrate no evidence for acute or subacute infarction. No acute hemorrhage or mass lesion is present. Mild generalized atrophy and periventricular white matter changes are noted bilaterally. White matter changes extend into the brainstem. Ventricles are proportionate to the degree of atrophy. No significant extra-axial fluid collection is present. The internal auditory canals are within normal limits bilaterally. The cerebellum is normal. There is some abnormal signal within the non dominant right vertebral artery. This may represent slow or occluded flow. Flow is otherwise present within the major intracranial arteries. Globes and orbits are intact. The paranasal sinuses are clear. There is fluid in the mastoid air cells bilaterally. No obstructing nasopharyngeal lesion is evident. The  skullbase is normal.  Midline sagittal images are unremarkable. IMPRESSION: 1. Mild generalized atrophy and white matter disease is somewhat advanced for age. This is nonspecific, but likely reflects the sequela of chronic microvascular ischemia. 2. Abnormal signal suggesting slow or occluded flow within the right vertebral artery. 3. No other acute or subacute abnormality is present. Electronically Signed   By: San Morelle M.D.   On: 09/10/2015 20:40   Dg Chest Port 1 View  09/10/2015  CLINICAL  DATA:  Intermittent shortness of breath, weakness EXAM: PORTABLE CHEST 1 VIEW COMPARISON:  12/13/2013 FINDINGS: Cardiomediastinal silhouette is stable. No infiltrate or pleural effusion. No pulmonary edema. Degenerative changes bilateral AC joints. IMPRESSION: No active disease. Electronically Signed   By: Lahoma Crocker M.D.   On: 09/10/2015 15:32    EKG: Independently reviewed. Vent. rate 79 BPM PR interval 146 ms QRS duration 115 ms QT/QTc 399/457 ms P-R-T axes 57 -61 83 Sinus rhythm Incomplete left bundle branch block LVH with secondary repolarization abnormality  Assessment/Plan Principal problem:    Weakness Likely due to deconditioning and electrolyte abnormalities. Admit to telemetry/observation.  PT to evaluate and treat. Consult case management and social work. Check B12 level and TSH level. Check MRA of brain due to MRI of brain showing: Abnormal signal suggesting slow or occluded flow within the right vertebral artery.  Active Problems:   HTN (hypertension) Continue atenolol 100 mg by mouth daily Continue hydrochlorothiazide 12.5 mg by mouth daily. Supplement potassium and magnesium.. Decrease amlodipine to 5 mg by mouth daily. Monitor blood pressure periodically.    Asthma   History of tobacco use Continue supplemental oxygen. Bronchodilators as needed. Levaquin 500 mg by mouth daily. (This should cover positive leukocyte esterase as well)    Depression with  anxiety Continue alprazolam as needed. Patient to follow-up with her PCP to consider further treatment options.    Breast cancer of upper-inner quadrant of left female breast ([place]) Continue daily Arimidex.      Hypokalemia/hypomagnesemia Currently replacing. Follow-up levels as needed.   Type 2 diabetes. Hold metformin since the patient received IV contrast. CBG monitoring 3 times a day before meals.   Code Status: Full code. DVT Prophylaxis: Lovenox SQ. Family Communication:  Disposition Plan: Admit to observation for further evaluation.  Time spent: Over 70 minutes were spent in the process of his admission.  Reubin Milan [facility] Pager [phone].

## 2015-09-10 NOTE — ED Provider Notes (Signed)
S: [name] is a 70 y.o. female presents to the ED with generalized weakness onset several weeks ago but acutely worsening in the last 2-3 days. Daughter at bedside assists with history and states that her mother has just been lying in bed for the last several months but this has increased in the last several weeks as well.  She reports that over the last 2-3 days there is been increasing abnormal gait with patient requiring assistance for ambulation. Patient's daughter also reports that she appears out of breath after walking even just a few steps.  O:  General: Awake  HEENT: Atraumatic  Resp: Normal effort  Cardiac: RRR Abd: Nondistended, soft  MSK: No peripheral edema, full range of motion of the left hip with mild tenderness to palpation over the left trochanter Neuro: Mental Status:  Alert, oriented, thought content appropriate, able to give a coherent history. Speech fluent without evidence of aphasia. Able to follow 2 step commands without difficulty.  Cranial Nerves:  II:  Peripheral visual fields grossly normal, pupils equal, round, reactive to light III,IV, VI: ptosis not present, extra-ocular motions intact bilaterally  V,VII: smile symmetric, facial light touch sensation equal VIII: hearing grossly normal to voice  X: uvula elevates symmetrically  XI: bilateral shoulder shrug symmetric and strong XII: midline tongue extension without fassiculations Motor:  Normal tone. 5/5 in upper and lower extremities bilaterally including strong and equal grip strength and dorsiflexion/plantar flexion Sensory: Pinprick and light touch normal in all extremities.  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 Patient has had much time in bed. Positive d-dimer but negative CT angiogram chest - no PE. Patient with abnormal gait, but otherwise normal neurologic exam. CT scan negative. MRI pending.  Patient also found to be hypokalemic, repleted here in the emergency department.   BP 112/63 mmHg  Pulse 75  Temp(Src) 98 F (36.7 C) (Oral)  Resp 24  SpO2 100%   Pt was seen by Janetta Hora, PA-C and personally evaluated by myself with Ripley Fraise, MD supervising.      Karen Soho Matha Masse, PA-C 09/10/15 2055  Ripley Fraise, MD 09/11/15 [phone]

## 2015-09-10 NOTE — ED Provider Notes (Signed)
Patient seen/examined in the Emergency Department in conjunction with Midlevel Provider  Patient reports weakness/dizziness, worse with ambulation.   Exam : awake/alert, no arm/leg drift Plan: will need admission for further evaluation.  Pt is also found to be hypoxic upon ambulation    Ripley Fraise, MD 09/10/15 1745

## 2015-09-10 NOTE — ED Notes (Signed)
CBG 152  

## 2015-09-11 ENCOUNTER — Observation Stay (HOSPITAL_COMMUNITY): Payer: Medicare Other

## 2015-09-11 ENCOUNTER — Observation Stay (HOSPITAL_BASED_OUTPATIENT_CLINIC_OR_DEPARTMENT_OTHER): Payer: Medicare Other

## 2015-09-11 ENCOUNTER — Other Ambulatory Visit: Payer: Medicare Other

## 2015-09-11 ENCOUNTER — Ambulatory Visit: Payer: Medicare Other | Admitting: Oncology

## 2015-09-11 DIAGNOSIS — G9340 Encephalopathy, unspecified: Secondary | ICD-10-CM | POA: Diagnosis not present

## 2015-09-11 DIAGNOSIS — I959 Hypotension, unspecified: Secondary | ICD-10-CM

## 2015-09-11 DIAGNOSIS — E876 Hypokalemia: Secondary | ICD-10-CM

## 2015-09-11 DIAGNOSIS — R531 Weakness: Secondary | ICD-10-CM | POA: Diagnosis not present

## 2015-09-11 DIAGNOSIS — N390 Urinary tract infection, site not specified: Secondary | ICD-10-CM | POA: Diagnosis not present

## 2015-09-11 DIAGNOSIS — E119 Type 2 diabetes mellitus without complications: Secondary | ICD-10-CM | POA: Diagnosis not present

## 2015-09-11 DIAGNOSIS — F418 Other specified anxiety disorders: Secondary | ICD-10-CM

## 2015-09-11 LAB — CBC WITH DIFFERENTIAL/PLATELET
Basophils Absolute: 0 10*3/uL (ref 0.0–0.1)
Basophils Relative: 0 %
EOS PCT: 0 %
Eosinophils Absolute: 0 10*3/uL (ref 0.0–0.7)
HCT: 36.8 % (ref 36.0–46.0)
Hemoglobin: 11.9 g/dL — ABNORMAL LOW (ref 12.0–15.0)
LYMPHS ABS: 0.5 10*3/uL — AB (ref 0.7–4.0)
LYMPHS PCT: 5 %
MCH: 32.4 pg (ref 26.0–34.0)
MCHC: 32.3 g/dL (ref 30.0–36.0)
MCV: 100.3 fL — AB (ref 78.0–100.0)
MONOS PCT: 3 %
Monocytes Absolute: 0.3 10*3/uL (ref 0.1–1.0)
Neutro Abs: 10.3 10*3/uL — ABNORMAL HIGH (ref 1.7–7.7)
Neutrophils Relative %: 92 %
PLATELETS: 417 10*3/uL — AB (ref 150–400)
RBC: 3.67 MIL/uL — AB (ref 3.87–5.11)
RDW: 17.3 % — ABNORMAL HIGH (ref 11.5–15.5)
WBC: 11.1 10*3/uL — AB (ref 4.0–10.5)

## 2015-09-11 LAB — BLOOD GAS, ARTERIAL
Acid-Base Excess: 9.6 mmol/L — ABNORMAL HIGH (ref 0.0–2.0)
BICARBONATE: 34.2 meq/L — AB (ref 20.0–24.0)
DRAWN BY: 227661
O2 Content: 3 L/min
O2 Saturation: 90.7 %
PCO2 ART: 52.5 mmHg — AB (ref 35.0–45.0)
PH ART: 7.43 (ref 7.350–7.450)
Patient temperature: 98.6
TCO2: 35.8 mmol/L (ref 0–100)
pO2, Arterial: 67 mmHg — ABNORMAL LOW (ref 80.0–100.0)

## 2015-09-11 LAB — GLUCOSE, CAPILLARY
GLUCOSE-CAPILLARY: 212 mg/dL — AB (ref 65–99)
GLUCOSE-CAPILLARY: 287 mg/dL — AB (ref 65–99)
Glucose-Capillary: 184 mg/dL — ABNORMAL HIGH (ref 65–99)
Glucose-Capillary: 215 mg/dL — ABNORMAL HIGH (ref 65–99)

## 2015-09-11 LAB — COMPREHENSIVE METABOLIC PANEL
ALBUMIN: 2.3 g/dL — AB (ref 3.5–5.0)
ALK PHOS: 65 U/L (ref 38–126)
ALT: 14 U/L (ref 14–54)
AST: 19 U/L (ref 15–41)
Anion gap: 13 (ref 5–15)
BUN: 6 mg/dL (ref 6–20)
CALCIUM: 9.2 mg/dL (ref 8.9–10.3)
CO2: 32 mmol/L (ref 22–32)
CREATININE: 0.69 mg/dL (ref 0.44–1.00)
Chloride: 92 mmol/L — ABNORMAL LOW (ref 101–111)
GFR calc Af Amer: >60 mL/min (ref >60)
GFR calc non Af Amer: >60 mL/min (ref >60)
GLUCOSE: 207 mg/dL — AB (ref 65–99)
Potassium: 3.3 mmol/L — ABNORMAL LOW (ref 3.5–5.1)
Sodium: 137 mmol/L (ref 135–145)
Total Bilirubin: 0.9 mg/dL (ref 0.3–1.2)
Total Protein: 6.7 g/dL (ref 6.5–8.1)

## 2015-09-11 LAB — ECHOCARDIOGRAM COMPLETE
E decel time: 229 msec
FS: 46 % — AB (ref 28–44)
IVS/LV PW RATIO, ED: 1.09
LDCA: 3.46 cm2
LEFT ATRIUM END SYS DIAM: 39 cm
LV PW d: 11 mm — AB (ref 0.6–1.1)

## 2015-09-11 LAB — TSH: TSH: 1.013 u[IU]/mL (ref 0.350–4.500)

## 2015-09-11 LAB — VITAMIN B12: Vitamin B-12: 297 pg/mL (ref 180–914)

## 2015-09-11 LAB — AMMONIA: Ammonia: 16 umol/L (ref 9–35)

## 2015-09-11 MED ORDER — SODIUM CHLORIDE 0.9 % IV BOLUS (SEPSIS)
500.0000 mL | Freq: Once | INTRAVENOUS | Status: AC
  Administered 2015-09-11: 500 mL via INTRAVENOUS

## 2015-09-11 MED ORDER — IPRATROPIUM-ALBUTEROL 0.5-2.5 (3) MG/3ML IN SOLN
3.0000 mL | Freq: Two times a day (BID) | RESPIRATORY_TRACT | Status: DC
  Filled 2015-09-11: qty 3

## 2015-09-11 MED ORDER — CYANOCOBALAMIN 1000 MCG/ML IJ SOLN
1000.0000 ug | Freq: Once | INTRAMUSCULAR | Status: AC
  Administered 2015-09-11: 1000 ug via INTRAMUSCULAR
  Filled 2015-09-11: qty 1

## 2015-09-11 MED ORDER — POTASSIUM CHLORIDE CRYS ER 20 MEQ PO TBCR
40.0000 meq | EXTENDED_RELEASE_TABLET | Freq: Once | ORAL | Status: AC
  Administered 2015-09-11: 40 meq via ORAL
  Filled 2015-09-11: qty 2

## 2015-09-11 NOTE — Progress Notes (Signed)
VASCULAR LAB PRELIMINARY  PRELIMINARY  PRELIMINARY  PRELIMINARY     Carotid duplex has been completed.   Bilateral:  1-39% ICA stenosis.  Vertebral artery flow is antegrade.     Kelsee Preslar, RVT, RDMS 09/11/2015, 9:32 AM   .

## 2015-09-11 NOTE — Progress Notes (Signed)
Pt was admitted from ED per stretcher accompanied by a tech and pt duaghter, self introduce to pt and family, pt ID bracelet checked, fall prevention plan discussed with pt verbalised understanding, admission package given to pt call light and phone within reach and pt demonstrated how to use it, prescribed treatment started and pt made comfortable in bed with bed alarm turned on

## 2015-09-11 NOTE — Evaluation (Addendum)
Physical Therapy Evaluation Patient Details Name: [name] MRN: [id number] DOB: Jun 29, 1946 Today's Date: 09/11/2015   History of Present Illness  Pt adm with weakness. PMH - breast CA, HTN, DM, bil THR, rotator cuff repair  Clinical Impression  Pt admitted with above diagnosis and presents to PT with functional limitations due to deficits listed below (See PT problem list). Pt needs skilled PT to maximize independence and safety to allow discharge hopefully to home if she can progress quickly and family can provide a few days of incr support. If not able to mobilize well enough to be home alone in a few days may have to look at [facility].      Follow Up Recommendations Home health PT;Supervision/Assistance - 24 hour (If family can provide initial 2-3 days supervision)    Equipment Recommendations  Rolling walker with 5" wheels    Recommendations for Other Services       Precautions / Restrictions Precautions Precautions: Fall Restrictions Weight Bearing Restrictions: No      Mobility  Bed Mobility Overal bed mobility: Needs Assistance Bed Mobility: Supine to Sit;Sit to Supine     Supine to sit: Min assist Sit to supine: Min guard   General bed mobility comments: Assist to elevate trunk.  Transfers Overall transfer level: Needs assistance Equipment used: Rolling walker (2 wheeled) Transfers: Sit to/from Stand Sit to Stand: Min assist         General transfer comment: Verbal cues for hand placement and assist to bring hips up.  Ambulation/Gait Ambulation/Gait assistance: Min guard Ambulation Distance (Feet): 12 Feet (x 2) Assistive device: Rolling walker (2 wheeled) Gait Pattern/deviations: Step-through pattern;Decreased step length - right;Decreased step length - left Gait velocity: decr Gait velocity interpretation: Below normal speed for age/gender General Gait Details: Assist for balance. Pt with incr stability as distance increased. SpO2 86-87% on RA with  amb  Stairs            Wheelchair Mobility    Modified Rankin (Stroke Patients Only)       Balance Overall balance assessment: Needs assistance Sitting-balance support: No upper extremity supported;Feet supported Sitting balance-Leahy Scale: Good     Standing balance support: Single extremity supported;During functional activity Standing balance-Leahy Scale: Poor Standing balance comment: upper extremity support.                             Pertinent Vitals/Pain Pain Assessment: No/denies pain    Home Living Family/patient expects to be discharged to:: Private residence Living Arrangements: Children;Other relatives (daughter and granddaughter) Available Help at Discharge: Family;Available PRN/intermittently   Home Access: Stairs to enter Entrance Stairs-Rails: Right Entrance Stairs-Number of Steps: 2-3 Home Layout: Two level;Able to live on main level with bedroom/bathroom Home Equipment: Kasandra Knudsen - single point      Prior Function Level of Independence: Independent with assistive device(s)         Comments: Occasional use of cane     Hand Dominance        Extremity/Trunk Assessment   Upper Extremity Assessment: Generalized weakness           Lower Extremity Assessment: Generalized weakness         Communication   Communication: No difficulties  Cognition Arousal/Alertness: Awake/alert Behavior During Therapy: WFL for tasks assessed/performed Overall Cognitive Status: Within Functional Limits for tasks assessed  General Comments      Exercises        Assessment/Plan    PT Assessment Patient needs continued PT services  PT Diagnosis Difficulty walking;Generalized weakness   PT Problem List Decreased strength;Decreased activity tolerance;Decreased balance;Decreased mobility;Decreased knowledge of use of DME  PT Treatment Interventions DME instruction;Gait training;Stair training;Functional  mobility training;Therapeutic activities;Therapeutic exercise;Balance training;Patient/family education   PT Goals (Current goals can be found in the Care Plan section) Acute Rehab PT Goals Patient Stated Goal: Return home PT Goal Formulation: With patient Time For Goal Achievement: 09/18/15 Potential to Achieve Goals: Good    Frequency Min 3X/week   Barriers to discharge Decreased caregiver support Daughter works and granddaughter goes to school    Co-evaluation               End of Session Equipment Utilized During Treatment: Gait belt Activity Tolerance: Patient tolerated treatment well Patient left: in bed;with call bell/phone within reach;with bed alarm set Nurse Communication: Mobility status    Functional Assessment Tool Used: clinical judgement Functional Limitation: Mobility: Walking and moving around Mobility: Walking and Moving Around Current Status [phone]): At least 20 percent but less than 40 percent impaired, limited or restricted Mobility: Walking and Moving Around Goal Status [phone]): At least 1 percent but less than 20 percent impaired, limited or restricted    Time: [id number] PT Time Calculation (min) (ACUTE ONLY): 19 min   Charges:   PT Evaluation $PT Eval Moderate Complexity: 1 Procedure     PT G Codes:   PT G-Codes **NOT FOR INPATIENT CLASS** Functional Assessment Tool Used: clinical judgement Functional Limitation: Mobility: Walking and moving around Mobility: Walking and Moving Around Current Status [id number]): At least 20 percent but less than 40 percent impaired, limited or restricted Mobility: Walking and Moving Around Goal Status [phone]): At least 1 percent but less than 20 percent impaired, limited or restricted    [facility] 09/11/2015, 6:17 PM [facility] PT [phone]

## 2015-09-11 NOTE — Progress Notes (Signed)
EEG completed; results pending.    

## 2015-09-11 NOTE — Progress Notes (Addendum)
Dr. Maryland Pink made aware of BP 94/49 HR 90. Orders received.   12:12 PM MD paged to make aware ABG lab has resulted

## 2015-09-11 NOTE — Care Management Obs Status (Signed)
[place] NOTIFICATION   Patient Details  Name: [name] MRN: [id number] Date of Birth: January 16, 1946   Medicare Observation Status Notification Given:  Yes    Sharin Mons, RN 09/11/2015, 9:22 AM

## 2015-09-11 NOTE — Progress Notes (Signed)
  Echocardiogram 2D Echocardiogram has been performed.  Gustavo Dispenza 09/11/2015, 10:12 AM

## 2015-09-11 NOTE — Progress Notes (Signed)
[facility] PROGRESS NOTE  [name] [id number] DOB: 04/03/1946 DOA: 09/10/2015  PCP: Leandrew Koyanagi, MD  Brief HPI: 70 year old African-American female with past medical history of depression, hypertension, breast cancer, diabetes, was sent over from urgent care for evaluation of weakness. Apparently, patient has had an upper respiratory infection for the past 2-3 weeks. Over the past 2-3 days she has gotten increasingly weak.  Past medical history:  Past Medical History  Diagnosis Date  . Allergy   . Depression   . Asthma   . Hypertension   . Anxiety   . Breast cancer ([place])     S/P left mastectomy 2015  . Type II diabetes mellitus ([place])   . Migraine 1980's  . 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 There was mild concentric hypertrophy. Systolic function was normal. The estimated ejection fraction was in the range of 50% to 55%. Wall motion was normal; there were no regional wall motion abnormalities. Doppler parameters are consistent with abnormal left ventricular relaxation (grade 1 diastolic dysfunction). - Left atrium: The atrium was mildly dilated.  Carotid Doppler No significant stenosis  EEG is pending  Antibiotics: None  Subjective: Patient is somewhat lethargic this morning. Moving all extremities. Follows commands but unable to maintain conversation.  Objective: Vital Signs  Filed Vitals:   09/11/15 0051 09/11/15 0306 09/11/15 0601 09/11/15 0707  BP: 103/62 113/64 104/57   Pulse: 77 70 68   Temp: 99 F (37.2 C) 98.4 F (36.9 C) 98.3 F (36.8 C)   TempSrc: Oral Axillary Axillary   Resp: 18 18 15    SpO2: 97% 98% 95% 94%    Intake/Output Summary (Last 24 hours) at 09/11/15 1039 Last data filed at 09/11/15 0707  Gross per 24 hour  Intake    860 ml  Output      0 ml  Net    860 ml   There were no vitals filed for this  visit.  General appearance: Lethargic but arousable. No distress Resp: Diminished air entry at the bases. No crackles, wheezing or rhonchi. Cardio: regular rate and rhythm, S1, S2 normal, no murmur, click, rub or gallop GI: soft, non-tender; bowel sounds normal; no masses,  no organomegaly Extremities: extremities normal, atraumatic, no cyanosis or edema Neurologic: Lethargic but arousable. No obvious facial asymmetry. Moving all her extremities. No obvious focal deficits.  Lab Results:  Basic Metabolic Panel:  Recent Labs Lab 09/10/15 1638 09/10/15 2032 09/11/15 0555  NA 136  --  137  K 2.8*  --  3.3*  CL 87*  --  92*  CO2 35*  --  32  GLUCOSE 169*  --  207*  BUN 8  --  6  CREATININE 0.77  --  0.69  CALCIUM 9.6  --  9.2  MG  --  1.3*  --   PHOS  --  2.7  --    Liver Function Tests:  Recent Labs Lab 09/11/15 0555  AST 19  ALT 14  ALKPHOS 65  BILITOT 0.9  PROT 6.7  ALBUMIN 2.3*   CBC:  Recent Labs Lab 09/10/15 1308 09/10/15 1638 09/11/15 0555  WBC 14.6* 12.6* 11.1*  NEUTROABS  --  10.0* 10.3*  HGB 13.4 12.7 11.9*  HCT 39.8 38.0 36.8  MCV 99.7* 99.7 100.3*  PLT  --  404* 417*   BNP (last 3 results)  Recent Labs  09/10/15 1638  BNP 66.5    CBG:  Recent Labs Lab 09/10/15 1857 09/10/15 2349 09/11/15 0752  GLUCAP 152* 135* 212*    No results found for this or any previous visit (from the past 240 hour(s)).    Studies/Results: Ct Head Wo Contrast  09/10/2015  CLINICAL DATA:  Right-sided leg weakness EXAM: CT HEAD WITHOUT CONTRAST TECHNIQUE: Contiguous axial images were obtained from the base of the skull through the vertex without intravenous contrast. COMPARISON:  None. FINDINGS: The ventricular system is slightly prominent as are the cortical sulci, indicative of mild atrophy. The septum is midline in position. No hemorrhage, mass lesion, or acute infarction is seen. On bone window images, no acute calvarial abnormality is noted. IMPRESSION:  Mild atrophy.  No acute intracranial abnormality. Electronically Signed   By: Ivar Drape M.D.   On: 09/10/2015 17:05   Ct Angio Chest Pe W/cm &/or Wo Cm  09/10/2015  CLINICAL DATA:  Shortness of breath beginning at 1 a.m. last night. Initial encounter. EXAM: CT ANGIOGRAPHY CHEST WITH CONTRAST TECHNIQUE: Multidetector CT imaging of the chest was performed using the standard protocol during bolus administration of intravenous contrast. Multiplanar CT image reconstructions and MIPs were obtained to evaluate the vascular anatomy. CONTRAST:  100 ml OMNIPAQUE IOHEXOL 350 MG/ML SOLN COMPARISON:  Plain film the chest earlier today. FINDINGS: No pulmonary embolus is identified. No pleural or pericardial effusion. Heart size is normal. No axillary, hilar or mediastinal lymphadenopathy. The right lobe of the thyroid is enlarged with some calcifications present. Calcific aortic atherosclerosis is noted. The lungs demonstrate some emphysematous change in the apices. Mild dependent atelectasis is noted in the right the lungs are otherwise clear. Incidentally imaged upper abdomen demonstrates no focal abnormality. No focal bony abnormality is seen. Review of the MIP images confirms the above findings. IMPRESSION: Negative for pulmonary embolus or acute disease. Atherosclerosis. Mild appearing emphysema. Electronically Signed   By: Inge Rise M.D.   On: 09/10/2015 18:55   Mr Virgel Paling Wo Contrast  09/11/2015  CLINICAL DATA:  70 year old female with abnormal distal right vertebral artery flow void on brain MRI yesterday done for abnormal gait. Initial encounter. EXAM: MRA HEAD WITHOUT CONTRAST TECHNIQUE: Angiographic images of the Circle of Willis were obtained using MRA technique without intravenous contrast. COMPARISON:  Brain MRI 09/10/2015, head CT without contrast 09/10/2015. FINDINGS: Antegrade flow in the posterior circulation. The distal right vertebral artery is patent, and though mildly irregular there is no  stenosis identified to the suggested right V4 segment abnormality. Distal left vertebral artery also mildly irregular, and mildly dominant. Normal left PICA origin. Patent vertebrobasilar junction. Mild basilar artery tortuosity without stenosis. SCA and PCA origins are normal. Posterior communicating arteries are diminutive or absent. Bilateral PCA branches are within normal limits. Tortuous distal cervical right ICA. Antegrade flow in both ICA siphons. Bilateral cavernous segment irregularity without ICA stenosis. Patent carotid termini. Ophthalmic artery origins are within normal limits. Normal MCA and ACA origins. Anterior communicating artery and visualized ACA branches are within normal limits. MCA M1 segments and bifurcations are within normal limits. Visualized bilateral MCA branches are within normal limits. IMPRESSION: Evidence of intracranial atherosclerosis, including in the distal right vertebral artery, but no associated arterial stenosis or decreased intracranial arterial flow. Otherwise negative intracranial MRA. Electronically Signed   By: Genevie Ann M.D.   On: 09/11/2015 09:12   Mr Brain Wo Contrast  09/10/2015  CLINICAL DATA:  Abnormal gait. Difficulty getting up, particularly with  her right side. Difficulty with word-finding. Symptoms for at least 3 days. EXAM: MRI HEAD WITHOUT CONTRAST TECHNIQUE: Multiplanar, multiecho pulse sequences of the brain and surrounding structures were obtained without intravenous contrast. COMPARISON:  CT head without contrast from the same day. FINDINGS: The diffusion-weighted images demonstrate no evidence for acute or subacute infarction. No acute hemorrhage or mass lesion is present. Mild generalized atrophy and periventricular white matter changes are noted bilaterally. White matter changes extend into the brainstem. Ventricles are proportionate to the degree of atrophy. No significant extra-axial fluid collection is present. The internal auditory canals are  within normal limits bilaterally. The cerebellum is normal. There is some abnormal signal within the non dominant right vertebral artery. This may represent slow or occluded flow. Flow is otherwise present within the major intracranial arteries. Globes and orbits are intact. The paranasal sinuses are clear. There is fluid in the mastoid air cells bilaterally. No obstructing nasopharyngeal lesion is evident. The skullbase is normal.  Midline sagittal images are unremarkable. IMPRESSION: 1. Mild generalized atrophy and white matter disease is somewhat advanced for age. This is nonspecific, but likely reflects the sequela of chronic microvascular ischemia. 2. Abnormal signal suggesting slow or occluded flow within the right vertebral artery. 3. No other acute or subacute abnormality is present. Electronically Signed   By: San Morelle M.D.   On: 09/10/2015 20:40   Dg Chest Port 1 View  09/10/2015  CLINICAL DATA:  Intermittent shortness of breath, weakness EXAM: PORTABLE CHEST 1 VIEW COMPARISON:  12/13/2013 FINDINGS: Cardiomediastinal silhouette is stable. No infiltrate or pleural effusion. No pulmonary edema. Degenerative changes bilateral AC joints. IMPRESSION: No active disease. Electronically Signed   By: Lahoma Crocker M.D.   On: 09/10/2015 15:32   Dg Hip Unilat With Pelvis 2-3 Views Left  09/11/2015  CLINICAL DATA:  Anterior left hip pain.  No injury. EXAM: DG HIP (WITH OR WITHOUT PELVIS) 2-3V LEFT COMPARISON:  None. FINDINGS: Patient has bilateral total hip arthroplasties. Both appear well seated and aligned. There is no evidence of loosening of the left hip prosthesis. No fracture.  No bone lesion. SI joints and symphysis pubis are normally spaced and aligned. Soft tissues are unremarkable. IMPRESSION: 1. No fracture or acute finding. 2. No evidence of loosening or malalignment of the left hip prosthesis. Electronically Signed   By: Lajean Manes M.D.   On: 09/11/2015 08:30    Medications:    Scheduled: . anastrozole  1 mg Oral Daily  . enoxaparin (LOVENOX) injection  40 mg Subcutaneous Daily  . ipratropium-albuterol  3 mL Nebulization Q6H  . levofloxacin  500 mg Oral QHS  . potassium chloride  10 mEq Oral BID   Continuous:  [id number], ALPRAZolam, olopatadine, ondansetron **OR** ondansetron (ZOFRAN) IV  Assessment/Plan:  Principal Problem:   Weakness Active Problems:   HTN (hypertension)   History of tobacco use   Depression with anxiety   Breast cancer of upper-inner quadrant of left female breast (HCC)   Hypokalemia   COPD exacerbation (HCC)   Type 2 diabetes mellitus (HCC)   Hypomagnesemia    Generalized Weakness Etiology remains unclear. MRI brain did not show any stroke. Reason for patient's lethargy is not clear. ABG was ordered which shows a normal pH with slightly elevated PCO2, which could be her baseline. Blood pressure was slightly on the lower side, so she was given a fluid bolus with improvement in BP. Proceed with EEG for now. Avoid sedative agents. B-12 level 297. She will benefit from supplementation.  TSH is normal. MRA does not show any vertebral artery stenosis. Cortisol level will be checked. No significant carotid artery stenosis. Echocardiogram does not show any concerning findings.  Essential hypertension Blood pressure borderline low. Hold her antihypertensive medications for now. Fluid bolus has been ordered.   History of COPD  Patient apparently had upper respiratory tract infection over the past 2-3 weeks. Continue nebulizer treatments. She was given a dose of Solu-Medrol in the ED. No need to continue at this point in time. Continue to monitor closely. CT scan of the chest did not show PE.  Abnormal UA/possible UTI Continue Levaquin. Cultures are pending.  Depression with anxiety Patient to follow-up with her PCP to consider further treatment options. Hold Xanax due to excessive sedation.  Breast cancer of upper-inner quadrant of  left female breast Continue daily Arimidex.   Hypokalemia/hypomagnesemia Potassium and Magnesium repleted. Repeat levels tomorrow morning.   Type 2 diabetes Hold metformin since the patient received IV contrast. Sliding scale coverage.   DVT Prophylaxis: Lovenox    Code Status: Full code  Family Communication: No family at bedside  Disposition Plan: Await EEG     [facility] Pager [phone] 09/11/2015, 10:39 AM  If 7PM-7AM, please contact night-coverage at [email or web address], password [facility]

## 2015-09-11 NOTE — Procedures (Addendum)
ELECTROENCEPHALOGRAM REPORT  Patient: [name]       Room #: [id number] EEG No. ID: [id number] Age: 70 y.o.        Sex: female Referring Physician: Curly Rim Report Date:  09/11/2015        Interpreting Physician: Anthony Sar  History: [name] is an 70 y.o. female with history of diabetes mellitus, hypertension, depression and anxiety admitted for evaluation of progressive weakness and fatigue over several weeks.  Indications for study:   Rule out encephalopathy.  Technique: This is an 18 channel routine scalp EEG performed at the bedside with bipolar and monopolar montages arranged in accordance to the international 10/20 system of electrode placement.   Description: This EEG recording was performed during wakefulness. Predominant background activity consisted of low amplitude diffuse 1-2 Hz delta activity with superimposed moderate amplitude 6-7 Hz theta activity which was most prominent posteriorly. Photic stimulation was not performed. No epileptiform discharges were recorded.  Interpretation: This EEG is abnormal with mild to moderate generalized nonspecific continuous slowing of cerebral activity, which can be seen with metabolic and toxic encephalopathies, as well as with degenerative central nervous system disorders. No evidence of an epileptic disorder was demonstrated.   [name] M.D. [facility] [phone]

## 2015-09-12 DIAGNOSIS — N390 Urinary tract infection, site not specified: Secondary | ICD-10-CM

## 2015-09-12 LAB — CBC
HEMATOCRIT: 34.9 % — AB (ref 36.0–46.0)
HEMOGLOBIN: 11 g/dL — AB (ref 12.0–15.0)
MCH: 31.7 pg (ref 26.0–34.0)
MCHC: 31.5 g/dL (ref 30.0–36.0)
MCV: 100.6 fL — ABNORMAL HIGH (ref 78.0–100.0)
PLATELETS: 444 10*3/uL — AB (ref 150–400)
RBC: 3.47 MIL/uL — AB (ref 3.87–5.11)
RDW: 17.1 % — AB (ref 11.5–15.5)
WBC: 7 10*3/uL (ref 4.0–10.5)

## 2015-09-12 LAB — BASIC METABOLIC PANEL
Anion gap: 11 (ref 5–15)
BUN: 6 mg/dL (ref 6–20)
CALCIUM: 9.1 mg/dL (ref 8.9–10.3)
CO2: 32 mmol/L (ref 22–32)
CREATININE: 0.64 mg/dL (ref 0.44–1.00)
Chloride: 99 mmol/L — ABNORMAL LOW (ref 101–111)
Glucose, Bld: 129 mg/dL — ABNORMAL HIGH (ref 65–99)
Potassium: 3.4 mmol/L — ABNORMAL LOW (ref 3.5–5.1)
SODIUM: 142 mmol/L (ref 135–145)

## 2015-09-12 LAB — GLUCOSE, CAPILLARY
Glucose-Capillary: 110 mg/dL — ABNORMAL HIGH (ref 65–99)
Glucose-Capillary: 179 mg/dL — ABNORMAL HIGH (ref 65–99)

## 2015-09-12 LAB — MAGNESIUM: MAGNESIUM: 1.6 mg/dL — AB (ref 1.7–2.4)

## 2015-09-12 LAB — CORTISOL-AM, BLOOD: CORTISOL - AM: 14.2 ug/dL (ref 6.7–22.6)

## 2015-09-12 MED ORDER — ATENOLOL 50 MG PO TABS: 100.0000 mg | ORAL_TABLET | Freq: Every day | ORAL | Status: DC

## 2015-09-12 MED ORDER — MAGNESIUM OXIDE 400 MG PO TABS
400.0000 mg | ORAL_TABLET | Freq: Two times a day (BID) | ORAL | Status: DC
Start: 2015-09-12 — End: 2017-12-14

## 2015-09-12 MED ORDER — VITAMIN B-12 1000 MCG PO TABS: 1000.0000 ug | ORAL_TABLET | Freq: Every day | ORAL | Status: DC

## 2015-09-12 MED ORDER — MAGNESIUM SULFATE 2 GM/50ML IV SOLN
2.0000 g | Freq: Once | INTRAVENOUS | Status: AC
  Administered 2015-09-12: 2 g via INTRAVENOUS
  Filled 2015-09-12: qty 50

## 2015-09-12 MED ORDER — POTASSIUM CHLORIDE CRYS ER 20 MEQ PO TBCR
40.0000 meq | EXTENDED_RELEASE_TABLET | Freq: Once | ORAL | Status: AC
  Administered 2015-09-12: 40 meq via ORAL
  Filled 2015-09-12: qty 2

## 2015-09-12 MED ORDER — ALPRAZOLAM 1 MG PO TABS: ORAL_TABLET | ORAL | Status: DC

## 2015-09-12 MED ORDER — LEVOFLOXACIN 500 MG PO TABS: 500.0000 mg | ORAL_TABLET | Freq: Every day | ORAL | Status: DC

## 2015-09-12 NOTE — Progress Notes (Signed)
Physical Therapy Treatment Patient Details Name: [name] MRN: [id number] DOB: July 08, 1945 Today's Date: 09/12/2015    History of Present Illness Pt adm with weakness. PMH - breast CA, HTN, DM, bil THR, rotator cuff repair    PT Comments    Making great progress today. Tolerated 240 feet of ambulation on room air with SpO2 of 89-90% throughout, minimal dyspnea, returns to 94% with seated break. No overt loss of balance, using RW lightly for support. Adequate for D/c from PT standpoint when medially ready.  Follow Up Recommendations  Home health PT;Supervision for mobility/OOB     Equipment Recommendations  Rolling walker with 5" wheels    Recommendations for Other Services       Precautions / Restrictions Precautions Precautions: Fall Restrictions Weight Bearing Restrictions: No    Mobility  Bed Mobility Overal bed mobility: Modified Independent             General bed mobility comments: extra time, no assist needed  Transfers Overall transfer level: Needs assistance Equipment used: Rolling walker (2 wheeled) Transfers: Sit to/from Stand Sit to Stand: Supervision         General transfer comment: Slow to rise, min sway, able to perform without physical assist.  Ambulation/Gait Ambulation/Gait assistance: Supervision Ambulation Distance (Feet): 240 Feet Assistive device: Rolling walker (2 wheeled) Gait Pattern/deviations: Step-through pattern;Decreased stride length;Trunk flexed Gait velocity: decr Gait velocity interpretation: Below normal speed for age/gender General Gait Details: SpO2 maintained 89-90% on room air today. VC for pursed lip breathing with minimal dyspnea noted. No overt loss of balance. VC for walker placement for proximity and upright posture. States she feels better today.    Stairs            Wheelchair Mobility    Modified Rankin (Stroke Patients Only)       Balance                                     Cognition Arousal/Alertness: Awake/alert Behavior During Therapy: WFL for tasks assessed/performed Overall Cognitive Status: Within Functional Limits for tasks assessed                      Exercises      General Comments General comments (skin integrity, edema, etc.): SpOw back to 94% on room air after sitting      Pertinent Vitals/Pain Pain Assessment: No/denies pain    Home Living                      Prior Function            PT Goals (current goals can now be found in the care plan section) Acute Rehab PT Goals Patient Stated Goal: Return home PT Goal Formulation: With patient Time For Goal Achievement: 09/18/15 Potential to Achieve Goals: Good Progress towards PT goals: Progressing toward goals    Frequency  Min 3X/week    PT Plan Current plan remains appropriate    Co-evaluation             End of Session Equipment Utilized During Treatment: Gait belt Activity Tolerance: Patient tolerated treatment well Patient left: in bed;with call bell/phone within reach;with bed alarm set     Time: [id number] PT Time Calculation (min) (ACUTE ONLY): 11 min  Charges:  $Gait Training: 8-22 mins            Burnis Kaser  [place], [place]

## 2015-09-12 NOTE — Discharge Instructions (Signed)

## 2015-09-12 NOTE — Progress Notes (Signed)
Sherril Cong Saddler to be D/C'd Home per MD order.  Discussed with the patient and all questions fully answered.  VSS, Skin clean, dry and intact without evidence of skin break down, no evidence of skin tears noted. IV catheter discontinued intact. Site without signs and symptoms of complications. Dressing and pressure applied.  An After Visit Summary was printed and given to the patient. Patient received prescription.  D/c education completed with patient/family including follow up instructions, medication list, d/c activities limitations if indicated, with other d/c instructions as indicated by MD - patient able to verbalize understanding, all questions fully answered.   Patient instructed to return to ED, call 911, or call MD for any changes in condition.   Patient to be escorted via [place], and D/C home via private auto.  L'ESPERANCE, Evadean Sproule C 09/12/2015 1:55 PM

## 2015-09-12 NOTE — Discharge Summary (Signed)
[facility]  Physician Discharge Summary   Patient ID: [name] MRN: [id number] DOB/AGE: 12-26-1945 70 y.o.  Admit date: 09/10/2015 Discharge date: 09/12/2015  PCP: Leandrew Koyanagi, MD  DISCHARGE DIAGNOSES:  Principal Problem:   Weakness Active Problems:   HTN (hypertension)   History of tobacco use   Depression with anxiety   Breast cancer of upper-inner quadrant of left female breast (HCC)   Hypokalemia   COPD exacerbation (HCC)   Type 2 diabetes mellitus (HCC)   Hypomagnesemia   RECOMMENDATIONS FOR OUTPATIENT FOLLOW UP: 1. Needs close outpatient follow-up for blood pressure check and advise regarding resumption of antihypertensives 2. Vitamin B-12 supplementation   DISCHARGE CONDITION: fair  Diet recommendation: As before  INITIAL HISTORY: 70 year old African-American female with past medical history of depression, hypertension, breast cancer, diabetes, was sent over from urgent care for evaluation of weakness. Apparently, patient has had an upper respiratory infection for the past 2-3 weeks. Over the past 2-3 days she has gotten increasingly weak.  Consultations:  None  Procedures: EEG This EEG is abnormal with mild to moderate generalized nonspecific continuous slowing of cerebral activity, which can be seen with metabolic and toxic encephalopathies, as well as with degenerative central nervous system disorders. No evidence of an epileptic disorder was demonstrated.  Echocardiogram Study Conclusions - Left ventricle: The cavity size was normal. There was mild concentric hypertrophy. Systolic function was normal. The estimated ejection fraction was in the range of 50% to 55%. Wall motion was normal; there were no regional wall motion abnormalities. Doppler parameters are consistent with abnormal left ventricular relaxation (grade 1 diastolic dysfunction). - Left atrium: The atrium was mildly dilated.  HOSPITAL COURSE:   Generalized  Weakness Her generalized weakness is most likely secondary to recent upper respiratory tract infection. MRI brain did not show any stroke. ABG was ordered which shows a normal pH with slightly elevated PCO2, which could be her baseline. Blood pressure was slightly on the lower side, so she was given a fluid bolus with improvement in BP. EEG did not show any epileptiform activity. B-12 level 297. She will benefit from supplementation. TSH is normal. MRA does not show any vertebral artery stenosis. Cortisol level was normal. No significant carotid artery stenosis on carotid Dopplers. Echocardiogram does not show any concerning findings. Patient's mental status is back to baseline this morning. Some of this could've been due to medications. She does not have any focal neurological deficits. UTI as the cause is another possibility.  Essential hypertension Blood pressure borderline low. She may resume her atenolol in a few days. She needs to continue to hold her other antihypertensives till she has seen her primary care physician in follow-up.   History of COPD  Patient apparently had upper respiratory tract infection over the past 2-3 weeks. Patient was given nebulizer treatments. She was given a dose of Solu-Medrol. CT chest was done which did not reveal a PE. Patient is at baseline.  Abnormal UA/possible UTI Unfortunately, cultures were not sent. Patient did mention some dysuria in the recent past, along with urinary frequency. She'll be given a prescription for Levaquin.  Depression with anxiety Patient to follow-up with her PCP to consider further treatment options. Cut back on results of Xanax due to excessive sedative effect.  Breast cancer of upper-inner quadrant of left female breast Continue daily Arimidex.   Hypokalemia/hypomagnesemia Potassium and Magnesium repleted. Will be discharged on oral supplementation.  Type 2 diabetes Resume her home medication regimen.  Overall improved.  She has been seen by physical therapy. Home health will be ordered. Prescription for vitamin B-12 had to be electronically sent to the pharmacy.   PERTINENT LABS:  The results of significant diagnostics from this hospitalization (including imaging, microbiology, ancillary and laboratory) are listed below for reference.      Labs: Basic Metabolic Panel:  Recent Labs Lab 09/10/15 1638 09/10/15 2032 09/11/15 0555 09/12/15 0540  NA 136  --  137 142  K 2.8*  --  3.3* 3.4*  CL 87*  --  92* 99*  CO2 35*  --  32 32  GLUCOSE 169*  --  207* 129*  BUN 8  --  6 6  CREATININE 0.77  --  0.69 0.64  CALCIUM 9.6  --  9.2 9.1  MG  --  1.3*  --  1.6*  PHOS  --  2.7  --   --    Liver Function Tests:  Recent Labs Lab 09/11/15 0555  AST 19  ALT 14  ALKPHOS 65  BILITOT 0.9  PROT 6.7  ALBUMIN 2.3*    Recent Labs Lab 09/11/15 1534  AMMONIA 16   CBC:  Recent Labs Lab 09/10/15 1308 09/10/15 1638 09/11/15 0555 09/12/15 0540  WBC 14.6* 12.6* 11.1* 7.0  NEUTROABS  --  10.0* 10.3*  --   HGB 13.4 12.7 11.9* 11.0*  HCT 39.8 38.0 36.8 34.9*  MCV 99.7* 99.7 100.3* 100.6*  PLT  --  404* 417* 444*   BNP: BNP (last 3 results)  Recent Labs  09/10/15 1638  BNP 66.5    CBG:  Recent Labs Lab 09/11/15 1235 09/11/15 1652 09/11/15 2118 09/12/15 0805 09/12/15 1235  GLUCAP 184* 287* 215* 110* 179*     IMAGING STUDIES Ct Head Wo Contrast  09/10/2015  CLINICAL DATA:  Right-sided leg weakness EXAM: CT HEAD WITHOUT CONTRAST TECHNIQUE: Contiguous axial images were obtained from the base of the skull through the vertex without intravenous contrast. COMPARISON:  None. FINDINGS: The ventricular system is slightly prominent as are the cortical sulci, indicative of mild atrophy. The septum is midline in position. No hemorrhage, mass lesion, or acute infarction is seen. On bone window images, no acute calvarial abnormality is noted. IMPRESSION: Mild atrophy.  No acute intracranial  abnormality. Electronically Signed   By: Ivar Drape M.D.   On: 09/10/2015 17:05   Ct Angio Chest Pe W/cm &/or Wo Cm  09/10/2015  CLINICAL DATA:  Shortness of breath beginning at 1 a.m. last night. Initial encounter. EXAM: CT ANGIOGRAPHY CHEST WITH CONTRAST TECHNIQUE: Multidetector CT imaging of the chest was performed using the standard protocol during bolus administration of intravenous contrast. Multiplanar CT image reconstructions and MIPs were obtained to evaluate the vascular anatomy. CONTRAST:  100 ml OMNIPAQUE IOHEXOL 350 MG/ML SOLN COMPARISON:  Plain film the chest earlier today. FINDINGS: No pulmonary embolus is identified. No pleural or pericardial effusion. Heart size is normal. No axillary, hilar or mediastinal lymphadenopathy. The right lobe of the thyroid is enlarged with some calcifications present. Calcific aortic atherosclerosis is noted. The lungs demonstrate some emphysematous change in the apices. Mild dependent atelectasis is noted in the right the lungs are otherwise clear. Incidentally imaged upper abdomen demonstrates no focal abnormality. No focal bony abnormality is seen. Review of the MIP images confirms the above findings. IMPRESSION: Negative for pulmonary embolus or acute disease. Atherosclerosis. Mild appearing emphysema. Electronically Signed   By: Inge Rise M.D.   On: 09/10/2015 18:55   Mr Jodene Nam Head Wo Contrast  09/11/2015  CLINICAL DATA:  70 year old female with abnormal distal right vertebral artery flow void on brain MRI yesterday done for abnormal gait. Initial encounter. EXAM: MRA HEAD WITHOUT CONTRAST TECHNIQUE: Angiographic images of the Circle of Willis were obtained using MRA technique without intravenous contrast. COMPARISON:  Brain MRI 09/10/2015, head CT without contrast 09/10/2015. FINDINGS: Antegrade flow in the posterior circulation. The distal right vertebral artery is patent, and though mildly irregular there is no stenosis identified to the suggested  right V4 segment abnormality. Distal left vertebral artery also mildly irregular, and mildly dominant. Normal left PICA origin. Patent vertebrobasilar junction. Mild basilar artery tortuosity without stenosis. SCA and PCA origins are normal. Posterior communicating arteries are diminutive or absent. Bilateral PCA branches are within normal limits. Tortuous distal cervical right ICA. Antegrade flow in both ICA siphons. Bilateral cavernous segment irregularity without ICA stenosis. Patent carotid termini. Ophthalmic artery origins are within normal limits. Normal MCA and ACA origins. Anterior communicating artery and visualized ACA branches are within normal limits. MCA M1 segments and bifurcations are within normal limits. Visualized bilateral MCA branches are within normal limits. IMPRESSION: Evidence of intracranial atherosclerosis, including in the distal right vertebral artery, but no associated arterial stenosis or decreased intracranial arterial flow. Otherwise negative intracranial MRA. Electronically Signed   By: Genevie Ann M.D.   On: 09/11/2015 09:12   Mr Brain Wo Contrast  09/10/2015  CLINICAL DATA:  Abnormal gait. Difficulty getting up, particularly with her right side. Difficulty with word-finding. Symptoms for at least 3 days. EXAM: MRI HEAD WITHOUT CONTRAST TECHNIQUE: Multiplanar, multiecho pulse sequences of the brain and surrounding structures were obtained without intravenous contrast. COMPARISON:  CT head without contrast from the same day. FINDINGS: The diffusion-weighted images demonstrate no evidence for acute or subacute infarction. No acute hemorrhage or mass lesion is present. Mild generalized atrophy and periventricular white matter changes are noted bilaterally. White matter changes extend into the brainstem. Ventricles are proportionate to the degree of atrophy. No significant extra-axial fluid collection is present. The internal auditory canals are within normal limits bilaterally. The  cerebellum is normal. There is some abnormal signal within the non dominant right vertebral artery. This may represent slow or occluded flow. Flow is otherwise present within the major intracranial arteries. Globes and orbits are intact. The paranasal sinuses are clear. There is fluid in the mastoid air cells bilaterally. No obstructing nasopharyngeal lesion is evident. The skullbase is normal.  Midline sagittal images are unremarkable. IMPRESSION: 1. Mild generalized atrophy and white matter disease is somewhat advanced for age. This is nonspecific, but likely reflects the sequela of chronic microvascular ischemia. 2. Abnormal signal suggesting slow or occluded flow within the right vertebral artery. 3. No other acute or subacute abnormality is present. Electronically Signed   By: San Morelle M.D.   On: 09/10/2015 20:40   Dg Chest Port 1 View  09/10/2015  CLINICAL DATA:  Intermittent shortness of breath, weakness EXAM: PORTABLE CHEST 1 VIEW COMPARISON:  12/13/2013 FINDINGS: Cardiomediastinal silhouette is stable. No infiltrate or pleural effusion. No pulmonary edema. Degenerative changes bilateral AC joints. IMPRESSION: No active disease. Electronically Signed   By: Lahoma Crocker M.D.   On: 09/10/2015 15:32   Dg Hip Unilat With Pelvis 2-3 Views Left  09/11/2015  CLINICAL DATA:  Anterior left hip pain.  No injury. EXAM: DG HIP (WITH OR WITHOUT PELVIS) 2-3V LEFT COMPARISON:  None. FINDINGS: Patient has bilateral total hip arthroplasties. Both appear well seated and aligned. There is no evidence of  loosening of the left hip prosthesis. No fracture.  No bone lesion. SI joints and symphysis pubis are normally spaced and aligned. Soft tissues are unremarkable. IMPRESSION: 1. No fracture or acute finding. 2. No evidence of loosening or malalignment of the left hip prosthesis. Electronically Signed   By: Lajean Manes M.D.   On: 09/11/2015 08:30    DISCHARGE EXAMINATION: Filed Vitals:   09/11/15 2041  09/11/15 2056 09/12/15 0505 09/12/15 0855  BP: 107/57  121/44   Pulse: 82  75   Temp: 98.1 F (36.7 C)  98.4 F (36.9 C)   TempSrc: Oral  Oral   Resp: 16  18   SpO2: 94% 93% 91% 91%   General appearance: alert, cooperative, appears stated age and no distress Resp: clear to auscultation bilaterally Cardio: regular rate and rhythm, S1, S2 normal, no murmur, click, rub or gallop GI: soft, non-tender; bowel sounds normal; no masses,  no organomegaly Extremities: extremities normal, atraumatic, no cyanosis or edema  DISPOSITION: Home with home health  Discharge Instructions    Call MD for:  difficulty breathing, headache or visual disturbances    Complete by:  As directed      Call MD for:  extreme fatigue    Complete by:  As directed      Call MD for:  persistant dizziness or light-headedness    Complete by:  As directed      Call MD for:  persistant nausea and vomiting    Complete by:  As directed      Call MD for:  severe uncontrolled pain    Complete by:  As directed      Call MD for:  temperature >100.4    Complete by:  As directed      Diet - low sodium heart healthy    Complete by:  As directed      Discharge instructions    Complete by:  As directed   Please note the instructions regarding your BP medications. Please see your PCP next week to ask if Amlodipine and HCTZ can be resumed. Stay well hydrated. Take your antibiotics.  You were cared for by a hospitalist during your hospital stay. If you have any questions about your discharge medications or the care you received while you were in the hospital after you are discharged, you can call the unit and asked to speak with the hospitalist on call if the hospitalist that took care of you is not available. Once you are discharged, your primary care physician will handle any further medical issues. Please note that NO REFILLS for any discharge medications will be authorized once you are discharged, as it is imperative that you  return to your primary care physician (or establish a relationship with a primary care physician if you do not have one) for your aftercare needs so that they can reassess your need for medications and monitor your lab values. If you do not have a primary care physician, you can call [phone] for a physician referral.     Increase activity slowly    Complete by:  As directed            ALLERGIES:  Allergies  Allergen Reactions  . Ace Inhibitors Other (See Comments)    Acute renal failure  . Angiotensin Receptor Blockers Other (See Comments)    Acute Renal Failure     Discharge Medication List as of 09/12/2015  2:10 PM    START taking these medications   Details  levofloxacin (LEVAQUIN) 500 MG tablet Take 1 tablet (500 mg total) by mouth at bedtime. For 5 more days, Starting 09/12/2015, Until Discontinued, Print    magnesium oxide (MAG-OX) 400 MG tablet Take 1 tablet (400 mg total) by mouth 2 (two) times daily. For 10 days, Starting 09/12/2015, Until Discontinued, Print      CONTINUE these medications which have CHANGED   Details  ALPRAZolam (XANAX) 1 MG tablet TAKE HALF TABLET BY MOUTH 3 TIMES DAILY AS NEEDED FOR SLEEP OR ANXIETY, No Print    atenolol (TENORMIN) 50 MG tablet Take 2 tablets (100 mg total) by mouth daily. May resume after 3 days, Starting 09/12/2015, Until Discontinued, No Print      CONTINUE these medications which have NOT CHANGED   Details  albuterol (PROVENTIL HFA;VENTOLIN HFA) 108 (90 BASE) MCG/ACT inhaler Inhale 1-2 puffs into the lungs every 4 (four) hours as needed for wheezing or shortness of breath., Until Discontinued, Historical Med    anastrozole (ARIMIDEX) 1 MG tablet Take 1 tablet (1 mg total) by mouth daily., Starting 03/11/2015, Until Discontinued, Normal    Cholecalciferol (VITAMIN D PO) Take 1 capsule by mouth daily., Until Discontinued, Historical Med    indomethacin (INDOCIN) 50 MG capsule Take 1 capsule (50 mg total) by mouth 3 (three) times  daily as needed for moderate pain., Starting 11/23/2014, Until Discontinued, Normal    metFORMIN (GLUCOPHAGE XR) 500 MG 24 hr tablet Take 1 tablet (500 mg total) by mouth daily., Starting 08/20/2015, Until Thu 08/19/16, Normal    potassium chloride (K-DUR) 10 MEQ tablet Take 1 tablet (10 mEq total) by mouth daily., Starting 08/27/2015, Until Discontinued, Normal    sulfacetamide-prednisoLONE (VASOCIDIN) 10-0.23 % ophthalmic solution Place 1 drop into both eyes daily as needed (allergies). , Starting 10/18/2013, Until Discontinued, Historical Med      STOP taking these medications     amLODipine (NORVASC) 10 MG tablet      glucose blood (ONE TOUCH ULTRA TEST) test strip      hydrochlorothiazide (MICROZIDE) 12.5 MG capsule      zoster vaccine live, PF, (ZOSTAVAX) 91478 UNT/0.65ML injection        Follow-up Information    Follow up with DOOLITTLE, Linton Ham, MD. Schedule an appointment as soon as possible for a visit in 1 week.   Specialties:  Internal Medicine, Adolescent Medicine   Why:  post hospitalization follow up and to check your Blood pressure to see if your BP meds can be resumed   Contact information:   [place] [id number]       Follow up with [facility].   Why:  Home health PT arranged   Contact information:   [address] [place] [address] [phone]       Follow up with [place].   Why:  Rolling walker to be deliver to bedside   Contact information:   [address] [place] [address] [phone]       TOTAL DISCHARGE TIME: 35 minutes  [facility] Pager [phone]  09/12/2015, 3:20 PM

## 2015-09-18 ENCOUNTER — Telehealth: Payer: Self-pay | Admitting: Oncology

## 2015-09-18 NOTE — Telephone Encounter (Signed)
Pt called to r/s her apt pt has been in the hospital, r/s apt and pt is going to call to get her MM set up and have it faxed over to [place]... KJ

## 2015-10-14 ENCOUNTER — Telehealth: Payer: Self-pay | Admitting: Oncology

## 2015-10-14 ENCOUNTER — Other Ambulatory Visit (HOSPITAL_BASED_OUTPATIENT_CLINIC_OR_DEPARTMENT_OTHER): Payer: Medicare Other

## 2015-10-14 ENCOUNTER — Ambulatory Visit (HOSPITAL_BASED_OUTPATIENT_CLINIC_OR_DEPARTMENT_OTHER): Payer: Medicare Other | Admitting: Oncology

## 2015-10-14 VITALS — BP 126/45 | HR 82 | Temp 98.0°F | Resp 18 | Wt 183.4 lb

## 2015-10-14 DIAGNOSIS — C50212 Malignant neoplasm of upper-inner quadrant of left female breast: Secondary | ICD-10-CM

## 2015-10-14 DIAGNOSIS — Z170 Estrogen receptor positive status [ER+]: Secondary | ICD-10-CM | POA: Diagnosis not present

## 2015-10-14 DIAGNOSIS — C50912 Malignant neoplasm of unspecified site of left female breast: Secondary | ICD-10-CM | POA: Diagnosis not present

## 2015-10-14 DIAGNOSIS — M25561 Pain in right knee: Secondary | ICD-10-CM | POA: Diagnosis not present

## 2015-10-14 LAB — CBC WITH DIFFERENTIAL/PLATELET
BASO%: 0.8 % (ref 0.0–2.0)
BASOS ABS: 0 10*3/uL (ref 0.0–0.1)
EOS ABS: 0.1 10*3/uL (ref 0.0–0.5)
EOS%: 2.5 % (ref 0.0–7.0)
HCT: 40.2 % (ref 34.8–46.6)
HEMOGLOBIN: 12.8 g/dL (ref 11.6–15.9)
LYMPH#: 1.5 10*3/uL (ref 0.9–3.3)
LYMPH%: 41.3 % (ref 14.0–49.7)
MCH: 31.7 pg (ref 25.1–34.0)
MCHC: 32 g/dL (ref 31.5–36.0)
MCV: 99 fL (ref 79.5–101.0)
MONO#: 0.6 10*3/uL (ref 0.1–0.9)
MONO%: 15.4 % — AB (ref 0.0–14.0)
NEUT#: 1.5 10*3/uL (ref 1.5–6.5)
NEUT%: 40 % (ref 38.4–76.8)
Platelets: 337 10*3/uL (ref 145–400)
RBC: 4.06 10*6/uL (ref 3.70–5.45)
RDW: 17.3 % — AB (ref 11.2–14.5)
WBC: 3.7 10*3/uL — ABNORMAL LOW (ref 3.9–10.3)

## 2015-10-14 NOTE — Telephone Encounter (Signed)
Gave and printed appt sched and avs for pt for Jan 2018 °

## 2015-10-14 NOTE — Progress Notes (Signed)
Hematology and Oncology Follow Up Visit  [name] [id number] 1945-12-01 70 y.o. 10/14/2015 8:26 AM [name], MDDoolittle, Karen Ham, MD   Principle Diagnosis: 70 year old woman with multifocal left sided breast cancer diagnosed in June of 2015. 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 Ki- 67 is 9% with a recurrence score of 6 on Oncotype DX.   Prior Therapy: She is status post left mastectomy with sentinel lymph node biopsy on 12/20/2013. The final pathology showed invasive ductal carcinoma pathological staging T1cN0. Complicated with chest wall healing and flap necrosis.  Current therapy: Arimidex 1 mg daily started in August of 2015.  Interim History:  [name] presents today for a followup visit. Since her last visit, she was hospitalized in March 2017 for weakness, dehydration, COPD exacerbation and increase high blood pressure. Since her discharge she has been feeling relatively fair. She does report right knee pain which him during her ambulation. She is using a brace as well as a crutch for mobility. Despite that she still able to drive and attends to activities of daily living.    She is currently on Arimidex without any new side effects. She did not report any arthralgias, myalgias or any GI toxicity. She did not report any pain on her chest wall. She did not report any chest wall masses or axillary lymphadenopathy.  She has not had any headaches or blurry vision. Does not report any syncope or seizures. She has not had any fevers or chills or change in her activity level. He does not report any palpitation or lymphedema. She reports now any shortness of breath or cough. She is not report report any nausea or vomiting. She reports no abdominal pain and distention. She reports no constipation or diarrhea. She does not report any petechiae or lymphadenopathy. The rest of her review of systems is  unremarkable.  Medications: I have reviewed the patient's current medications.  Current Outpatient Prescriptions  Medication Sig Dispense Refill  . albuterol (PROVENTIL HFA;VENTOLIN HFA) 108 (90 BASE) MCG/ACT inhaler Inhale 1-2 puffs into the lungs every 4 (four) hours as needed for wheezing or shortness of breath.    . ALPRAZolam (XANAX) 1 MG tablet TAKE HALF TABLET BY MOUTH 3 TIMES DAILY AS NEEDED FOR SLEEP OR ANXIETY 90 tablet 1  . anastrozole (ARIMIDEX) 1 MG tablet Take 1 tablet (1 mg total) by mouth daily. 90 tablet 3  . atenolol (TENORMIN) 50 MG tablet Take 2 tablets (100 mg total) by mouth daily. May resume after 3 days 180 tablet 3  . Cholecalciferol (VITAMIN D PO) Take 1 capsule by mouth daily.    . indomethacin (INDOCIN) 50 MG capsule Take 1 capsule (50 mg total) by mouth 3 (three) times daily as needed for moderate pain. 60 capsule 0  . magnesium oxide (MAG-OX) 400 (241.3 Mg) MG tablet As directed  0  . magnesium oxide (MAG-OX) 400 MG tablet Take 1 tablet (400 mg total) by mouth 2 (two) times daily. For 10 days 20 tablet 0  . metFORMIN (GLUCOPHAGE XR) 500 MG 24 hr tablet Take 1 tablet (500 mg total) by mouth daily. 90 tablet 1  . potassium chloride (K-DUR) 10 MEQ tablet Take 1 tablet (10 mEq total) by mouth daily. 30 tablet 2  . sulfacetamide-prednisoLONE (VASOCIDIN) 10-0.23 % ophthalmic solution Place 1 drop into both eyes daily as needed (allergies).     . vitamin B-12 (CYANOCOBALAMIN) 1000 MCG tablet Take 1  tablet (1,000 mcg total) by mouth daily. 30 tablet 3   No current facility-administered medications for this visit.     Allergies:  Allergies  Allergen Reactions  . Ace Inhibitors Other (See Comments)    Acute renal failure  . Angiotensin Receptor Blockers Other (See Comments)    Acute Renal Failure    Past Medical History, Surgical history, Social history, and Family History were reviewed and updated.   Physical Exam: Blood pressure 126/45, pulse 82, temperature  98 F (36.7 C), temperature source Oral, resp. rate 18, weight 183 lb 6.4 oz (83.19 kg), SpO2 96 %. ECOG: 0 General appearance: Alert, awake woman without distress. Head: Normocephalic, without obvious abnormality Neck: no adenopathy Lymph nodes: Cervical, supraclavicular, and axillary nodes normal. Heart:regular rate and rhythm, S1, S2 normal, no murmur, click, rub or gallop Lung:chest clear, no wheezing, rales, normal symmetric air entry Chest wall examination revealed well-healed. Left mastectomy site without nodularity. Abdomin: soft, non-tender, without masses or organomegaly no shifting dullness or ascites. EXT:no erythema, induration, or nodules   Lab Results: Lab Results  Component Value Date   WBC 3.7* 10/14/2015   HGB 12.8 10/14/2015   HCT 40.2 10/14/2015   MCV 99.0 10/14/2015   PLT 337 10/14/2015     Chemistry      Component Value Date/Time   NA 142 09/12/2015 0540   NA 135* 03/11/2015 0828   K 3.4* 09/12/2015 0540   K 3.6 03/11/2015 0828   CL 99* 09/12/2015 0540   CO2 32 09/12/2015 0540   CO2 24 03/11/2015 0828   BUN 6 09/12/2015 0540   BUN 17.0 03/11/2015 0828   CREATININE 0.64 09/12/2015 0540   CREATININE 0.89 08/20/2015 1118   CREATININE 1.5* 03/11/2015 0828      Component Value Date/Time   CALCIUM 9.1 09/12/2015 0540   CALCIUM 8.2* 03/11/2015 0828   ALKPHOS 65 09/11/2015 0555   ALKPHOS 115 03/11/2015 0828   AST 19 09/11/2015 0555   AST 128* 03/11/2015 0828   ALT 14 09/11/2015 0555   ALT 67* 03/11/2015 0828   BILITOT 0.9 09/11/2015 0555   BILITOT 2.22* 03/11/2015 0828        Impression and Plan:  70 year old woman with the following issues:  1. 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 Ki-67 of 9%. She has a low recurrence score of 6 on her Oncotype DX. 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 She is status post left mastectomy in June of 2015 followed by  debridements in July of 2015.   She is currently on Arimidex since August 2015 and tolerated it well. The plan is to continue to come medication for at least 5 years at least.  2. Knee pain: She is seen orthopedic specialist regarding this issue. Unrelated to her aromatase inhibitor.  3. Mammography surveillance: Her last mammogram was in April 2016. This will be repeated in the near future.  4. Osteoporosis prophylaxis: She will be monitored with bone density scan periodically. This will be arranged before the end of the year.  5. Followup: Will be in 6 months.     [facility], MD 4/11/20178:26 AM

## 2015-12-08 ENCOUNTER — Other Ambulatory Visit: Payer: Self-pay | Admitting: General Surgery

## 2015-12-08 DIAGNOSIS — Z1231 Encounter for screening mammogram for malignant neoplasm of breast: Secondary | ICD-10-CM

## 2015-12-17 ENCOUNTER — Ambulatory Visit
Admission: RE | Admit: 2015-12-17 | Discharge: 2015-12-17 | Disposition: A | Discharge status: Home / Self Care | Payer: Medicare Other | Source: Ambulatory Visit | Attending: General Surgery | Admitting: General Surgery

## 2015-12-17 DIAGNOSIS — Z1231 Encounter for screening mammogram for malignant neoplasm of breast: Secondary | ICD-10-CM

## 2016-01-16 ENCOUNTER — Other Ambulatory Visit: Payer: Self-pay

## 2016-01-16 MED ORDER — POTASSIUM CHLORIDE ER 10 MEQ PO TBCR: 10.0000 meq | EXTENDED_RELEASE_TABLET | Freq: Every day | ORAL | Status: DC

## 2016-03-10 ENCOUNTER — Other Ambulatory Visit: Payer: Self-pay | Admitting: Physician Assistant

## 2016-03-10 DIAGNOSIS — M79672 Pain in left foot: Secondary | ICD-10-CM

## 2016-03-10 MED ORDER — METFORMIN HCL ER 500 MG PO TB24: 500.0000 mg | ORAL_TABLET | Freq: Every day | ORAL | 0 refills | Status: DC

## 2016-03-24 ENCOUNTER — Other Ambulatory Visit: Payer: Self-pay

## 2016-03-24 NOTE — Telephone Encounter (Signed)
Patient is calling to request a refill for alprazolam.   [phone]

## 2016-03-24 NOTE — Telephone Encounter (Signed)
Last saw [name] for this on 08/20/15 and was given #90 (30 day supply) + 1 RF. SIG ON THAT RX WAS 1 TAB TID.  This most current Rx (written by hospitalist?) was for 1/2 tab TID. I left it at this dose and changed # 45 for 30 day supply.

## 2016-03-29 ENCOUNTER — Telehealth: Payer: Self-pay

## 2016-03-30 ENCOUNTER — Ambulatory Visit (INDEPENDENT_AMBULATORY_CARE_PROVIDER_SITE_OTHER): Payer: Medicare Other | Admitting: Family Medicine

## 2016-03-30 VITALS — BP 110/70 | HR 72 | Temp 98.5°F | Resp 16 | Ht 63.0 in | Wt 176.6 lb

## 2016-03-30 DIAGNOSIS — R5383 Other fatigue: Secondary | ICD-10-CM

## 2016-03-30 DIAGNOSIS — Z23 Encounter for immunization: Secondary | ICD-10-CM | POA: Diagnosis not present

## 2016-03-30 DIAGNOSIS — Z8639 Personal history of other endocrine, nutritional and metabolic disease: Secondary | ICD-10-CM

## 2016-03-30 DIAGNOSIS — F418 Other specified anxiety disorders: Secondary | ICD-10-CM

## 2016-03-30 DIAGNOSIS — I10 Essential (primary) hypertension: Secondary | ICD-10-CM | POA: Diagnosis not present

## 2016-03-30 DIAGNOSIS — E119 Type 2 diabetes mellitus without complications: Secondary | ICD-10-CM | POA: Diagnosis not present

## 2016-03-30 DIAGNOSIS — Z8739 Personal history of other diseases of the musculoskeletal system and connective tissue: Secondary | ICD-10-CM

## 2016-03-30 LAB — COMPLETE METABOLIC PANEL WITH GFR
ALK PHOS: 44 U/L (ref 33–130)
ALT: 7 U/L (ref 6–29)
AST: 16 U/L (ref 10–35)
Albumin: 3.7 g/dL (ref 3.6–5.1)
BILIRUBIN TOTAL: 0.8 mg/dL (ref 0.2–1.2)
BUN: 7 mg/dL (ref 7–25)
CALCIUM: 8.9 mg/dL (ref 8.6–10.4)
CHLORIDE: 97 mmol/L — AB (ref 98–110)
CO2: 32 mmol/L — ABNORMAL HIGH (ref 20–31)
CREATININE: 0.82 mg/dL (ref 0.60–0.93)
GFR, Est African American: 83 mL/min (ref >=60)
GFR, Est Non African American: 72 mL/min (ref >=60)
Glucose, Bld: 146 mg/dL — ABNORMAL HIGH (ref 65–99)
Potassium: 2.8 mmol/L — ABNORMAL LOW (ref 3.5–5.3)
Sodium: 139 mmol/L (ref 135–146)
TOTAL PROTEIN: 6.1 g/dL (ref 6.1–8.1)

## 2016-03-30 LAB — CBC WITH DIFFERENTIAL/PLATELET
BASOS ABS: 0 {cells}/uL (ref 0–200)
BASOS PCT: 0 %
EOS ABS: 53 {cells}/uL (ref 15–500)
Eosinophils Relative: 1 %
HEMATOCRIT: 37 % (ref 35.0–45.0)
Hemoglobin: 12.4 g/dL (ref 11.7–15.5)
LYMPHS PCT: 20 %
Lymphs Abs: 1060 cells/uL (ref 850–3900)
MCH: 32.7 pg (ref 27.0–33.0)
MCHC: 33.5 g/dL (ref 32.0–36.0)
MCV: 97.6 fL (ref 80.0–100.0)
MONO ABS: 689 {cells}/uL (ref 200–950)
MPV: 9.1 fL (ref 7.5–12.5)
Monocytes Relative: 13 %
Neutro Abs: 3498 cells/uL (ref 1500–7800)
Neutrophils Relative %: 66 %
Platelets: 269 10*3/uL (ref 140–400)
RBC: 3.79 MIL/uL — ABNORMAL LOW (ref 3.80–5.10)
RDW: 20.1 % — AB (ref 11.0–15.0)
WBC: 5.3 10*3/uL (ref 3.8–10.8)

## 2016-03-30 LAB — MICROALBUMIN, URINE: Microalb, Ur: 5 mg/dL

## 2016-03-30 LAB — LIPID PANEL
Cholesterol: 159 mg/dL (ref 125–200)
HDL: 95 mg/dL (ref >=46)
LDL CALC: 36 mg/dL (ref <130)
Total CHOL/HDL Ratio: 1.7 Ratio (ref <=5.0)
Triglycerides: 142 mg/dL (ref <150)
VLDL: 28 mg/dL (ref <30)

## 2016-03-30 LAB — TSH: TSH: 3.05 m[IU]/L

## 2016-03-30 MED ORDER — ALPRAZOLAM 1 MG PO TABS: 1.0000 mg | ORAL_TABLET | Freq: Two times a day (BID) | ORAL | 0 refills | Status: DC | PRN

## 2016-03-30 MED ORDER — METFORMIN HCL ER 500 MG PO TB24: 500.0000 mg | ORAL_TABLET | Freq: Every day | ORAL | 1 refills | Status: DC

## 2016-03-30 MED ORDER — INDOMETHACIN 50 MG PO CAPS: 50.0000 mg | ORAL_CAPSULE | Freq: Three times a day (TID) | ORAL | 0 refills | Status: DC | PRN

## 2016-03-30 MED ORDER — ATENOLOL 50 MG PO TABS: 100.0000 mg | ORAL_TABLET | Freq: Every day | ORAL | 3 refills | Status: DC

## 2016-03-30 NOTE — Patient Instructions (Addendum)
Return for follow-up in 6 months for diabetes and hypertension receheck.   If your hemoglobin A1C is greater than 7.5, I'll prefer to see you for a recheck in 3 months.  It was nice meeting you!  I look forward to being your primary healthcare provider.  [name]. Kenton Kingfisher, MSN, FNP-C [facility] [place] [facility]    IF you received an x-ray today, you will receive an invoice from [facility]. Please contact [facility] at [phone] with questions or concerns regarding your invoice.   IF you received labwork today, you will receive an invoice from [employer]. Please contact [facility] at [phone] with questions or concerns regarding your invoice.   Our billing staff will not be able to assist you with questions regarding bills from these companies.  You will be contacted with the lab results as soon as they are available. The fastest way to get your results is to activate your My Chart account. Instructions are located on the last page of this paperwork. If you have not heard from [place] regarding the results in 2 weeks, please contact this office.

## 2016-03-30 NOTE — Progress Notes (Signed)
Patient ID: [name], female    DOB: 01-12-1946, 70 y.o.   MRN: [id number]  PCP: Molli Barrows, FNP  Chief Complaint  Patient presents with  . Medication Refill    need to establish carfe     Subjective:   HPI 70 year old, female presents for medication follow-up and to establish care with a new provider. Patient has been followed at [facility] for several years under the care of Dr. Sonia Baller.  She has the following chronic problems: Breast Cancer with mastectomy of left breast, Hypertension, Type 2 Diabetes, COPD, Depression/Anxiety, and Gout.  Hypertension  Good overall control per patient. She doesn't routinely check her blood pressure.Although she notes that each time she is seen by her oncologist her readings are "good". She takes her atenolol daily. Denies shortness of breath, chest pain, or headaches.  Diabetes Reports routine checks of blood sugar.  For the most part she reports her readings are in the 120's with an occasional reading exceeding 200. She drinks plenty of water and avoids sodas or sweetened beverages.  She drinks an occasional shot of vodka a few time per month.  Gout She reports no recent flares within the last two months.  She requests refills on indomethacin for future occurrences.  She reports that her gouts responds quickly to medication. She feels that she has recently experienced less frequent flares.  Anxiety Reports she has been taking alprazolam for several years.  She reports taking 1 pill per day with sufficient control of symptoms. She doesn't take medication at night for sleep.  History of breast cancer of left  Still followed by breast center and reports that scan have been negative.  She is currently treated with oral regimen of chemotherapy, anastrozole 1 mg tablet daily.  Social History   Social History  . Marital status: Divorced    Spouse name: n/a  . Number of children: 1  . Years of education: 40   Occupational History  .  retired     Network engineer   Social History Main Topics  . Smoking status: Former Smoker    Packs/day: 0.50    Years: 40.00    Types: Cigarettes    Quit date: 11/11/2000  . Smokeless tobacco: Never Used  . Alcohol use 0.6 - 1.2 oz/week    1 - 2 Shots of liquor per week     Comment: weekly   . Drug use:     Types: Marijuana     Comment: "last smoked marijuana in ~ 2010; recreational user only"  . Sexual activity: No   Other Topics Concern  . Not on file   Social History Narrative   Her daughter and granddaughter live with her.   Family History  Problem Relation Age of Onset  . Asthma Mother   . Stroke Mother   . Asthma Father   . Alcohol abuse Father   . Colon cancer Neg Hx    Review of Systems  SEE HPI        Patient Active Problem List   Diagnosis Date Noted  . Weakness 09/10/2015  . Hypokalemia 09/10/2015  . COPD exacerbation ([place]) 09/10/2015  . Type 2 diabetes mellitus ([place]) 09/10/2015  . Hypomagnesemia 09/10/2015  . Gout 08/20/2015  . Skin flap necrosis ([place]) 01/17/2014  . Breast cancer of upper-inner quadrant of left female breast ([place]) 11/16/2013  . Abnormal LFTs--hep c neg 08/24/2013  . HNP (herniated nucleus pulposus), thoracic 07/17/2013  . History of tobacco use  05/24/2013  . Depression with anxiety 05/24/2013  . Vitamin D deficiency 05/24/2013  . Venous insufficiency 05/24/2013  . H/O bilateral hip replacements 01/16/2013  . BMI 36.0-36.9,adult 11/19/2011  . DM (diabetes mellitus) ([place]) 11/19/2011  . HTN (hypertension) 11/19/2011  . Asthma 11/19/2011     Prior to Admission medications   Medication Sig Start Date End Date Taking? Authorizing Provider  albuterol (PROVENTIL HFA;VENTOLIN HFA) 108 (90 BASE) MCG/ACT inhaler Inhale 1-2 puffs into the lungs every 4 (four) hours as needed for wheezing or shortness of breath.   Yes Historical Provider, MD  ALPRAZolam (XANAX) 1 MG tablet TAKE HALF TABLET BY MOUTH 3 TIMES DAILY AS NEEDED FOR  SLEEP OR ANXIETY 09/12/15  Yes Bonnielee Haff, MD  anastrozole (ARIMIDEX) 1 MG tablet Take 1 tablet (1 mg total) by mouth daily. 03/11/15  Yes Wyatt Portela, MD  atenolol (TENORMIN) 50 MG tablet Take 2 tablets (100 mg total) by mouth daily. May resume after 3 days 09/12/15  Yes Bonnielee Haff, MD  Cholecalciferol (VITAMIN D PO) Take 1 capsule by mouth daily.   Yes Historical Provider, MD  glucose blood (ONE TOUCH ULTRA TEST) test strip Test blood sugar once daily. Dx: E11.9 03/10/16  Yes Chelle Jeffery, PA-C  indomethacin (INDOCIN) 50 MG capsule TAKE 1 CAPSULE (50 MG TOTAL) BY MOUTH 3 (THREE) TIMES DAILY AS NEEDED FOR MODERATE PAIN. 03/10/16  Yes Chelle Jeffery, PA-C  magnesium oxide (MAG-OX) 400 (241.3 Mg) MG tablet As directed 09/19/15  Yes Historical Provider, MD  magnesium oxide (MAG-OX) 400 MG tablet Take 1 tablet (400 mg total) by mouth 2 (two) times daily. For 10 days 09/12/15  Yes Bonnielee Haff, MD  metFORMIN (GLUCOPHAGE XR) 500 MG 24 hr tablet Take 1 tablet (500 mg total) by mouth daily. 03/10/16 03/10/17 Yes Chelle Jeffery, PA-C  potassium chloride (K-DUR) 10 MEQ tablet Take 1 tablet (10 mEq total) by mouth daily. 01/16/16  Yes Jaynee Eagles, PA-C  sulfacetamide-prednisoLONE (VASOCIDIN) 10-0.23 % ophthalmic solution Place 1 drop into both eyes daily as needed (allergies).  10/18/13  Yes Historical Provider, MD  vitamin B-12 (CYANOCOBALAMIN) 1000 MCG tablet Take 1 tablet (1,000 mcg total) by mouth daily. 09/12/15  Yes Bonnielee Haff, MD     Allergies  Allergen Reactions  . Ace Inhibitors Other (See Comments)    Acute renal failure  . Angiotensin Receptor Blockers Other (See Comments)    Acute Renal Failure       Objective:  Physical Exam  Constitutional: She is oriented to person, place, and time. She appears well-developed and well-nourished.  HENT:  Head: Normocephalic and atraumatic.  Right Ear: External ear normal.  Left Ear: External ear normal.  Eyes: Conjunctivae and EOM are normal.  Pupils are equal, round, and reactive to light.  Neck: Normal range of motion. Neck supple.  Cardiovascular: Normal rate, regular rhythm, normal heart sounds and intact distal pulses.   Pulmonary/Chest: Effort normal and breath sounds normal.  Musculoskeletal: Normal range of motion.  Neurological: She is alert and oriented to person, place, and time.  Skin: Skin is warm and dry.  Psychiatric: She has a normal mood and affect. Her behavior is normal. Judgment and thought content normal.    Diabetic Foot Exam - Simple   Simple Foot Form Diabetic Foot exam was performed with the following findings:  Yes 03/30/2016  9:00 AM  Visual Inspection No deformities, no ulcerations, no other skin breakdown bilaterally:  Yes Sensation Testing Intact to touch and monofilament testing bilaterally:  Yes  Pulse Check See comments:  Yes Comments Dorsalis pulse +2 bilaterally         Vitals:   03/30/16 0818  BP: 110/70  Pulse: 72  Resp: 16  Temp: 98.5 F (36.9 C)   Assessment & Plan:  1. Essential hypertension, Controlled/Stable Plan: Continue Atenolol 100 mg daily  2. Type 2 diabetes mellitus without complication, without long-term current use of insulin (HCC), Controlled - Microalbumin, urine - COMPLETE METABOLIC PANEL WITH GFR - Hemoglobin A1c - Lipid panel  Plan: Continue metformin 500 mg daily Continue to increase physical activity as tolerated.   3. History of Gout, patient experiences intermittent gout flare-ups that responsive to indomethacin.  Plan: Continue indomethacin (INDOCIN) 50 MG capsule; Take 1 capsule (50 mg total) by mouth 3 (three) times daily as needed for moderate pain.    4. Depression with anxiety, stable/controlled Plan: Continue Alprazolam 1 mg, two times daily as needed.  5. Need for prophylactic vaccination and inoculation against influenza - Flu Vaccine QUAD 36+ mos IM  Carroll Sage. Kenton Kingfisher, MSN, FNP-C [facility]

## 2016-03-31 LAB — HEMOGLOBIN A1C
HEMOGLOBIN A1C: 5.8 % — AB (ref <5.7)
Mean Plasma Glucose: 120 mg/dL

## 2016-04-01 ENCOUNTER — Telehealth: Payer: Self-pay | Admitting: Family Medicine

## 2016-04-01 ENCOUNTER — Encounter: Payer: Self-pay | Admitting: Family Medicine

## 2016-04-01 DIAGNOSIS — E876 Hypokalemia: Secondary | ICD-10-CM

## 2016-04-01 MED ORDER — POTASSIUM CHLORIDE CRYS ER 20 MEQ PO TBCR: 20.0000 meq | EXTENDED_RELEASE_TABLET | Freq: Four times a day (QID) | ORAL | 0 refills | Status: DC

## 2016-04-01 NOTE — Progress Notes (Signed)
April 01, 2016   [address] [place] [address]   Dear Ms. Parham,  Below are the results from your recent visit and your results were as expected.  Resulted Orders  Microalbumin, urine  Result Value Ref Range   Microalb, Ur 5.0 Not estab mg/dL     Comment:     The ADA has defined abnormalities in albumin excretion as follows:           Category           Result                            (mcg/mg creatinine)                 Normal:    <30       Microalbuminuria:    30 - 299   Clinical albuminuria:    > or = 300   The ADA recommends that at least two of three specimens collected within a 3 - 6 month period be abnormal before considering a patient to be within a diagnostic category.      Narrative   Performed at:  [address], [address]                [place], [place] [address]  CBC with Differential/Platelet  Result Value Ref Range   WBC 5.3 3.8 - 10.8 K/uL   RBC 3.79 (L) 3.80 - 5.10 MIL/uL   Hemoglobin 12.4 11.7 - 15.5 g/dL   HCT 37.0 35.0 - 45.0 %   MCV 97.6 80.0 - 100.0 fL   MCH 32.7 27.0 - 33.0 pg   MCHC 33.5 32.0 - 36.0 g/dL   RDW 20.1 (H) 11.0 - 15.0 %   Platelets 269 140 - 400 K/uL   MPV 9.1 7.5 - 12.5 fL   Neutro Abs 3,498 1,500 - 7,800 cells/uL   Lymphs Abs 1,060 850 - 3,900 cells/uL   Monocytes Absolute 689 200 - 950 cells/uL   Eosinophils Absolute 53 15 - 500 cells/uL   Basophils Absolute 0 0 - 200 cells/uL   Neutrophils Relative % 66 %   Lymphocytes Relative 20 %   Monocytes Relative 13 %   Eosinophils Relative 1 %   Basophils Relative 0 %   Smear Review Criteria for review not met    Narrative   Performed at:  [facility]                [address], [address]                [place], [place] [address]  COMPLETE METABOLIC PANEL WITH GFR  Result Value Ref Range   Sodium 139 135 - 146 mmol/L   Potassium 2.8 (L) 3.5 - 5.3 mmol/L     Comment:     Result repeated and  verified.   Chloride 97 (L) 98 - 110 mmol/L   CO2 32 (H) 20 - 31 mmol/L   Glucose, Bld 146 (H) 65 - 99 mg/dL   BUN 7 7 - 25 mg/dL   Creat 0.82 0.60 - 0.93 mg/dL     Comment:       For patients > or = 71 years of age: The upper reference limit for Creatinine is approximately 13% higher for people identified as African-American.  Total Bilirubin 0.8 0.2 - 1.2 mg/dL   Alkaline Phosphatase 44 33 - 130 U/L   AST 16 10 - 35 U/L   ALT 7 6 - 29 U/L   Total Protein 6.1 6.1 - 8.1 g/dL   Albumin 3.7 3.6 - 5.1 g/dL   Calcium 8.9 8.6 - 10.4 mg/dL   GFR, Est African American 83 >=60 mL/min   GFR, Est Non African American 72 >=60 mL/min   Narrative   Performed at:  [facility]                [address]                [place], [place] [address]  Hemoglobin A1c  Result Value Ref Range   Hgb A1c MFr Bld 5.8 (H) <5.7 %     Comment:       For someone without known diabetes, a hemoglobin A1c value between 5.7% and 6.4% is consistent with prediabetes and should be confirmed with a follow-up test.   For someone with known diabetes, a value <7% indicates that their diabetes is well controlled. A1c targets should be individualized based on duration of diabetes, age, co-morbid conditions and other considerations.   This assay result is consistent with an increased risk of diabetes.   Currently, no consensus exists regarding use of hemoglobin A1c for diagnosis of diabetes in children.      Mean Plasma Glucose 120 mg/dL   Narrative   Performed at:  [address], [address]                [place],  [address]  Lipid panel  Result Value Ref Range   Cholesterol 159 125 - 200 mg/dL   Triglycerides 142 <150 mg/dL   HDL 95 >=46 mg/dL   Total CHOL/HDL Ratio 1.7 <=5.0 Ratio   VLDL 28 <30 mg/dL   LDL Cholesterol 36 <130 mg/dL     Comment:       Total Cholesterol/HDL Ratio:CHD Risk                        Coronary Heart Disease  Risk Table                                        Men       Women          1/2 Average Risk              3.4        3.3              Average Risk              5.0        4.4           2X Average Risk              9.6        7.1           3X Average Risk             23.4       11.0 Use the calculated Patient Ratio above and the CHD Risk table  to determine the patient's CHD Risk.  Narrative   Performed at:  [facility]                [address]                [place], [place] [address]  TSH  Result Value Ref Range   TSH 3.05 mIU/L     Comment:       Reference Range   > or = 20 Years  0.40-4.50   Pregnancy Range First trimester  0.26-2.66 Second trimester 0.55-2.73 Third trimester  0.43-2.91      Narrative   Performed at:  [place], [address]                [place], [place] [address]     If you have any questions or concerns, please don't hesitate to call.  Sincerely,   Carroll Sage. Kenton Kingfisher, MSN, FNP-C [facility]

## 2016-04-01 NOTE — Telephone Encounter (Signed)
Called the patient to advise of low potassium. Ordered potassium replaced with 20 meq, four times daily for 3 days.  Return to the office on Saturday for potassium recheck. Patient would like to come in at 9:00 am on Saturday. Please add patient to my schedule on 04/03/16 at 9:00 am.  Carroll Sage. Kenton Kingfisher, MSN, FNP-C [facility]

## 2016-04-02 NOTE — Telephone Encounter (Signed)
[name] will add pt to your schedule Saturday 9/30

## 2016-04-03 ENCOUNTER — Ambulatory Visit: Payer: Medicare Other

## 2016-04-06 ENCOUNTER — Ambulatory Visit: Payer: Medicare Other

## 2016-04-06 ENCOUNTER — Other Ambulatory Visit: Payer: Self-pay | Admitting: Family Medicine

## 2016-04-06 DIAGNOSIS — E876 Hypokalemia: Secondary | ICD-10-CM

## 2016-04-06 NOTE — Progress Notes (Signed)
Please call patient to request that she return to the office for lab work to check her potassium. She was scheduled for September 30th and never showed up.  Her potassium level was 2.8. Replacement sent over to pharmacy and would like to check a repeat level to ensure it's within normal range.  [name]. Kenton Kingfisher, MSN, FNP-C [facility]

## 2016-04-07 ENCOUNTER — Ambulatory Visit (INDEPENDENT_AMBULATORY_CARE_PROVIDER_SITE_OTHER): Payer: Medicare Other | Admitting: Family Medicine

## 2016-04-07 DIAGNOSIS — E876 Hypokalemia: Secondary | ICD-10-CM

## 2016-04-07 LAB — BASIC METABOLIC PANEL
BUN: 10 mg/dL (ref 7–25)
CHLORIDE: 102 mmol/L (ref 98–110)
CO2: 29 mmol/L (ref 20–31)
CREATININE: 0.75 mg/dL (ref 0.60–0.93)
Calcium: 9.4 mg/dL (ref 8.6–10.4)
Glucose, Bld: 139 mg/dL — ABNORMAL HIGH (ref 65–99)
Potassium: 3.8 mmol/L (ref 3.5–5.3)
Sodium: 141 mmol/L (ref 135–146)

## 2016-04-07 NOTE — Patient Instructions (Signed)
     IF you received an x-ray today, you will receive an invoice from [facility]. Please contact [facility] at [phone] with questions or concerns regarding your invoice.   IF you received labwork today, you will receive an invoice from [facility]. Please contact [facility] at [phone] with questions or concerns regarding your invoice.   Our billing staff will not be able to assist you with questions regarding bills from these companies.  You will be contacted with the lab results as soon as they are available. The fastest way to get your results is to activate your My Chart account. Instructions are located on the last page of this paperwork. If you have not heard from us regarding the results in 2 weeks, please contact this office.      

## 2016-04-08 ENCOUNTER — Telehealth: Payer: Self-pay

## 2016-04-08 NOTE — Telephone Encounter (Signed)
Pt was here recently and was told she had low Potassium. She would like her potassium chloride SA (K-DUR,KLOR-CON) 20 MEQ tablet [id number] filled. She would like [place] to use [id number] - [place], [place] - [address]. Please advise at [phone]

## 2016-04-09 ENCOUNTER — Encounter: Payer: Self-pay | Admitting: Family Medicine

## 2016-04-09 NOTE — Progress Notes (Signed)
Labs only visit to recheck potassium level.  Vitals:   04/07/16 0904  BP: 138/74  Pulse: 82  Resp: 16  Temp: 99 F (37.2 C)    Reinhardt Licausi S. Kenton Kingfisher, MSN, FNP-C [facility]

## 2016-04-09 NOTE — Progress Notes (Signed)
April 09, 2016   [address] [place] [address]   Dear Ms. Bottoms,  Below are the results from your recent visit were as expected and your potassium has returned to a normal level.  Resulted Orders  Basic metabolic panel  Result Value Ref Range   Sodium 141 135 - 146 mmol/L   Potassium 3.8 3.5 - 5.3 mmol/L   Chloride 102 98 - 110 mmol/L   CO2 29 20 - 31 mmol/L   Glucose, Bld 139 (H) 65 - 99 mg/dL   BUN 10 7 - 25 mg/dL   Creat 0.75 0.60 - 0.93 mg/dL     Comment:       For patients > or = 71 years of age: The upper reference limit for Creatinine is approximately 13% higher for people identified as African-American.      Calcium 9.4 8.6 - 10.4 mg/dL   Narrative   Performed at:  [place], [id number]                [place], [place] [address]     If you have any questions or concerns, please don't hesitate to call.  Sincerely,   Molli Barrows, FNP

## 2016-04-09 NOTE — Telephone Encounter (Signed)
I think the patient may be a little confused about her prescription.  I had sent over a potassium prescription for her to take over a week ago due to a low potassium. She returned to the office on 04/07/16 for potassium recheck.  Her potassium recheck was in normal range of 3.8. She doesn't require any additional potassium replacement.  Thanks  Molli Barrows FNP-C

## 2016-04-09 NOTE — Telephone Encounter (Signed)
Patient is calling back because she never got the refill for the potassium pills. She states she was told to come back for her potassium and she was seen on 10/4. Patient would like her medication today. Please advise!

## 2016-04-12 ENCOUNTER — Telehealth: Payer: Self-pay | Admitting: Emergency Medicine

## 2016-04-12 NOTE — Telephone Encounter (Signed)
Pt is aware she does need potassium replacement due to normal recheck range.

## 2016-04-14 ENCOUNTER — Other Ambulatory Visit: Payer: Self-pay | Admitting: Oncology

## 2016-05-13 ENCOUNTER — Telehealth: Payer: Self-pay

## 2016-05-13 ENCOUNTER — Telehealth: Payer: Self-pay | Admitting: Family Medicine

## 2016-05-13 ENCOUNTER — Ambulatory Visit (INDEPENDENT_AMBULATORY_CARE_PROVIDER_SITE_OTHER): Payer: Medicare Other | Admitting: Family Medicine

## 2016-05-13 ENCOUNTER — Emergency Department (HOSPITAL_COMMUNITY)
Admission: EM | Admit: 2016-05-13 | Discharge: 2016-05-13 | Disposition: A | Discharge status: Home / Self Care | Payer: Medicare Other | Attending: Emergency Medicine | Admitting: Emergency Medicine

## 2016-05-13 VITALS — BP 100/58 | HR 60 | Temp 98.2°F | Resp 16 | Wt 169.6 lb

## 2016-05-13 DIAGNOSIS — Z96643 Presence of artificial hip joint, bilateral: Secondary | ICD-10-CM | POA: Insufficient documentation

## 2016-05-13 DIAGNOSIS — N3000 Acute cystitis without hematuria: Secondary | ICD-10-CM

## 2016-05-13 DIAGNOSIS — Z87891 Personal history of nicotine dependence: Secondary | ICD-10-CM | POA: Diagnosis not present

## 2016-05-13 DIAGNOSIS — J449 Chronic obstructive pulmonary disease, unspecified: Secondary | ICD-10-CM | POA: Insufficient documentation

## 2016-05-13 DIAGNOSIS — N390 Urinary tract infection, site not specified: Secondary | ICD-10-CM | POA: Diagnosis present

## 2016-05-13 DIAGNOSIS — Z7984 Long term (current) use of oral hypoglycemic drugs: Secondary | ICD-10-CM | POA: Insufficient documentation

## 2016-05-13 DIAGNOSIS — I959 Hypotension, unspecified: Secondary | ICD-10-CM | POA: Insufficient documentation

## 2016-05-13 DIAGNOSIS — Z79899 Other long term (current) drug therapy: Secondary | ICD-10-CM | POA: Insufficient documentation

## 2016-05-13 DIAGNOSIS — Z853 Personal history of malignant neoplasm of breast: Secondary | ICD-10-CM | POA: Diagnosis not present

## 2016-05-13 DIAGNOSIS — E119 Type 2 diabetes mellitus without complications: Secondary | ICD-10-CM | POA: Diagnosis not present

## 2016-05-13 DIAGNOSIS — R251 Tremor, unspecified: Secondary | ICD-10-CM

## 2016-05-13 LAB — COMPLETE METABOLIC PANEL WITH GFR
ALBUMIN: 3.5 g/dL — AB (ref 3.6–5.1)
ALK PHOS: 172 U/L — AB (ref 33–130)
ALT: 53 U/L — ABNORMAL HIGH (ref 6–29)
AST: 79 U/L — ABNORMAL HIGH (ref 10–35)
BUN: 11 mg/dL (ref 7–25)
CALCIUM: 9.4 mg/dL (ref 8.6–10.4)
CO2: 30 mmol/L (ref 20–31)
Chloride: 92 mmol/L — ABNORMAL LOW (ref 98–110)
Creat: 1.14 mg/dL — ABNORMAL HIGH (ref 0.60–0.93)
GFR, EST AFRICAN AMERICAN: 56 mL/min — AB (ref >=60)
GFR, EST NON AFRICAN AMERICAN: 48 mL/min — AB (ref >=60)
GLUCOSE: 171 mg/dL — AB (ref 65–99)
POTASSIUM: 3 mmol/L — AB (ref 3.5–5.3)
SODIUM: 134 mmol/L — AB (ref 135–146)
TOTAL PROTEIN: 6.1 g/dL (ref 6.1–8.1)
Total Bilirubin: 3.2 mg/dL — ABNORMAL HIGH (ref 0.2–1.2)

## 2016-05-13 LAB — BILIRUBIN, TOTAL: BILIRUBIN TOTAL: 3.3 mg/dL — AB (ref 0.2–1.2)

## 2016-05-13 LAB — POCT CBC
Granulocyte percent: 42.8 %G (ref 37–80)
HEMATOCRIT: 34.9 % — AB (ref 37.7–47.9)
HEMOGLOBIN: 12.2 g/dL (ref 12.2–16.2)
LYMPH, POC: 2.3 (ref 0.6–3.4)
MCH, POC: 35.3 pg — AB (ref 27–31.2)
MCHC: 34.9 g/dL (ref 31.8–35.4)
MCV: 101.2 fL — AB (ref 80–97)
MID (cbc): 0.8 (ref 0–0.9)
MPV: 7.3 fL (ref 0–99.8)
POC GRANULOCYTE: 2.3 (ref 2–6.9)
POC LYMPH %: 43.3 % (ref 10–50)
POC MID %: 13.9 % — AB (ref 0–12)
Platelet Count, POC: 239 10*3/uL (ref 142–424)
RBC: 3.44 M/uL — AB (ref 4.04–5.48)
RDW, POC: 22 %
WBC: 5.4 10*3/uL (ref 4.6–10.2)

## 2016-05-13 LAB — POCT URINALYSIS DIP (MANUAL ENTRY)
Glucose, UA: NEGATIVE
LEUKOCYTES UA: NEGATIVE
NITRITE UA: POSITIVE — AB
PH UA: 5.5
RBC UA: NEGATIVE
Spec Grav, UA: 1.02
UROBILINOGEN UA: 1

## 2016-05-13 LAB — POC MICROSCOPIC URINALYSIS (UMFC): Mucus: ABSENT

## 2016-05-13 LAB — GLUCOSE, POCT (MANUAL RESULT ENTRY): POC GLUCOSE: 162 mg/dL — AB (ref 70–99)

## 2016-05-13 LAB — MAGNESIUM: MAGNESIUM: 1.5 mg/dL (ref 1.5–2.5)

## 2016-05-13 MED ORDER — CIPROFLOXACIN HCL 500 MG PO TABS: 500.0000 mg | ORAL_TABLET | Freq: Two times a day (BID) | ORAL | 0 refills | Status: DC

## 2016-05-13 MED ORDER — CEPHALEXIN 500 MG PO CAPS: 500.0000 mg | ORAL_CAPSULE | Freq: Two times a day (BID) | ORAL | 0 refills | Status: AC

## 2016-05-13 MED ORDER — CEPHALEXIN 250 MG PO CAPS
500.0000 mg | ORAL_CAPSULE | Freq: Once | ORAL | Status: AC
  Administered 2016-05-13: 500 mg via ORAL
  Filled 2016-05-13: qty 2

## 2016-05-13 MED ORDER — ATENOLOL 50 MG PO TABS: 100.0000 mg | ORAL_TABLET | Freq: Every day | ORAL | 3 refills | Status: DC

## 2016-05-13 NOTE — Telephone Encounter (Signed)
Spoke with charge nurse in the ED and provided information regarding why I referred the patient to the ED and her presentation and labs from her visit this morning.  Carroll Sage. Kenton Kingfisher, MSN, FNP-C [facility]

## 2016-05-13 NOTE — ED Notes (Signed)
Pt ambulated to restroom independently to give urine specimen

## 2016-05-13 NOTE — ED Provider Notes (Signed)
[place] DEPT Provider Note   CSN: [id number] Arrival date & time: 05/13/16  1733     History   Chief Complaint Chief Complaint  Patient presents with  . Hypotension    HPI [name] is a 70 y.o. female with history of type 2 diabetes, hypertension, presents to the emergency department referred by urgent care clinic where she was seen this morning for evaluation of intermittent chills since Monday. She was instructed to present to the emergency department for further evaluation after she was found to have slightly low blood pressure while in clinic today. She was called from home several hours after she was seen this morning. The patient states "I don't know why I'm here, I feel fine." On reviewing the labs and urinalysis that was done, she was found to have evidence suggestive of urinary tract infection. She notes a history of recent darkening of her urine but denies any dysuria, urinary urgency, urinary frequency, fever, chills, chest pain, cough, shortness of breath, nausea, vomiting, diarrhea, lightheadedness or headache. When she was being evaluated in the urgent care clinic she was advised to decrease her atenolol dose down from 2 pills to 1 (50mg ) daily. She was prescribed a course of ciprofloxacin but she states that she has not gotten a chance to go by the drugstore yet and has not started it.   HPI  Past Medical History:  Diagnosis Date  . Allergy   . Anxiety   . 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 Asthma   . Breast cancer ([place])    S/P left mastectomy 2015  . Depression   . Hypertension   . Migraine 1980's  . Type II diabetes mellitus [facility])     Patient Active Problem List   Diagnosis Date Noted  . Weakness 09/10/2015  . Hypokalemia 09/10/2015  . COPD exacerbation ([place]) 09/10/2015  . Hypomagnesemia 09/10/2015  . Gout 08/20/2015  . Skin flap necrosis ([place]) 01/17/2014  . Breast cancer of upper-inner quadrant of left female breast ([place]) 11/16/2013  .  Abnormal LFTs--hep c neg 08/24/2013  . HNP (herniated nucleus pulposus), thoracic 07/17/2013  . History of tobacco use 05/24/2013  . Depression with anxiety 05/24/2013  . Vitamin D deficiency 05/24/2013  . Venous insufficiency 05/24/2013  . H/O bilateral hip replacements 01/16/2013  . BMI 36.0-36.9,adult 11/19/2011  . DM (diabetes mellitus) ([place]) 11/19/2011  . HTN (hypertension) 11/19/2011  . Asthma 11/19/2011    Past Surgical History:  Procedure Laterality Date  . ABDOMINAL HYSTERECTOMY  1981   "fibroids"  . ANKLE FRACTURE SURGERY Left 03/15/2000   with hardware  . APPLICATION OF WOUND VAC  01/17/2014   w/chest wall debridement  . APPLICATION OF WOUND VAC Left 01/17/2014   Procedure: PLACEMENT OF VAC DRESSING;  Surgeon: Merrie Roof, MD;  Location: [place];  Service: General;  Laterality: Left;  . BREAST SURGERY Left    removed due to BCA  . COLONOSCOPY    . JOINT REPLACEMENT    . MASTECTOMY COMPLETE / SIMPLE W/ SENTINEL NODE BIOPSY Left   . MASTECTOMY W/ SENTINEL NODE BIOPSY Left 12/20/2013   Procedure: MASTECTOMY WITH SENTINEL LYMPH NODE BIOPSY;  Surgeon: Merrie Roof, MD;  Location: [place];  Service: General;  Laterality: Left;  . ORIF SHOULDER FRACTURE Left 1964  . ROTATOR CUFF REPAIR Right   . TOTAL HIP ARTHROPLASTY Left   . TOTAL HIP ARTHROPLASTY Right   . TUBAL LIGATION  1973  . WOUND  DEBRIDEMENT Left 01/17/2014   Procedure: DEBRIDEMENT OF CHEST WALL;  Surgeon: Merrie Roof, MD;  Location: [place];  Service: General;  Laterality: Left;    OB History    No data available       Home Medications    Prior to Admission medications   Medication Sig Start Date End Date Taking? Authorizing Provider  albuterol (PROVENTIL HFA;VENTOLIN HFA) 108 (90 BASE) MCG/ACT inhaler Inhale 1-2 puffs into the lungs every 4 (four) hours as needed for wheezing or shortness of breath.   Yes Historical Provider, MD  anastrozole (ARIMIDEX) 1 MG tablet TAKE 1 TABLET (1 MG TOTAL) BY MOUTH  DAILY. 04/14/16  Yes Wyatt Portela, MD  atenolol (TENORMIN) 50 MG tablet Take 2 tablets (100 mg total) by mouth daily. Decease 50 x 3 days and then discontinue 05/13/16  Yes Sedalia Muta, FNP  Cholecalciferol (VITAMIN D PO) Take 1 capsule by mouth daily.   Yes Historical Provider, MD  indomethacin (INDOCIN) 50 MG capsule Take 1 capsule (50 mg total) by mouth 3 (three) times daily as needed for moderate pain. 03/30/16  Yes Sedalia Muta, FNP  magnesium oxide (MAG-OX) 400 MG tablet Take 1 tablet (400 mg total) by mouth 2 (two) times daily. For 10 days Patient taking differently: Take 400 mg by mouth daily as needed (takes every couple of days). For 10 days 09/12/15  Yes Bonnielee Haff, MD  metFORMIN (GLUCOPHAGE XR) 500 MG 24 hr tablet Take 1 tablet (500 mg total) by mouth daily. 03/30/16 03/30/17 Yes Sedalia Muta, FNP  vitamin B-12 (CYANOCOBALAMIN) 1000 MCG tablet Take 1 tablet (1,000 mcg total) by mouth daily. 09/12/15  Yes Bonnielee Haff, MD  ALPRAZolam Duanne Moron) 0.5 MG tablet Take 1 tablet (0.5 mg total) by mouth 2 (two) times daily as needed for anxiety. 05/14/16   Sedalia Muta, FNP  cephALEXin (KEFLEX) 500 MG capsule Take 1 capsule (500 mg total) by mouth 2 (two) times daily. 05/13/16 05/20/16  Zenovia Jarred, DO  ciprofloxacin (CIPRO) 500 MG tablet Take 1 tablet (500 mg total) by mouth 2 (two) times daily. 05/13/16   Sedalia Muta, FNP  potassium chloride (K-DUR) 10 MEQ tablet Take 1 tablet (10 mEq total) by mouth daily. Patient not taking: Reported on 05/13/2016 01/16/16   Jaynee Eagles, PA-C  potassium chloride 20 MEQ TBCR Take 40 mEq by mouth 2 (two) times daily. 05/14/16 05/17/16  Sedalia Muta, FNP    Family History Family History  Problem Relation Age of Onset  . Asthma Mother   . Stroke Mother   . Asthma Father   . Alcohol abuse Father   . Colon cancer Neg Hx     Social History Social History  Substance Use Topics  .  Smoking status: Former Smoker    Packs/day: 0.50    Years: 40.00    Types: Cigarettes    Quit date: 11/11/2000  . Smokeless tobacco: Never Used  . Alcohol use 0.6 - 1.2 oz/week    1 - 2 Shots of liquor per week     Comment: weekly      Allergies   Ace inhibitors and Angiotensin receptor blockers   Review of Systems Review of Systems  Constitutional: Positive for chills. Negative for activity change, appetite change, fatigue and fever.  HENT: Negative for congestion, rhinorrhea, sinus pain, sinus pressure, sneezing and sore throat.   Respiratory: Negative for cough, chest tightness and shortness of breath.   Cardiovascular: Negative for chest  pain and palpitations.  Gastrointestinal: Negative for abdominal pain, blood in stool, diarrhea, nausea and vomiting.  Genitourinary: Negative for difficulty urinating, dysuria, flank pain, frequency, hematuria and urgency.  Musculoskeletal: Negative for back pain and neck pain.  Skin: Negative for rash.  Neurological: Negative for dizziness, syncope, weakness, light-headedness, numbness and headaches.  All other systems reviewed and are negative.    Physical Exam Updated Vital Signs BP 133/64   Pulse 66   Temp 98 F (36.7 C) (Oral)   Resp 16   SpO2 100%   Physical Exam  Constitutional: She is oriented to person, place, and time. She appears well-developed and well-nourished. No distress.  HENT:  Head: Normocephalic and atraumatic.  Nose: Nose normal.  Mouth/Throat: Oropharynx is clear and moist.  Eyes: Conjunctivae and EOM are normal. Pupils are equal, round, and reactive to light.  Neck: Neck supple.  Cardiovascular: Normal rate, regular rhythm, normal heart sounds and intact distal pulses.   Pulmonary/Chest: Effort normal and breath sounds normal.  Abdominal: Soft. She exhibits no distension. There is no tenderness. There is no rigidity, no guarding and no CVA tenderness.  Musculoskeletal: She exhibits no edema or tenderness.    Neurological: She is alert and oriented to person, place, and time. No cranial nerve deficit. Coordination normal.  Skin: Skin is warm and dry. No rash noted. She is not diaphoretic.  Nursing note and vitals reviewed.    ED Treatments / Results  Labs (all labs ordered are listed, but only abnormal results are displayed) Labs Reviewed  URINE CULTURE    EKG  EKG Interpretation None       Radiology No results found.  Procedures Procedures (including critical care time)  Medications Ordered in ED Medications  cephALEXin (KEFLEX) capsule 500 mg (500 mg Oral Given 05/13/16 2156)     Initial Impression / Assessment and Plan / ED Course  I have reviewed the triage vital signs and the nursing notes.  Pertinent labs & imaging results that were available during my care of the patient were reviewed by me and considered in my medical decision making (see chart for details).  Clinical Course   70 y.o. female with newly diagnosed UTI presents to the ED as she was instructed after she was found to have mildly soft blood pressures this morning in UC clinic. Records reviewed. At that time, it was felt that her blood pressure abnormalities were likely due to her atenolol. She was advised to cut her atenolol dosing in half. She currently has no complaints. Here her blood pressure is normal at 133/64, AF, with normal pulse and O2. She was rx'd cipro this AM, but has not started it yet. Given the fact that cipro is no longer first line abx for UTI, will switch to keflex and start in the ED. Urine was collected and sent for culture. She was recommended to follow up with her PCP in a few days for further evaluation of her blood pressure and her UTI. Return precautions were given.   Final Clinical Impressions(s) / ED Diagnoses   Final diagnoses:  Acute cystitis without hematuria    New Prescriptions Discharge Medication List as of 05/13/2016 10:07 PM    START taking these medications    Details  cephALEXin (KEFLEX) 500 MG capsule Take 1 capsule (500 mg total) by mouth 2 (two) times daily., Starting Thu 05/13/2016, Until Thu 05/20/2016, [place], DO 05/14/16 1334    [address],  MD 05/14/16 1733

## 2016-05-13 NOTE — ED Triage Notes (Signed)
Pt sent from PCP for evaluation of hypotension. BP at doctors office today at [place] was 100/58. BP in triage 117/50. Had blood work done today already at PCPs office.

## 2016-05-13 NOTE — Patient Instructions (Addendum)
Decrease Atenolol  To 50 mg once daily times 3 days then stop taking medication.  I am referring you to neurology for further evaluation.  I am starting you on Ciprofloxacin 500 mg twice  for urinary tract infection.    I will contact you regarding the results of your lab results.   IF you received an x-ray today, you will receive an invoice from [facility]. Please contact [facility] Radiology at [phone] with questions or concerns regarding your invoice.   IF you received labwork today, you will receive an invoice from [employer]. Please contact [facility] at [phone] with questions or concerns regarding your invoice.   Our billing staff will not be able to assist you with questions regarding bills from these companies.  You will be contacted with the lab results as soon as they are available. The fastest way to get your results is to activate your My Chart account. Instructions are located on the last page of this paperwork. If you have not heard from [place] regarding the results in 2 weeks, please contact this office.

## 2016-05-13 NOTE — Telephone Encounter (Signed)
Patient called and stated that someone had called her.  I read the message from Molli Barrows to her.  She asked did that mean she has to be admitted to the hospital.  I told her that [facility] is not admitting her, but she needs to be checked out by the ED staff.  Admission will be up to what they discover in the ED.  She stated that she will go to [facility] and be seen.

## 2016-05-13 NOTE — Progress Notes (Signed)
Patient ID: [name], female    DOB: 25-May-1946, 70 y.o.   MRN: [id number]  PCP: Molli Barrows, FNP  Chief Complaint  Patient presents with  . Fatigue    feeling weak/near fainting in store last night/shakey    Subjective:   HPI 70 year old female presents for evaluation of trembling time 5 days. She is an established patient here at [facility] and is accompanied today by her daughter. Reports since Monday episodes of involuntary shaking without chills and feelings of weakness. She checks her blood sugars daily and denies readings below 100. Reports the shaking has continued and she feels like she is becoming ill without symptoms. Patient reports noticing her urine has become increasing darker in spite of drinking plenty of water.  Denies cough, diarrhea, nausea, shortness of breath, chest pain, palpitations, or diarrhea. Patient reports no recent alcohol intake. She drinks alcoholic beverages at least 2-3 drinks weekly.  Social History   Social History  . Marital status: Divorced    Spouse name: n/a  . Number of children: 1  . Years of education: 56   Occupational History  . retired     Network engineer   Social History Main Topics  . Smoking status: Former Smoker    Packs/day: 0.50    Years: 40.00    Types: Cigarettes    Quit date: 11/11/2000  . Smokeless tobacco: Never Used  . Alcohol use 0.6 - 1.2 oz/week    1 - 2 Shots of liquor per week     Comment: weekly   . Drug use:     Types: Marijuana     Comment: "last smoked marijuana in ~ 2010; recreational user only"  . Sexual activity: No   Other Topics Concern  . Not on file   Social History Narrative   Her daughter and granddaughter live with her.   Family History  Problem Relation Age of Onset  . Asthma Mother   . Stroke Mother   . Asthma Father   . Alcohol abuse Father   . Colon cancer Neg Hx      Review of Systems See HPI    Patient Active Problem List   Diagnosis Date Noted  .  Weakness 09/10/2015  . Hypokalemia 09/10/2015  . COPD exacerbation ([place]) 09/10/2015  . Hypomagnesemia 09/10/2015  . Gout 08/20/2015  . Skin flap necrosis ([place]) 01/17/2014  . Breast cancer of upper-inner quadrant of left female breast ([place]) 11/16/2013  . Abnormal LFTs--hep c neg 08/24/2013  . HNP (herniated nucleus pulposus), thoracic 07/17/2013  . History of tobacco use 05/24/2013  . Depression with anxiety 05/24/2013  . Vitamin D deficiency 05/24/2013  . Venous insufficiency 05/24/2013  . H/O bilateral hip replacements 01/16/2013  . BMI 36.0-36.9,adult 11/19/2011  . DM (diabetes mellitus) ([place]) 11/19/2011  . HTN (hypertension) 11/19/2011  . Asthma 11/19/2011     Prior to Admission medications   Medication Sig Start Date End Date Taking? Authorizing Provider  albuterol (PROVENTIL HFA;VENTOLIN HFA) 108 (90 BASE) MCG/ACT inhaler Inhale 1-2 puffs into the lungs every 4 (four) hours as needed for wheezing or shortness of breath.   Yes Historical Provider, MD  ALPRAZolam Duanne Moron) 1 MG tablet Take 1 tablet (1 mg total) by mouth 2 (two) times daily as needed for anxiety. 06/01/16 07/01/16 Yes Sedalia Muta, FNP  anastrozole (ARIMIDEX) 1 MG tablet TAKE 1 TABLET (1 MG TOTAL) BY MOUTH DAILY. 04/14/16  Yes Wyatt Portela, MD  atenolol (TENORMIN) 50 MG  tablet Take 2 tablets (100 mg total) by mouth daily. May resume after 3 days 03/30/16  Yes Sedalia Muta, FNP  glucose blood (ONE TOUCH ULTRA TEST) test strip Test blood sugar once daily. Dx: E11.9 03/10/16  Yes Chelle Jeffery, PA-C  indomethacin (INDOCIN) 50 MG capsule Take 1 capsule (50 mg total) by mouth 3 (three) times daily as needed for moderate pain. 03/30/16  Yes Sedalia Muta, FNP  metFORMIN (GLUCOPHAGE XR) 500 MG 24 hr tablet Take 1 tablet (500 mg total) by mouth daily. 03/30/16 03/30/17 Yes Sedalia Muta, FNP  sulfacetamide-prednisoLONE (VASOCIDIN) 10-0.23 % ophthalmic solution Place 1 drop into  both eyes daily as needed (allergies).  10/18/13  Yes Historical Provider, MD  Cholecalciferol (VITAMIN D PO) Take 1 capsule by mouth daily.    Historical Provider, MD  magnesium oxide (MAG-OX) 400 (241.3 Mg) MG tablet As directed 09/19/15   Historical Provider, MD  magnesium oxide (MAG-OX) 400 MG tablet Take 1 tablet (400 mg total) by mouth 2 (two) times daily. For 10 days Patient not taking: Reported on 05/13/2016 09/12/15   Bonnielee Haff, MD  potassium chloride (K-DUR) 10 MEQ tablet Take 1 tablet (10 mEq total) by mouth daily. Patient not taking: Reported on 05/13/2016 01/16/16   Jaynee Eagles, PA-C  potassium chloride SA (K-DUR,KLOR-CON) 20 MEQ tablet Take 1 tablet (20 mEq total) by mouth 4 (four) times daily. 04/01/16 04/04/16  Sedalia Muta, FNP  vitamin B-12 (CYANOCOBALAMIN) 1000 MCG tablet Take 1 tablet (1,000 mcg total) by mouth daily. Patient not taking: Reported on 05/13/2016 09/12/15   Bonnielee Haff, MD     Allergies  Allergen Reactions  . Ace Inhibitors Other (See Comments)    Acute renal failure  . Angiotensin Receptor Blockers Other (See Comments)    Acute Renal Failure       Objective:  Physical Exam  Constitutional: She is oriented to person, place, and time. She appears well-developed and well-nourished.  HENT:  Head: Normocephalic and atraumatic.  Right Ear: External ear normal.  Left Ear: External ear normal.  Mouth/Throat: Oropharynx is clear and moist.  Eyes: Conjunctivae and EOM are normal. Pupils are equal, round, and reactive to light.  Neck: Normal range of motion. Neck supple.  Cardiovascular: Normal rate, regular rhythm, normal heart sounds and intact distal pulses.   + orthostatic blood pressure readings  Pulmonary/Chest: Effort normal and breath sounds normal.  Musculoskeletal: Normal range of motion.  Lymphadenopathy:    She has no cervical adenopathy.  Neurological: She is alert and oriented to person, place, and time. She has normal strength.  She displays a negative Romberg sign.  Involuntary shaking -bilateral extremities and upper body Bilateral 5/5 hand grips  Cerebellar function intact. Flat/mask like facial appearance Delayed responses   Skin: Skin is warm and dry.  Psychiatric: She has a normal mood and affect. Her behavior is normal. Judgment and thought content normal.    Vitals:   05/13/16 0829  BP: (!) 108/52  Pulse: 79  Resp: 16  Temp: 98.2 F (36.8 C)   Assessment & Plan:  1. Involuntary trembling - POCT CBC - POCT glucose (manual entry) - POCT urinalysis dipstick - POCT Microscopic Urinalysis ([facility]) - COMPLETE METABOLIC PANEL WITH GFR - Bilirubin, total - Magnesium - Ambulatory referral to Neurology  2. Acute cystitis without hematuria Plan: Ciprofloxacin 500 mg twice daily for 5 days.  70 year old female, presents with acute onset of involuntary shaking times 4 days. Involuntary movement are distributed bilaterally  to the upper body and are non-related to sensation of chills. Patient is not currently or in the recent past taken any anti-psychotic medication nor is there any known family history of movement or parkinsonian like disorders. She has a history of hypokalemia and hypomagnesemia, and I have ordered these labs.  I'm also checking a bilirubin level to rule out any underlying liver involvement as the patient drinks alcohol regularly. Referring to neurology for further work-up. Suspect underlying neurological cause for persistent bilateral upper body tremors. Treated for symptoms related to an acute UTI.  Tapering patient off atenolol due she is positive for  orthostatic hypertension and pressures are running significantly low. She is to return for follow-up in 4 weeks.  -Consulted with Dr. Mervyn Gay regarding the above plan.  Will follow-up with lab results.  Carroll Sage. Kenton Kingfisher, MSN, FNP-C [facility] [place] [facility]

## 2016-05-13 NOTE — Telephone Encounter (Signed)
Spoke with patient and advised due to her elevated bilirubin, low potassium, and renal function which indicates worsening acute renal failure, I recommend that she be admitted to the emergency department.  She ask that I contact her Granddaughter to pick her up as her daughter lives out of town and returned to [place].  Called granddaughter Andee Poles at [phone] and left a message to return my call on my personal cell phone.  Carroll Sage. Kenton Kingfisher, MSN, FNP-C [facility]

## 2016-05-13 NOTE — ED Notes (Signed)
Pt verbalized understanding of d/c instructions and has no further questions. Pt sent home with keflex and her daughter is driving. Pt stable and NAD. VSS. BP is normal.

## 2016-05-14 ENCOUNTER — Other Ambulatory Visit: Payer: Self-pay | Admitting: Family Medicine

## 2016-05-14 ENCOUNTER — Telehealth: Payer: Self-pay

## 2016-05-14 MED ORDER — POTASSIUM CHLORIDE ER 20 MEQ PO TBCR: 40.0000 meq | EXTENDED_RELEASE_TABLET | Freq: Two times a day (BID) | ORAL | 0 refills | Status: DC

## 2016-05-14 NOTE — Progress Notes (Signed)
Spoke with pt.  Gave message per [place].  Gave pt appt for Monday 11/13 @ 9am  Pt will increase water, avoid Tylenol & alcohol & take Kcl with food.

## 2016-05-14 NOTE — Telephone Encounter (Signed)
Spoke with pt per Kimberly's message.  Pt has appt Monday 11/13 for follow up  Pt verbalized understanding to avoid Tylenol, alcohol & take the Kcl tabs (2 tabs 8meq @ for total of 24meq0 2 times per day x 3 days.  Total of 6 tabs sent.  Pt will have picked up today.

## 2016-05-14 NOTE — Progress Notes (Signed)
Patient discharged from the emergency department without evaluation of potassium and receiving treatment for underlying AKI.Treating potassium insufficiency with oral replacement and increasing hydration. Will have patient return on 05/17/16 to recheck her electrolytes and renal function.   Please contact patient to advise to pick-up prescription for Potassium and take as follows: 40 MEQ of potassium twice daily for 3 days. She will need to return on 05/17/16 for a repeat CMP. Advise her to increase water intake and avoid any tylenol or alcohol as her liver function test are elevated. Thanks,  Joelene Millin Jaikob Borgwardt-FNP-C

## 2016-05-14 NOTE — Telephone Encounter (Signed)
Faxed Alprazolam rx to [facility]

## 2016-05-16 LAB — URINE CULTURE

## 2016-05-17 ENCOUNTER — Telehealth (HOSPITAL_BASED_OUTPATIENT_CLINIC_OR_DEPARTMENT_OTHER): Payer: Self-pay | Admitting: Emergency Medicine

## 2016-05-17 ENCOUNTER — Ambulatory Visit (INDEPENDENT_AMBULATORY_CARE_PROVIDER_SITE_OTHER): Payer: Medicare Other | Admitting: Family Medicine

## 2016-05-17 VITALS — BP 114/70 | HR 81 | Temp 98.1°F | Resp 18 | Ht 63.0 in | Wt 174.6 lb

## 2016-05-17 DIAGNOSIS — R300 Dysuria: Secondary | ICD-10-CM | POA: Diagnosis not present

## 2016-05-17 DIAGNOSIS — N179 Acute kidney failure, unspecified: Secondary | ICD-10-CM

## 2016-05-17 DIAGNOSIS — E876 Hypokalemia: Secondary | ICD-10-CM | POA: Diagnosis not present

## 2016-05-17 LAB — CBC WITH DIFFERENTIAL/PLATELET
BASOS PCT: 1 %
Basophils Absolute: 62 cells/uL (ref 0–200)
EOS ABS: 124 {cells}/uL (ref 15–500)
Eosinophils Relative: 2 %
HCT: 35 % (ref 35.0–45.0)
Hemoglobin: 11.1 g/dL — ABNORMAL LOW (ref 11.7–15.5)
LYMPHS PCT: 40 %
Lymphs Abs: 2480 cells/uL (ref 850–3900)
MCH: 34 pg — AB (ref 27.0–33.0)
MCHC: 31.7 g/dL — AB (ref 32.0–36.0)
MCV: 107.4 fL — ABNORMAL HIGH (ref 80.0–100.0)
MONO ABS: 620 {cells}/uL (ref 200–950)
MPV: 8.8 fL (ref 7.5–12.5)
Monocytes Relative: 10 %
NEUTROS ABS: 2914 {cells}/uL (ref 1500–7800)
Neutrophils Relative %: 47 %
Platelets: 307 10*3/uL (ref 140–400)
RBC: 3.26 MIL/uL — AB (ref 3.80–5.10)
RDW: 20.3 % — ABNORMAL HIGH (ref 11.0–15.0)
WBC: 6.2 10*3/uL (ref 3.8–10.8)

## 2016-05-17 LAB — COMPLETE METABOLIC PANEL WITH GFR
ALT: 46 U/L — ABNORMAL HIGH (ref 6–29)
AST: 68 U/L — ABNORMAL HIGH (ref 10–35)
Albumin: 3.6 g/dL (ref 3.6–5.1)
Alkaline Phosphatase: 136 U/L — ABNORMAL HIGH (ref 33–130)
BUN: 11 mg/dL (ref 7–25)
CALCIUM: 9.6 mg/dL (ref 8.6–10.4)
CHLORIDE: 102 mmol/L (ref 98–110)
CO2: 32 mmol/L — AB (ref 20–31)
CREATININE: 0.78 mg/dL (ref 0.60–0.93)
GFR, EST AFRICAN AMERICAN: 88 mL/min (ref >=60)
GFR, EST NON AFRICAN AMERICAN: 77 mL/min (ref >=60)
Glucose, Bld: 127 mg/dL — ABNORMAL HIGH (ref 65–99)
POTASSIUM: 4.8 mmol/L (ref 3.5–5.3)
Sodium: 138 mmol/L (ref 135–146)
Total Bilirubin: 1.9 mg/dL — ABNORMAL HIGH (ref 0.2–1.2)
Total Protein: 6 g/dL — ABNORMAL LOW (ref 6.1–8.1)

## 2016-05-17 LAB — POCT URINALYSIS DIP (MANUAL ENTRY)
Bilirubin, UA: NEGATIVE
GLUCOSE UA: NEGATIVE
Ketones, POC UA: NEGATIVE
Leukocytes, UA: NEGATIVE
Nitrite, UA: NEGATIVE
PROTEIN UA: NEGATIVE
SPEC GRAV UA: 1.015
UROBILINOGEN UA: 1
pH, UA: 7

## 2016-05-17 LAB — POC MICROSCOPIC URINALYSIS (UMFC): MUCUS RE: ABSENT

## 2016-05-17 NOTE — Progress Notes (Signed)
Patient ID: [name], female    DOB: [birth year], 70 y.o.   MRN: [id number]  PCP: Molli Barrows, FNP  Chief Complaint  Patient presents with  . Follow-up    hospital follow up    Subjective:   HPI 70 year old female presents for evaluation of a hospital follow-up. She was previously seen and evaluated for involuntary trembling on 05/13/16. Following that visit, patient was deferred to the ED due her labs indicated acute renal failure, elevated liver enzymes, and hypokalemia.  During her visit at the [facility] Emergency department, these medical problems were not addressed and she was discharged with Keflex to treat her urinary tract infection. She presents today for re-evaluation of hypokalemia, liver enzymes, and renal function. Patient reports cessation of involuntary tremors, completing potassium tablets as prescribed, and reports feeling significantly improved compared to last visit. Denies dizziness, shortness of breath, or chest pain.  Social History   Social History  . Marital status: Divorced    Spouse name: n/a  . Number of children: 1  . Years of education: 29   Occupational History  . retired     Network engineer   Social History Main Topics  . Smoking status: Former Smoker    Packs/day: 0.50    Years: 40.00    Types: Cigarettes    Quit date: 11/11/2000  . Smokeless tobacco: Never Used  . Alcohol use 0.6 - 1.2 oz/week    1 - 2 Shots of liquor per week     Comment: weekly   . Drug use:     Types: Marijuana     Comment: "last smoked marijuana in ~ 2010; recreational user only"  . Sexual activity: No   Other Topics Concern  . Not on file   Social History Narrative   Her daughter and granddaughter live with her.    Family History  Problem Relation Age of Onset  . Asthma Mother   . Stroke Mother   . Asthma Father   . Alcohol abuse Father   . Colon cancer Neg Hx     Review of Systems See HPI    Patient Active Problem List   Diagnosis Date Noted  . Weakness 09/10/2015  . Hypokalemia 09/10/2015  . COPD exacerbation ([place]) 09/10/2015  . Hypomagnesemia 09/10/2015  . Gout 08/20/2015  . Skin flap necrosis ([place]) 01/17/2014  . Breast cancer of upper-inner quadrant of left female breast ([place]) 11/16/2013  . Abnormal LFTs--hep c neg 08/24/2013  . HNP (herniated nucleus pulposus), thoracic 07/17/2013  . History of tobacco use 05/24/2013  . Depression with anxiety 05/24/2013  . Vitamin D deficiency 05/24/2013  . Venous insufficiency 05/24/2013  . H/O bilateral hip replacements 01/16/2013  . BMI 36.0-36.9,adult 11/19/2011  . DM (diabetes mellitus) ([place]) 11/19/2011  . HTN (hypertension) 11/19/2011  . Asthma 11/19/2011     Prior to Admission medications   Medication Sig Start Date End Date Taking? Authorizing Provider  albuterol (PROVENTIL HFA;VENTOLIN HFA) 108 (90 BASE) MCG/ACT inhaler Inhale 1-2 puffs into the lungs every 4 (four) hours as needed for wheezing or shortness of breath.   Yes Historical Provider, MD  ALPRAZolam Duanne Moron) 0.5 MG tablet Take 1 tablet (0.5 mg total) by mouth 2 (two) times daily as needed for anxiety. 05/14/16  Yes Sedalia Muta, FNP  anastrozole (ARIMIDEX) 1 MG tablet TAKE 1 TABLET (1 MG TOTAL) BY MOUTH DAILY. 04/14/16  Yes Wyatt Portela, MD  atenolol (TENORMIN) 50 MG tablet Take 2 tablets (  100 mg total) by mouth daily. Decease 50 x 3 days and then discontinue 05/13/16  Yes Sedalia Muta, FNP  cephALEXin (KEFLEX) 500 MG capsule Take 1 capsule (500 mg total) by mouth 2 (two) times daily. 05/13/16 05/20/16 Yes Zenovia Jarred, DO  Cholecalciferol (VITAMIN D PO) Take 1 capsule by mouth daily.   Yes Historical Provider, MD  ciprofloxacin (CIPRO) 500 MG tablet Take 1 tablet (500 mg total) by mouth 2 (two) times daily. 05/13/16  Yes Sedalia Muta, FNP  indomethacin (INDOCIN) 50 MG capsule Take 1 capsule (50 mg total) by mouth 3 (three) times daily as needed for  moderate pain. 03/30/16  Yes Sedalia Muta, FNP  metFORMIN (GLUCOPHAGE XR) 500 MG 24 hr tablet Take 1 tablet (500 mg total) by mouth daily. 03/30/16 03/30/17 Yes Sedalia Muta, FNP  potassium chloride 20 MEQ TBCR Take 40 mEq by mouth 2 (two) times daily. 05/14/16 05/17/16 Yes Sedalia Muta, FNP  vitamin B-12 (CYANOCOBALAMIN) 1000 MCG tablet Take 1 tablet (1,000 mcg total) by mouth daily. 09/12/15  Yes Bonnielee Haff, MD  magnesium oxide (MAG-OX) 400 MG tablet Take 1 tablet (400 mg total) by mouth 2 (two) times daily. For 10 days Patient not taking: Reported on 05/17/2016 09/12/15   Bonnielee Haff, MD  potassium chloride (K-DUR) 10 MEQ tablet Take 1 tablet (10 mEq total) by mouth daily. Patient not taking: Reported on 05/17/2016 01/16/16   Jaynee Eagles, PA-C     Allergies  Allergen Reactions  . Ace Inhibitors Other (See Comments)    Acute renal failure  . Angiotensin Receptor Blockers Other (See Comments)    Acute Renal Failure       Objective:  Physical Exam  Constitutional: She is oriented to person, place, and time. She appears well-developed and well-nourished.  HENT:  Head: Normocephalic and atraumatic.  Cardiovascular: Normal rate, regular rhythm, normal heart sounds and intact distal pulses.   Pulmonary/Chest: Effort normal and breath sounds normal.  Musculoskeletal: Normal range of motion.  Neurological: She is alert and oriented to person, place, and time.  Skin: Skin is warm and dry.  Psychiatric: She has a normal mood and affect. Her behavior is normal. Judgment and thought content normal.    Vitals:   05/17/16 0916  BP: 114/70  Pulse: 81  Resp: 18  Temp: 98.1 F (36.7 C)   Assessment & Plan:  1. Dysuria - POCT urinalysis dipstick - POCT Microscopic Urinalysis ([facility])  2. Hypokalemia - COMPLETE METABOLIC PANEL WITH GFR - CBC with Differential/Platelet  3. Acute renal failure, unspecified acute renal failure type ([place]) - COMPLETE  METABOLIC PANEL WITH GFR - CBC with Differential/Platelet   Will follow-up with your lab results.  Carroll Sage. Kenton Kingfisher, MSN, FNP-C [facility]

## 2016-05-17 NOTE — Telephone Encounter (Signed)
Post ED Visit - Positive Culture Follow-up  Culture report reviewed by antimicrobial stewardship pharmacist:  []  Elenor Quinones, Pharm.D. []  Heide Guile, Pharm.D., BCPS []  Parks Neptune, Pharm.D. []  Alycia Rossetti, Pharm.D., BCPS []  [place], Pharm.D., BCPS, AAHIVP []  Legrand Como, Pharm.D., BCPS, AAHIVP []  Milus Glazier, Pharm.D. []  Rob Evette Doffing, Pharm.D. Deirdre Pippins PharmD  Positive urine culture Treated with cephalexin, organism sensitive to the same and no further patient follow-up is required at this time.  Hazle Nordmann 05/17/2016, 11:15 AM

## 2016-05-17 NOTE — Patient Instructions (Signed)
I will contact you regarding you lab results.   IF you received an x-ray today, you will receive an invoice from [facility]. Please contact [facility] at [phone] with questions or concerns regarding your invoice.   IF you received labwork today, you will receive an invoice from [employer]. Please contact [facility] at [phone] with questions or concerns regarding your invoice.   Our billing staff will not be able to assist you with questions regarding bills from these companies.  You will be contacted with the lab results as soon as they are available. The fastest way to get your results is to activate your My Chart account. Instructions are located on the last page of this paperwork. If you have not heard from [place] regarding the results in 2 weeks, please contact this office.

## 2016-06-21 ENCOUNTER — Encounter: Payer: Self-pay | Admitting: Family Medicine

## 2016-06-21 ENCOUNTER — Other Ambulatory Visit: Payer: Self-pay | Admitting: Family Medicine

## 2016-06-21 ENCOUNTER — Other Ambulatory Visit: Payer: Self-pay | Admitting: Physician Assistant

## 2016-06-21 ENCOUNTER — Telehealth: Payer: Self-pay | Admitting: Emergency Medicine

## 2016-06-21 MED ORDER — METFORMIN HCL ER 500 MG PO TB24: 500.0000 mg | ORAL_TABLET | Freq: Every day | ORAL | 1 refills | Status: DC

## 2016-06-21 NOTE — Telephone Encounter (Signed)
Last seen for anxiety/dep 9/26, last RF 11/10 #60.

## 2016-06-21 NOTE — Telephone Encounter (Signed)
Reduced patients dose to 0.25 mg as she appears to be taking medication more frequently.

## 2016-06-21 NOTE — Telephone Encounter (Signed)
Pt made aware medication Xanax called in to [facility] pharmacy and ready for pick up.  Pt also requesting medication RF Metformin- Med sent e-scribe

## 2016-06-23 NOTE — Telephone Encounter (Signed)
Called pt to explain reduced dose. Pt stated that she has not been taking them more frequently. She normally takes once a day, very occasionally twice. Reported what happened was that she was trying to get the top off over the sink and quite a few of them spilled down the sink. She has not picked up the Rx for the 0.25 mg yet. Do you want to continue the dose she had been taking?

## 2016-06-23 NOTE — Telephone Encounter (Signed)
Last ov and labs

## 2016-06-23 NOTE — Telephone Encounter (Signed)
Advise her that I would like her to try the lower dose of 0.25 mg twice a day and see if this works. Due to her age , I would like to try wean her down to a lower dose.  If anxiety is worsened at a lower dose, she will need to come in and we can discuss adding a safer, non controlled medication to her Xanax. Thanks

## 2016-06-24 NOTE — Telephone Encounter (Signed)
Spoke to pt who agreed to try the lower dose and return to see [name] if it does not work for her when she needs it.

## 2016-07-12 ENCOUNTER — Telehealth: Payer: Self-pay | Admitting: Oncology

## 2016-07-12 NOTE — Telephone Encounter (Signed)
Patient called to reschedule appointments per inclement weather expected for 1/8. Rescheduled to next available.

## 2016-07-13 ENCOUNTER — Other Ambulatory Visit: Payer: Medicare Other

## 2016-07-13 ENCOUNTER — Ambulatory Visit: Payer: Medicare Other | Admitting: Oncology

## 2016-08-11 ENCOUNTER — Other Ambulatory Visit (HOSPITAL_BASED_OUTPATIENT_CLINIC_OR_DEPARTMENT_OTHER): Payer: Medicare Other

## 2016-08-11 ENCOUNTER — Ambulatory Visit (HOSPITAL_BASED_OUTPATIENT_CLINIC_OR_DEPARTMENT_OTHER): Payer: Medicare Other | Admitting: Oncology

## 2016-08-11 ENCOUNTER — Telehealth: Payer: Self-pay | Admitting: Oncology

## 2016-08-11 VITALS — BP 147/74 | HR 67 | Temp 98.4°F | Resp 18 | Ht 63.0 in | Wt 171.4 lb

## 2016-08-11 DIAGNOSIS — C50912 Malignant neoplasm of unspecified site of left female breast: Secondary | ICD-10-CM

## 2016-08-11 DIAGNOSIS — M25569 Pain in unspecified knee: Secondary | ICD-10-CM

## 2016-08-11 DIAGNOSIS — C50212 Malignant neoplasm of upper-inner quadrant of left female breast: Secondary | ICD-10-CM

## 2016-08-11 LAB — COMPREHENSIVE METABOLIC PANEL
ALBUMIN: 3.5 g/dL (ref 3.5–5.0)
ALK PHOS: 76 U/L (ref 40–150)
ALT: 17 U/L (ref 0–55)
AST: 30 U/L (ref 5–34)
Anion Gap: 11 mEq/L (ref 3–11)
BILIRUBIN TOTAL: 0.65 mg/dL (ref 0.20–1.20)
BUN: 9.8 mg/dL (ref 7.0–26.0)
CALCIUM: 9.4 mg/dL (ref 8.4–10.4)
CO2: 31 mEq/L — ABNORMAL HIGH (ref 22–29)
CREATININE: 0.8 mg/dL (ref 0.6–1.1)
Chloride: 100 mEq/L (ref 98–109)
EGFR: 88 mL/min/{1.73_m2} — ABNORMAL LOW (ref >90)
Glucose: 167 mg/dl — ABNORMAL HIGH (ref 70–140)
Potassium: 3 mEq/L — CL (ref 3.5–5.1)
Sodium: 141 mEq/L (ref 136–145)
Total Protein: 6.5 g/dL (ref 6.4–8.3)

## 2016-08-11 MED ORDER — ALPRAZOLAM 0.25 MG PO TABS: 0.2500 mg | ORAL_TABLET | Freq: Two times a day (BID) | ORAL | 0 refills | Status: DC | PRN

## 2016-08-11 NOTE — Telephone Encounter (Signed)
Appointments scheduled per 08/11/16 los. Patient was given a copy of the AVS report and appoibtment schedule per 08/11/16 los. °

## 2016-08-11 NOTE — Progress Notes (Signed)
Hematology and Oncology Follow Up Visit  [name] [id number] [birth year] 71 y.o. 08/11/2016 8:42 AM [name], FNPDoolittle, Karen Ham, MD   Principle Diagnosis: 71 year old woman with multifocal left sided breast cancer diagnosed in June of 2015. 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 Ki- 67 is 9% with a recurrence score of 6 on Oncotype DX.   Prior Therapy: She is status post left mastectomy with sentinel lymph node biopsy on 12/20/2013. The final pathology showed invasive ductal carcinoma pathological staging T1cN0. Complicated with chest wall healing and flap necrosis.  Current therapy: Arimidex 1 mg daily started in August of 2015.  Interim History:  [name] presents today for a followup visit. Since her last visit, she reports no major changes in her health. She is currently dealing with severe illness with her brother who is hospitalized in intensive care unit and in the process of having care withdrawn. She is very emotional and sad with the impending loss of her family member.   She is currently on Arimidex without any new side effects. She did not report any arthralgias, myalgias or any GI toxicity. She did not report any pain on her chest wall. She did not report any chest wall masses or axillary lymphadenopathy. She is up-to-date on her mammography without any delay.  She has not had any headaches or blurry vision. Does not report any syncope or seizures. She has not had any fevers or chills or change in her activity level. He does not report any palpitation or lymphedema. She reports now any shortness of breath or cough. She is not report report any nausea or vomiting. She reports no abdominal pain and distention. She reports no constipation or diarrhea. She does not report any petechiae or lymphadenopathy. The rest of her review of systems is unremarkable.  Medications: I have reviewed the patient's current  medications.  Current Outpatient Prescriptions  Medication Sig Dispense Refill  . albuterol (PROVENTIL HFA;VENTOLIN HFA) 108 (90 BASE) MCG/ACT inhaler Inhale 1-2 puffs into the lungs every 4 (four) hours as needed for wheezing or shortness of breath.    . ALPRAZolam (XANAX) 0.25 MG tablet Take 1 tablet (0.25 mg total) by mouth 2 (two) times daily as needed for anxiety. 20 tablet 0  . anastrozole (ARIMIDEX) 1 MG tablet TAKE 1 TABLET (1 MG TOTAL) BY MOUTH DAILY. 90 tablet 2  . atenolol (TENORMIN) 50 MG tablet Take 2 tablets (100 mg total) by mouth daily. Decease 50 x 3 days and then discontinue 180 tablet 3  . Cholecalciferol (VITAMIN D PO) Take 1 capsule by mouth daily.    . indomethacin (INDOCIN) 50 MG capsule Take 1 capsule (50 mg total) by mouth 3 (three) times daily as needed for moderate pain. 90 capsule 0  . magnesium oxide (MAG-OX) 400 MG tablet Take 1 tablet (400 mg total) by mouth 2 (two) times daily. For 10 days 20 tablet 0  . metFORMIN (GLUCOPHAGE XR) 500 MG 24 hr tablet Take 1 tablet (500 mg total) by mouth daily. 90 tablet 1  . vitamin B-12 (CYANOCOBALAMIN) 1000 MCG tablet Take 1 tablet (1,000 mcg total) by mouth daily. 30 tablet 3  . potassium chloride 20 MEQ TBCR Take 40 mEq by mouth 2 (two) times daily. 6 tablet 0   No current facility-administered medications for this visit.      Allergies:  Allergies  Allergen Reactions  . Ace Inhibitors Other (See Comments)  Acute renal failure  . Angiotensin Receptor Blockers Other (See Comments)    Acute Renal Failure    Past Medical History, Surgical history, Social history, and Family History were reviewed and updated.   Physical Exam: Blood pressure (!) 147/74, pulse 67, temperature 98.4 F (36.9 C), temperature source Oral, resp. rate 18, height 5' 3"  (1.6 m), weight 171 lb 6.4 oz (77.7 kg), SpO2 97 %. ECOG: 0 General appearance: Well appearing woman appeared without distress. Slightly anxious and depressed. Head:  Normocephalic, without obvious abnormality  no oral ulcers or lesions. Neck: no adenopathy Lymph nodes: Cervical, supraclavicular, and axillary nodes normal. Heart:regular rate and rhythm, S1, S2 normal, no murmur, click, rub or gallop Lung:chest clear, no wheezing, rales, normal symmetric air entry Chest wall examination revealed well-healed. Left mastectomy site without nodularity. Abdomin: soft, non-tender, without masses or organomegaly no rebound or guarding. EXT:no erythema, induration, or nodules   Lab Results: Lab Results  Component Value Date   WBC 6.2 05/17/2016   HGB 11.1 (L) 05/17/2016   HCT 35.0 05/17/2016   MCV 107.4 (H) 05/17/2016   PLT 307 05/17/2016     Chemistry      Component Value Date/Time   NA 138 05/17/2016 0950   NA 135 (L) 03/11/2015 0828   K 4.8 05/17/2016 0950   K 3.6 03/11/2015 0828   CL 102 05/17/2016 0950   CO2 32 (H) 05/17/2016 0950   CO2 24 03/11/2015 0828   BUN 11 05/17/2016 0950   BUN 17.0 03/11/2015 0828   CREATININE 0.78 05/17/2016 0950   CREATININE 1.5 (H) 03/11/2015 0828      Component Value Date/Time   CALCIUM 9.6 05/17/2016 0950   CALCIUM 8.2 (L) 03/11/2015 0828   ALKPHOS 136 (H) 05/17/2016 0950   ALKPHOS 115 03/11/2015 0828   AST 68 (H) 05/17/2016 0950   AST 128 (H) 03/11/2015 0828   ALT 46 (H) 05/17/2016 0950   ALT 67 (H) 03/11/2015 0828   BILITOT 1.9 (H) 05/17/2016 0950   BILITOT 2.22 (H) 03/11/2015 6659        Impression and Plan:  71 year old woman with the following issues:  1. 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 Ki-67 of 9%. She has a low recurrence score of 6 on her Oncotype DX. 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 She is status post left mastectomy in June of 2015 followed by debridements in July of 2015.   She is currently on Arimidex since August 2015 and tolerated it well.  Risks and benefits of continuing this  medication were reviewed and she is agreeable to continue. Discontinuation around year 2020 is anticipated.  2. Knee pain: She is seen orthopedic specialist regarding this issue. Unrelated to her aromatase inhibitor.  3. Mammography surveillance: She is up-to-date at this time.  4. Osteoporosis prophylaxis: She continues to be on calcium and vitamin D. Repeat DEXA scan will be needed in the future.  5. Followup: Will be in 6 months.     [facility], MD 2/7/20188:42 AM

## 2016-10-18 ENCOUNTER — Other Ambulatory Visit: Payer: Self-pay | Admitting: Family Medicine

## 2016-10-18 DIAGNOSIS — Z8739 Personal history of other diseases of the musculoskeletal system and connective tissue: Secondary | ICD-10-CM

## 2016-10-18 NOTE — Telephone Encounter (Signed)
I approved refill of Indomethacin for 45 tablets only because patient is overdue for six month follow-up.  Molli Barrows no longer works at [employer] at [employer].  Please offer her an appointment this month with one of the new providers at 104 for follow-up of diabetes, high blood pressure, and gout.

## 2016-11-03 ENCOUNTER — Ambulatory Visit (INDEPENDENT_AMBULATORY_CARE_PROVIDER_SITE_OTHER): Payer: Medicare Other | Admitting: Physician Assistant

## 2016-11-03 ENCOUNTER — Encounter: Payer: Self-pay | Admitting: Physician Assistant

## 2016-11-03 VITALS — BP 142/80 | HR 67 | Temp 97.6°F | Resp 18 | Ht 63.03 in | Wt 172.2 lb

## 2016-11-03 DIAGNOSIS — J302 Other seasonal allergic rhinitis: Secondary | ICD-10-CM | POA: Diagnosis not present

## 2016-11-03 DIAGNOSIS — Z8739 Personal history of other diseases of the musculoskeletal system and connective tissue: Secondary | ICD-10-CM

## 2016-11-03 DIAGNOSIS — I10 Essential (primary) hypertension: Secondary | ICD-10-CM

## 2016-11-03 DIAGNOSIS — E119 Type 2 diabetes mellitus without complications: Secondary | ICD-10-CM | POA: Diagnosis not present

## 2016-11-03 LAB — HEMOGLOBIN A1C
ESTIMATED AVERAGE GLUCOSE: 120 mg/dL
Hgb A1c MFr Bld: 5.8 % — ABNORMAL HIGH (ref 4.8–5.6)

## 2016-11-03 LAB — CMP14+EGFR
A/G RATIO: 1.3 (ref 1.2–2.2)
ALBUMIN: 3.6 g/dL (ref 3.5–4.8)
ALT: 8 IU/L (ref 0–32)
AST: 12 IU/L (ref 0–40)
Alkaline Phosphatase: 60 IU/L (ref 39–117)
BUN/Creatinine Ratio: 16 (ref 12–28)
BUN: 10 mg/dL (ref 8–27)
Bilirubin Total: 0.3 mg/dL (ref 0.0–1.2)
CALCIUM: 9.8 mg/dL (ref 8.7–10.3)
CO2: 28 mmol/L (ref 18–29)
CREATININE: 0.63 mg/dL (ref 0.57–1.00)
Chloride: 98 mmol/L (ref 96–106)
GFR, EST AFRICAN AMERICAN: 104 mL/min/{1.73_m2} (ref >59)
GFR, EST NON AFRICAN AMERICAN: 90 mL/min/{1.73_m2} (ref >59)
GLOBULIN, TOTAL: 2.7 g/dL (ref 1.5–4.5)
Glucose: 136 mg/dL — ABNORMAL HIGH (ref 65–99)
POTASSIUM: 4.1 mmol/L (ref 3.5–5.2)
SODIUM: 142 mmol/L (ref 134–144)
Total Protein: 6.3 g/dL (ref 6.0–8.5)

## 2016-11-03 MED ORDER — ATENOLOL 50 MG PO TABS: 100.0000 mg | ORAL_TABLET | Freq: Every day | ORAL | 1 refills | Status: DC

## 2016-11-03 MED ORDER — INDOMETHACIN 50 MG PO CAPS: 50.0000 mg | ORAL_CAPSULE | Freq: Three times a day (TID) | ORAL | 0 refills | Status: DC | PRN

## 2016-11-03 NOTE — Progress Notes (Signed)
PRIMARY CARE AT [facility] [address] [place] [address] [phone]  Date:  11/03/2016   Name:  [name]   DOB:  September 06, 1945   MRN:  [id number]  PCP:  Ivar Drape, PA    History of Present Illness:  [name] is a 71 y.o. female patient who presents to PCP with No chief complaint on file.      Patient Active Problem List   Diagnosis Date Noted  . Weakness 09/10/2015  . Hypokalemia 09/10/2015  . COPD exacerbation ([place]) 09/10/2015  . Hypomagnesemia 09/10/2015  . Gout 08/20/2015  . Skin flap necrosis ([place]) 01/17/2014  . Breast cancer of upper-inner quadrant of left female breast ([place]) 11/16/2013  . Abnormal LFTs--hep c neg 08/24/2013  . HNP (herniated nucleus pulposus), thoracic 07/17/2013  . History of tobacco use 05/24/2013  . Depression with anxiety 05/24/2013  . Vitamin D deficiency 05/24/2013  . Venous insufficiency 05/24/2013  . H/O bilateral hip replacements 01/16/2013  . BMI 36.0-36.9,adult 11/19/2011  . DM (diabetes mellitus) ([place]) 11/19/2011  . HTN (hypertension) 11/19/2011  . Asthma 11/19/2011    Past Medical History:  Diagnosis Date  . Allergy   . Anxiety   . 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 Asthma   . Breast cancer ([place])    S/P left mastectomy 2015  . Depression   . Hypertension   . Migraine 1980's  . Type II diabetes mellitus ([place])     Past Surgical History:  Procedure Laterality Date  . ABDOMINAL HYSTERECTOMY  1981   "fibroids"  . ANKLE FRACTURE SURGERY Left 03/15/2000   with hardware  . APPLICATION OF WOUND VAC  01/17/2014   w/chest wall debridement  . APPLICATION OF WOUND VAC Left 01/17/2014   Procedure: PLACEMENT OF VAC DRESSING;  Surgeon: Merrie Roof, MD;  Location: [facility];  Service: General;  Laterality: Left;  . BREAST SURGERY Left    removed due to BCA  . COLONOSCOPY    . JOINT REPLACEMENT    . MASTECTOMY COMPLETE / SIMPLE W/ SENTINEL NODE BIOPSY Left   . MASTECTOMY W/ SENTINEL NODE BIOPSY Left 12/20/2013   Procedure: MASTECTOMY WITH SENTINEL LYMPH NODE BIOPSY;  Surgeon: Merrie Roof, MD;  Location: [facility];  Service: General;  Laterality: Left;  . ORIF SHOULDER FRACTURE Left 1964  . ROTATOR CUFF REPAIR Right   . TOTAL HIP ARTHROPLASTY Left   . TOTAL HIP ARTHROPLASTY Right   . TUBAL LIGATION  1973  . WOUND DEBRIDEMENT Left 01/17/2014   Procedure: DEBRIDEMENT OF CHEST WALL;  Surgeon: Merrie Roof, MD;  Location: [facility] OR;  Service: General;  Laterality: Left;    Social History  Substance Use Topics  . Smoking status: Former Smoker    Packs/day: 0.50    Years: 40.00    Types: Cigarettes    Quit date: 11/11/2000  . Smokeless tobacco: Never Used  . Alcohol use 0.6 - 1.2 oz/week    1 - 2 Shots of liquor per week     Comment: weekly     Family History  Problem Relation Age of Onset  . Asthma Mother   . Stroke Mother   . Asthma Father   . Alcohol abuse Father   . Colon cancer Neg Hx     Allergies  Allergen Reactions  . Ace Inhibitors Other (See Comments)    Acute renal failure  . Angiotensin Receptor Blockers Other (See Comments)    Acute  Renal Failure    Medication list has been reviewed and updated.  Current Outpatient Prescriptions on File Prior to Visit  Medication Sig Dispense Refill  . albuterol (PROVENTIL HFA;VENTOLIN HFA) 108 (90 BASE) MCG/ACT inhaler Inhale 1-2 puffs into the lungs every 4 (four) hours as needed for wheezing or shortness of breath.    . ALPRAZolam (XANAX) 0.25 MG tablet Take 1 tablet (0.25 mg total) by mouth 2 (two) times daily as needed for anxiety. 20 tablet 0  . anastrozole (ARIMIDEX) 1 MG tablet TAKE 1 TABLET (1 MG TOTAL) BY MOUTH DAILY. 90 tablet 2  . atenolol (TENORMIN) 50 MG tablet Take 2 tablets (100 mg total) by mouth daily. Decease 50 x 3 days and then discontinue 180 tablet 3  . Cholecalciferol (VITAMIN D PO) Take 1 capsule by mouth daily.    . indomethacin (INDOCIN) 50 MG capsule TAKE 1 CAPSULE (50 MG TOTAL) BY MOUTH 3 (THREE) TIMES DAILY AS  NEEDED FOR MODERATE PAIN. 45 capsule 0  . magnesium oxide (MAG-OX) 400 MG tablet Take 1 tablet (400 mg total) by mouth 2 (two) times daily. For 10 days 20 tablet 0  . metFORMIN (GLUCOPHAGE XR) 500 MG 24 hr tablet Take 1 tablet (500 mg total) by mouth daily. 90 tablet 1  . potassium chloride 20 MEQ TBCR Take 40 mEq by mouth 2 (two) times daily. 6 tablet 0  . vitamin B-12 (CYANOCOBALAMIN) 1000 MCG tablet Take 1 tablet (1,000 mcg total) by mouth daily. 30 tablet 3   No current facility-administered medications on file prior to visit.     ROS ROS otherwise unremarkable unless listed above.  Physical Examination: There were no vitals taken for this visit. Ideal Body Weight:    Physical Exam   Assessment and Plan: [name] is a 71 y.o. female who is here today  There are no diagnoses linked to this encounter.  Ivar Drape, PA-C [facility] 11/03/2016 8:15 AM

## 2016-11-03 NOTE — Progress Notes (Signed)
PRIMARY CARE AT [facility] [address], [place] [address] [phone]  Date:  11/03/2016   Name:  [name]   DOB:  26-Jan-1946   MRN:  [id number]  PCP:  Ivar Drape, PA    History of Present Illness:  [name] is a 71 y.o. female patient who presents to PCP with  Chief Complaint  Patient presents with  . Follow-up    HTN     She is here for follow up of htn and diabetes, and a complaint of uncontrolled runny nose, and itchy eyes.  Runny nose, coughing, and itching eyes for 3-4 weeks.  No fever, productive cough, or fever.  She has a hx of pollen allergies.  She is taking nothing for her symptoms. Blood sugar is occasionally checked during the week with blood sugar around 150 and rarely 200s.  Continues to have weekend etOH of "a lot" during social events.  She does not drink alone.  No vision changes, polydipsia, polyuria, nausea, or diarrhea.  She plans to schedule for an eye exam.  Patient also would like refill of her htn med.  She has not been without blood pressure medicine of atenolol.  No cp, palpitations, sob, or leg swellings.  No dizziness.  She continues to take indomethacin as needed.  She is not avoiding shellfish or etOH.  Last bout was more than 1 month ago.  Generally occurs in her ankle, either She is no longer taking any potassium pills    Patient Active Problem List   Diagnosis Date Noted  . Weakness 09/10/2015  . Hypokalemia 09/10/2015  . COPD exacerbation ([place]) 09/10/2015  . Hypomagnesemia 09/10/2015  . Gout 08/20/2015  . Skin flap necrosis ([place]) 01/17/2014  . Breast cancer of upper-inner quadrant of left female breast ([place]) 11/16/2013  . Abnormal LFTs--hep c neg 08/24/2013  . HNP (herniated nucleus pulposus), thoracic 07/17/2013  . History of tobacco use 05/24/2013  . Depression with anxiety 05/24/2013  . Vitamin D deficiency 05/24/2013  . Venous insufficiency 05/24/2013  . H/O bilateral hip replacements 01/16/2013  . BMI  36.0-36.9,adult 11/19/2011  . DM (diabetes mellitus) ([place]) 11/19/2011  . HTN (hypertension) 11/19/2011  . Asthma 11/19/2011    Past Medical History:  Diagnosis Date  . Allergy   . Anxiety   . 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 Asthma   . Breast cancer ([place])    S/P left mastectomy 2015  . Depression   . Hypertension   . Migraine 1980's  . Type II diabetes mellitus ([place])     Past Surgical History:  Procedure Laterality Date  . ABDOMINAL HYSTERECTOMY  1981   "fibroids"  . ANKLE FRACTURE SURGERY Left 03/15/2000   with hardware  . APPLICATION OF WOUND VAC  01/17/2014   w/chest wall debridement  . APPLICATION OF WOUND VAC Left 01/17/2014   Procedure: PLACEMENT OF VAC DRESSING;  Surgeon: Merrie Roof, MD;  Location: [facility];  Service: General;  Laterality: Left;  . BREAST SURGERY Left    removed due to BCA  . COLONOSCOPY    . JOINT REPLACEMENT    . MASTECTOMY COMPLETE / SIMPLE W/ SENTINEL NODE BIOPSY Left   . MASTECTOMY W/ SENTINEL NODE BIOPSY Left 12/20/2013   Procedure: MASTECTOMY WITH SENTINEL LYMPH NODE BIOPSY;  Surgeon: Merrie Roof, MD;  Location: [facility];  Service: General;  Laterality: Left;  . ORIF SHOULDER FRACTURE Left 1964  . ROTATOR CUFF REPAIR Right   .  TOTAL HIP ARTHROPLASTY Left   . TOTAL HIP ARTHROPLASTY Right   . TUBAL LIGATION  1973  . WOUND DEBRIDEMENT Left 01/17/2014   Procedure: DEBRIDEMENT OF CHEST WALL;  Surgeon: Merrie Roof, MD;  [facility];  Service: General;  Laterality: Left;    Social History  Substance Use Topics  . Smoking status: Former Smoker    Packs/day: 0.50    Years: 40.00    Types: Cigarettes    Quit date: 11/11/2000  . Smokeless tobacco: Never Used  . Alcohol use 0.6 - 1.2 oz/week    1 - 2 Shots of liquor per week     Comment: weekly     Family History  Problem Relation Age of Onset  . Asthma Mother   . Stroke Mother   . Asthma Father   . Alcohol abuse Father   . Colon cancer Neg Hx     Allergies  Allergen  Reactions  . Ace Inhibitors Other (See Comments)    Acute renal failure  . Angiotensin Receptor Blockers Other (See Comments)    Acute Renal Failure    Medication list has been reviewed and updated.  Current Outpatient Prescriptions on File Prior to Visit  Medication Sig Dispense Refill  . albuterol (PROVENTIL HFA;VENTOLIN HFA) 108 (90 BASE) MCG/ACT inhaler Inhale 1-2 puffs into the lungs every 4 (four) hours as needed for wheezing or shortness of breath.    . ALPRAZolam (XANAX) 0.25 MG tablet Take 1 tablet (0.25 mg total) by mouth 2 (two) times daily as needed for anxiety. 20 tablet 0  . anastrozole (ARIMIDEX) 1 MG tablet TAKE 1 TABLET (1 MG TOTAL) BY MOUTH DAILY. 90 tablet 2  . atenolol (TENORMIN) 50 MG tablet Take 2 tablets (100 mg total) by mouth daily. Decease 50 x 3 days and then discontinue 180 tablet 3  . Cholecalciferol (VITAMIN D PO) Take 1 capsule by mouth daily.    . indomethacin (INDOCIN) 50 MG capsule TAKE 1 CAPSULE (50 MG TOTAL) BY MOUTH 3 (THREE) TIMES DAILY AS NEEDED FOR MODERATE PAIN. 45 capsule 0  . magnesium oxide (MAG-OX) 400 MG tablet Take 1 tablet (400 mg total) by mouth 2 (two) times daily. For 10 days 20 tablet 0  . metFORMIN (GLUCOPHAGE XR) 500 MG 24 hr tablet Take 1 tablet (500 mg total) by mouth daily. 90 tablet 1  . vitamin B-12 (CYANOCOBALAMIN) 1000 MCG tablet Take 1 tablet (1,000 mcg total) by mouth daily. 30 tablet 3  . potassium chloride 20 MEQ TBCR Take 40 mEq by mouth 2 (two) times daily. 6 tablet 0   No current facility-administered medications on file prior to visit.     ROS ROS otherwise unremarkable unless listed above.  Physical Examination: BP (!) 142/80   Pulse 67   Temp 97.6 F (36.4 C) (Oral)   Resp 18   Ht 5' 3.03" (1.601 m)   Wt 172 lb 3.2 oz (78.1 kg)   SpO2 94%   BMI 30.47 kg/m  Ideal Body Weight: Weight in (lb) to have BMI = 25: 141  Physical Exam  Constitutional: She is oriented to person, place, and time. She appears  well-developed and well-nourished. No distress.  HENT:  Head: Normocephalic and atraumatic.  Right Ear: Tympanic membrane, external ear and ear canal normal.  Left Ear: Tympanic membrane, external ear and ear canal normal.  Nose: Mucosal edema and rhinorrhea present. Right sinus exhibits no maxillary sinus tenderness and no frontal sinus tenderness. Left sinus exhibits no  maxillary sinus tenderness and no frontal sinus tenderness.  Mouth/Throat: No uvula swelling. No oropharyngeal exudate, posterior oropharyngeal edema or posterior oropharyngeal erythema.  Eyes: Conjunctivae and EOM are normal. Pupils are equal, round, and reactive to light.  Cardiovascular: Normal rate and regular rhythm.  Exam reveals no gallop, no distant heart sounds and no friction rub.   No murmur heard. Pulses:      Radial pulses are 2+ on the right side, and 2+ on the left side.       Dorsalis pedis pulses are 2+ on the right side, and 2+ on the left side.  Pulmonary/Chest: Effort normal. No respiratory distress. She has no decreased breath sounds. She has no wheezes. She has no rhonchi.  Feet:  Right Foot:  Protective Sensation: 6 sites tested. 6 sites sensed.  Skin Integrity: Positive for dry skin. Negative for ulcer or blister.  Left Foot:  Protective Sensation: 6 sites tested. 6 sites sensed.  Skin Integrity: Positive for dry skin. Negative for ulcer or blister.  Lymphadenopathy:       Head (right side): No submandibular, no tonsillar, no preauricular and no posterior auricular adenopathy present.       Head (left side): No submandibular, no tonsillar, no preauricular and no posterior auricular adenopathy present.  Neurological: She is alert and oriented to person, place, and time.  Skin: She is not diaphoretic.  Psychiatric: She has a normal mood and affect. Her behavior is normal.     Assessment and Plan: [name] is a 71 y.o. female who is here today for cc of htn, dm2, and allergies. Type 2  diabetes mellitus without complication, without long-term current use of insulin (HCC) - Plan: CMP14+EGFR, Hemoglobin A1c  Essential hypertension - Plan: CMP14+EGFR  History of gout - Plan: indomethacin (INDOCIN) 50 MG capsule  Ivar Drape, PA-C [facility] 5/3/20188:21 AM

## 2016-11-03 NOTE — Patient Instructions (Signed)
I will contact you with your lab results.

## 2016-11-04 MED ORDER — CETIRIZINE HCL 10 MG PO TABS: 10.0000 mg | ORAL_TABLET | Freq: Every day | ORAL | 11 refills | Status: DC

## 2016-11-04 MED ORDER — OLOPATADINE HCL 0.1 % OP SOLN: 1.0000 [drp] | Freq: Two times a day (BID) | OPHTHALMIC | 12 refills | Status: DC

## 2016-11-05 ENCOUNTER — Other Ambulatory Visit: Payer: Self-pay | Admitting: Physician Assistant

## 2016-11-05 MED ORDER — METFORMIN HCL ER 500 MG PO TB24: 500.0000 mg | ORAL_TABLET | Freq: Every day | ORAL | 1 refills | Status: DC

## 2016-11-25 ENCOUNTER — Telehealth: Payer: Self-pay

## 2016-11-25 NOTE — Telephone Encounter (Signed)
Left message for patient to schedule Medicare Annual Wellness Visit -nr

## 2016-11-26 ENCOUNTER — Other Ambulatory Visit: Payer: Self-pay | Admitting: Physician Assistant

## 2016-12-04 ENCOUNTER — Other Ambulatory Visit: Payer: Self-pay | Admitting: Physician Assistant

## 2016-12-04 DIAGNOSIS — Z8739 Personal history of other diseases of the musculoskeletal system and connective tissue: Secondary | ICD-10-CM

## 2016-12-04 NOTE — Telephone Encounter (Signed)
11/03/16 last refill

## 2016-12-07 NOTE — Telephone Encounter (Signed)
Please advise indomethacin was prescribed for gout flares.  Not a daily medication.

## 2016-12-08 ENCOUNTER — Other Ambulatory Visit: Payer: Self-pay | Admitting: Physician Assistant

## 2016-12-08 DIAGNOSIS — Z8739 Personal history of other diseases of the musculoskeletal system and connective tissue: Secondary | ICD-10-CM

## 2016-12-19 ENCOUNTER — Other Ambulatory Visit: Payer: Self-pay | Admitting: Physician Assistant

## 2016-12-19 DIAGNOSIS — Z8739 Personal history of other diseases of the musculoskeletal system and connective tissue: Secondary | ICD-10-CM

## 2016-12-21 ENCOUNTER — Encounter: Payer: Self-pay | Admitting: Physician Assistant

## 2016-12-21 ENCOUNTER — Ambulatory Visit (INDEPENDENT_AMBULATORY_CARE_PROVIDER_SITE_OTHER): Payer: Medicare Other

## 2016-12-21 ENCOUNTER — Ambulatory Visit (INDEPENDENT_AMBULATORY_CARE_PROVIDER_SITE_OTHER): Payer: Medicare Other | Admitting: Physician Assistant

## 2016-12-21 VITALS — BP 122/79 | HR 70 | Temp 98.4°F | Resp 16 | Ht 63.0 in | Wt 170.0 lb

## 2016-12-21 DIAGNOSIS — M79671 Pain in right foot: Secondary | ICD-10-CM

## 2016-12-21 DIAGNOSIS — S93401A Sprain of unspecified ligament of right ankle, initial encounter: Secondary | ICD-10-CM | POA: Diagnosis not present

## 2016-12-21 NOTE — Progress Notes (Signed)
PRIMARY CARE AT [place], [place] [address] [phone]  Date:  12/21/2016   Name:  [name]   DOB:  03-13-1946   MRN:  [id number]  PCP:  Joretta Bachelor, PA    History of Present Illness:  [name] is a 71 y.o. female patient who presents to PCP with  Chief Complaint  Patient presents with  . Ankle Injury    Right ankle swelling, pt fell last tuesday when getting out of the bed      Right ankle swelling 7 days ago.  She getting up from her bed, when she placed the weight on her right foot.  There was extreme pain that sent her to the floor.  She injured her lip with the fall.  She noticed no swelling at the time.  And prior to the fall, there was no trauma.  She states that she was able to take indomethacin, and the pain improved within 3 days.  She then noticed swelling on day 4 and an increased pain with ambulation.  There is no numbness or tingling.  She has tried to elevate and compress the front of the ankle and back.  She notes that she has consumed etOH.  She is not sure when she fell, that she may have twisted her ankle.  No thermotherapy at this time.            Patient Active Problem List   Diagnosis Date Noted  . Weakness 09/10/2015  . Hypokalemia 09/10/2015  . COPD exacerbation ([place]) 09/10/2015  . Hypomagnesemia 09/10/2015  . Gout 08/20/2015  . Skin flap necrosis ([place]) 01/17/2014  . Breast cancer of upper-inner quadrant of left female breast ([place]) 11/16/2013  . Abnormal LFTs--hep c neg 08/24/2013  . HNP (herniated nucleus pulposus), thoracic 07/17/2013  . History of tobacco use 05/24/2013  . Depression with anxiety 05/24/2013  . Vitamin D deficiency 05/24/2013  . Venous insufficiency 05/24/2013  . H/O bilateral hip replacements 01/16/2013  . BMI 36.0-36.9,adult 11/19/2011  . DM (diabetes mellitus) ([place]) 11/19/2011  . HTN (hypertension) 11/19/2011  . Asthma 11/19/2011    Past Medical History:  Diagnosis Date  . Allergy   .  Anxiety   . 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 Asthma   . Breast cancer ([place])    S/P left mastectomy 2015  . Depression   . Hypertension   . Migraine 1980's  . Type II diabetes mellitus ([place])     Past Surgical History:  Procedure Laterality Date  . ABDOMINAL HYSTERECTOMY  1981   "fibroids"  . ANKLE FRACTURE SURGERY Left 03/15/2000   with hardware  . APPLICATION OF WOUND VAC  01/17/2014   w/chest wall debridement  . APPLICATION OF WOUND VAC Left 01/17/2014   Procedure: PLACEMENT OF VAC DRESSING;  Surgeon: Merrie Roof, MD;  Location: [facility];  Service: General;  Laterality: Left;  . BREAST SURGERY Left    removed due to BCA  . COLONOSCOPY    . JOINT REPLACEMENT    . MASTECTOMY COMPLETE / SIMPLE W/ SENTINEL NODE BIOPSY Left   . MASTECTOMY W/ SENTINEL NODE BIOPSY Left 12/20/2013   Procedure: MASTECTOMY WITH SENTINEL LYMPH NODE BIOPSY;  Surgeon: Merrie Roof, MD;  Location: [facility];  Service: General;  Laterality: Left;  . ORIF SHOULDER FRACTURE Left 1964  . ROTATOR CUFF REPAIR Right   . TOTAL HIP ARTHROPLASTY Left   . TOTAL HIP ARTHROPLASTY Right   .  TUBAL LIGATION  1973  . WOUND DEBRIDEMENT Left 01/17/2014   Procedure: DEBRIDEMENT OF CHEST WALL;  Surgeon: Merrie Roof, MD;  Location: [facility];  Service: General;  Laterality: Left;    Social History  Substance Use Topics  . Smoking status: Former Smoker    Packs/day: 0.50    Years: 40.00    Types: Cigarettes    Quit date: 11/11/2000  . Smokeless tobacco: Never Used  . Alcohol use 0.6 - 1.2 oz/week    1 - 2 Shots of liquor per week     Comment: weekly     Family History  Problem Relation Age of Onset  . Asthma Mother   . Stroke Mother   . Asthma Father   . Alcohol abuse Father   . Colon cancer Neg Hx     Allergies  Allergen Reactions  . Ace Inhibitors Other (See Comments)    Acute renal failure  . Angiotensin Receptor Blockers Other (See Comments)    Acute Renal Failure    Medication list has been reviewed  and updated.  Current Outpatient Prescriptions on File Prior to Visit  Medication Sig Dispense Refill  . albuterol (PROVENTIL HFA;VENTOLIN HFA) 108 (90 BASE) MCG/ACT inhaler Inhale 1-2 puffs into the lungs every 4 (four) hours as needed for wheezing or shortness of breath.    . ALPRAZolam (XANAX) 0.25 MG tablet Take 1 tablet (0.25 mg total) by mouth 2 (two) times daily as needed for anxiety. 20 tablet 0  . anastrozole (ARIMIDEX) 1 MG tablet TAKE 1 TABLET (1 MG TOTAL) BY MOUTH DAILY. 90 tablet 2  . atenolol (TENORMIN) 50 MG tablet Take 2 tablets (100 mg total) by mouth daily. 180 tablet 1  . cetirizine (ZYRTEC) 10 MG tablet Take 1 tablet (10 mg total) by mouth daily. 30 tablet 11  . Cholecalciferol (VITAMIN D PO) Take 1 capsule by mouth daily.    . indomethacin (INDOCIN) 50 MG capsule TAKE 1 CAPSULE (50 MG TOTAL) BY MOUTH 3 (THREE) TIMES DAILY AS NEEDED FOR MODERATE PAIN. 60 capsule 0  . magnesium oxide (MAG-OX) 400 MG tablet Take 1 tablet (400 mg total) by mouth 2 (two) times daily. For 10 days 20 tablet 0  . metFORMIN (GLUCOPHAGE XR) 500 MG 24 hr tablet Take 1 tablet (500 mg total) by mouth daily. 90 tablet 1  . olopatadine (PATANOL) 0.1 % ophthalmic solution Place 1 drop into both eyes 2 (two) times daily. 5 mL 12  . ONE TOUCH ULTRA TEST test strip TEST BLOOD SUGAR ONCE DAILY. DX: E11.9 100 each 3  . vitamin B-12 (CYANOCOBALAMIN) 1000 MCG tablet Take 1 tablet (1,000 mcg total) by mouth daily. 30 tablet 3  . potassium chloride 20 MEQ TBCR Take 40 mEq by mouth 2 (two) times daily. 6 tablet 0   No current facility-administered medications on file prior to visit.     ROS ROS otherwise unremarkable unless listed above.  Physical Examination: BP 122/79   Pulse 70   Temp 98.4 F (36.9 C) (Oral)   Resp 16   Ht 5\' 3"  (1.6 m)   Wt 170 lb (77.1 kg)   SpO2 95%   BMI 30.11 kg/m  Ideal Body Weight: Weight in (lb) to have BMI = 25: 140.8  Physical Exam  Constitutional: She is oriented to  person, place, and time. She appears well-developed and well-nourished. No distress.  HENT:  Head: Normocephalic and atraumatic.  Right Ear: External ear normal.  Left Ear: External ear normal.  Eyes: Conjunctivae and EOM are normal. Pupils are equal, round, and reactive to light.  Cardiovascular: Normal rate and regular rhythm.  Exam reveals no friction rub.   No murmur heard. Pulmonary/Chest: Effort normal. No respiratory distress.  Musculoskeletal:  Substantial swelling at the right lateral malleolus.  Bandage lines seen and where this cuts off, there is swelling of the distal metatarsal area.  appropriate  Neurological: She is alert and oriented to person, place, and time.  Skin: Skin is warm and dry. She is not diaphoretic.  Psychiatric: She has a normal mood and affect. Her behavior is normal.     Assessment and Plan: Francy Mcilvaine Straus is a 71 y.o. female who is here today for cc of right foot pain. Appears likely sprain following gout flare.   Advised to stray away from triggers as etOH. RICE.  Rtc in 2 weeks if symptoms do not improve.  Sprain of right ankle, unspecified ligament, initial encounter  Right foot pain - Plan: DG Foot Complete Right, DG Ankle Complete Right  Ivar Drape, PA-C [facility] 6/19/20181:39 PM

## 2016-12-21 NOTE — Patient Instructions (Addendum)
     IF you received an x-ray today, you will receive an invoice from [facility]. Please contact [facility] at [phone] with questions or concerns regarding your invoice.   IF you received labwork today, you will receive an invoice from [facility]. Please contact [facility] at [phone] with questions or concerns regarding your invoice.   Our billing staff will not be able to assist you with questions regarding bills from these companies.  You will be contacted with the lab results as soon as they are available. The fastest way to get your results is to activate your My Chart account. Instructions are located on the last page of this paperwork. If you have not heard from [place] regarding the results in 2 weeks, please contact this office.    I would like you to use ice 3 times per day for 15 minutes. I would like you to wear the compression, but take off at nighttime.   If no improvement within 1 week, please return.   Likely bad bone contusion and ankle sprain with initial gout.   RICE for Routine Care of Injuries Many injuries can be cared for using rest, ice, compression, and elevation (RICE therapy). Using RICE therapy can help to lessen pain and swelling. It can help your body to heal. Rest Reduce your normal activities and avoid using the injured part of your body. You can go back to your normal activities when you feel okay and your doctor says it is okay. Ice Do not put ice on your bare skin.  Put ice in a plastic bag.  Place a towel between your skin and the bag.  Leave the ice on for 20 minutes, 2-3 times a day.  Do this for as long as told by your doctor. Compression Compression means putting pressure on the injured area. This can be done with an elastic bandage. If an elastic bandage has been applied:  Remove and reapply the bandage every 3-4 hours or as told by your doctor.  Make sure the bandage is not wrapped too tight. Wrap the bandage more  loosely if part of your body beyond the bandage is blue, swollen, cold, painful, or loses feeling (numb).  See your doctor if the bandage seems to make your problems worse.  Elevation Elevation means keeping the injured area raised. Raise the injured area above your heart or the center of your chest if you can. When should I get help? You should get help if:  You keep having pain and swelling.  Your symptoms get worse.  Get help right away if: You should get help right away if:  You have sudden bad pain at or below the area of your injury.  You have redness or more swelling around your injury.  You have tingling or numbness at or below the injury that does not go away when you take off the bandage.  This information is not intended to replace advice given to you by your health care provider. Make sure you discuss any questions you have with your health care provider. Document Released: 12/08/2007 Document Revised: 05/18/2016 Document Reviewed: 05/29/2014 Elsevier Interactive Patient Education  2017 [employer].

## 2016-12-23 ENCOUNTER — Telehealth: Payer: Self-pay

## 2016-12-23 NOTE — Telephone Encounter (Signed)
IC  LMOVM for pt to only be taking Indocin for gout flare ups... Not an every day pain med.   Med for gout only.  Refilled 6/5.  Refused 6/20 refill request.

## 2017-02-08 ENCOUNTER — Ambulatory Visit: Payer: Medicare Other | Admitting: Oncology

## 2017-02-08 ENCOUNTER — Other Ambulatory Visit: Payer: Medicare Other

## 2017-02-14 ENCOUNTER — Other Ambulatory Visit: Payer: Self-pay | Admitting: Physician Assistant

## 2017-02-17 ENCOUNTER — Telehealth: Payer: Self-pay

## 2017-02-17 DIAGNOSIS — I10 Essential (primary) hypertension: Secondary | ICD-10-CM

## 2017-02-17 MED ORDER — ATENOLOL 50 MG PO TABS: 100.0000 mg | ORAL_TABLET | Freq: Every day | ORAL | 1 refills | Status: DC

## 2017-02-19 ENCOUNTER — Other Ambulatory Visit: Payer: Self-pay | Admitting: Physician Assistant

## 2017-03-27 IMAGING — CT CT HEAD W/O CM
2 series · 16 of 30 positions shown, 20 images · non-contrast
Comparison: None.

CLINICAL DATA: Right-sided leg weakness

EXAM:
CT HEAD WITHOUT CONTRAST
TECHNIQUE: Contiguous axial images were obtained from the base of the skull
through the vertex without intravenous contrast.

[Series 201: head w/o, idose (1) · axial · non-contrast · 0.49mm/px · z∈[+78,+198]mm · 13 of 30 slices shown, 17 images]
[im 3/30  brain]
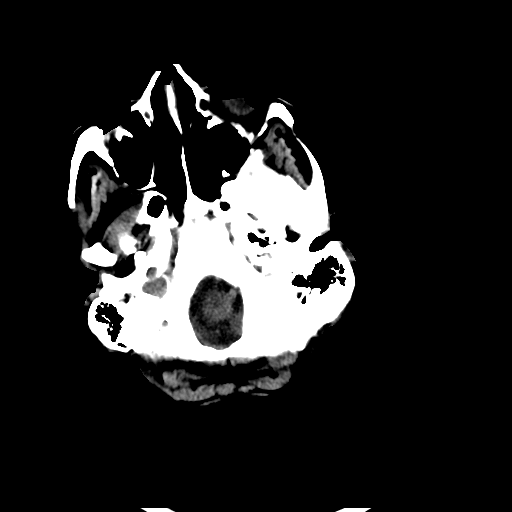
[im 3/30  bone]
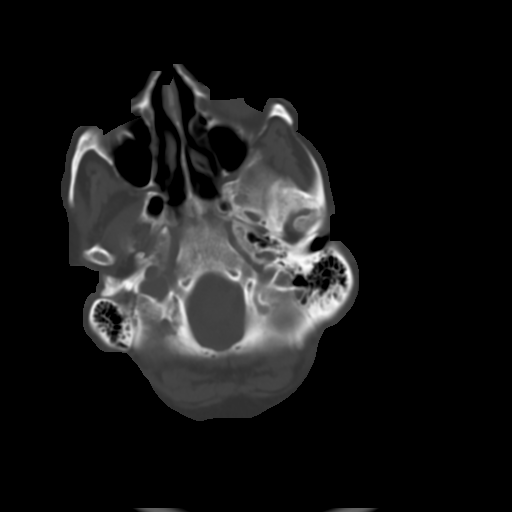
[im 5/30  brain]
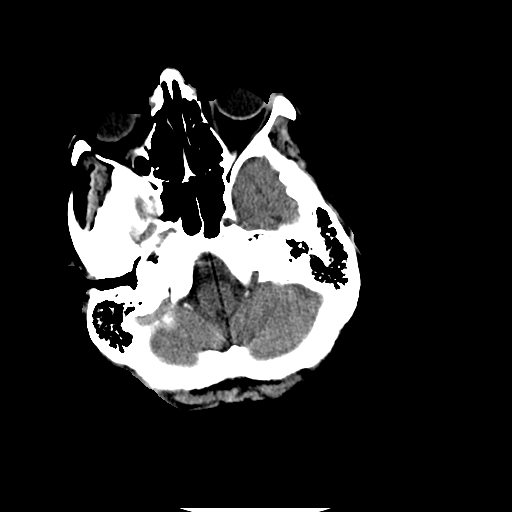
[im 7/30  brain]
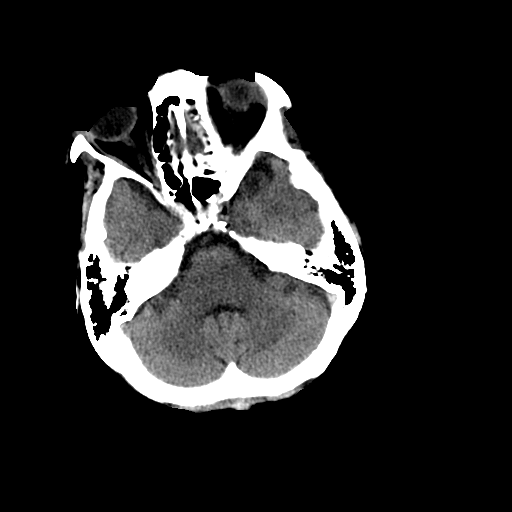
[im 9/30  brain]
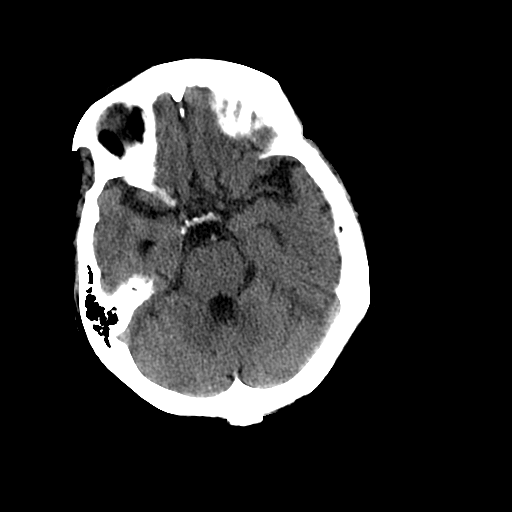
[im 11/30  brain]
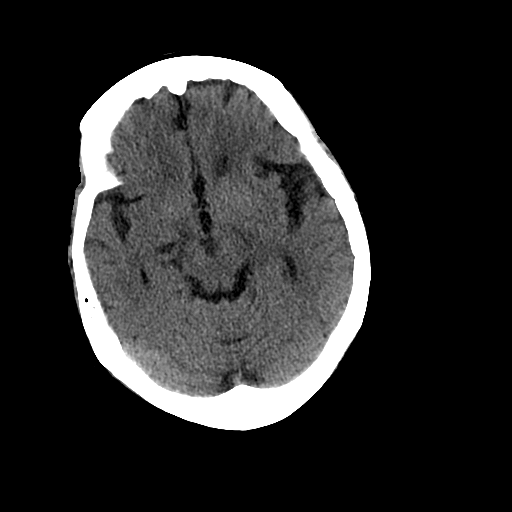
[im 11/30  bone]
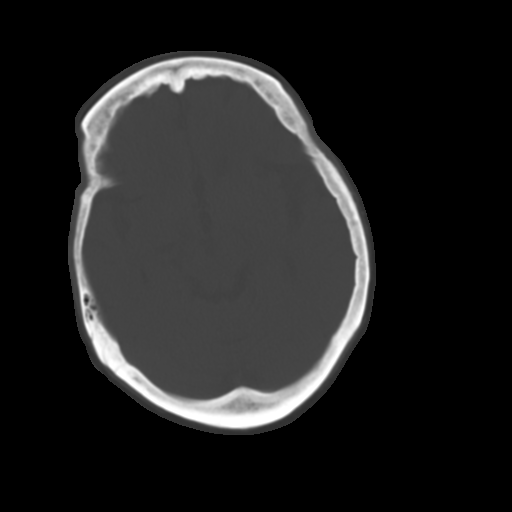
[im 13/30  brain]
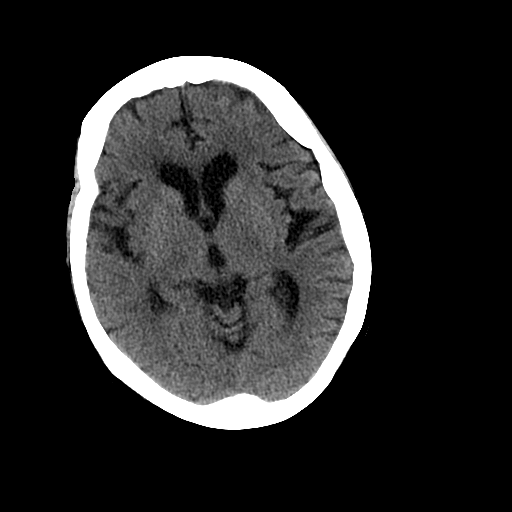
[im 15/30  brain]
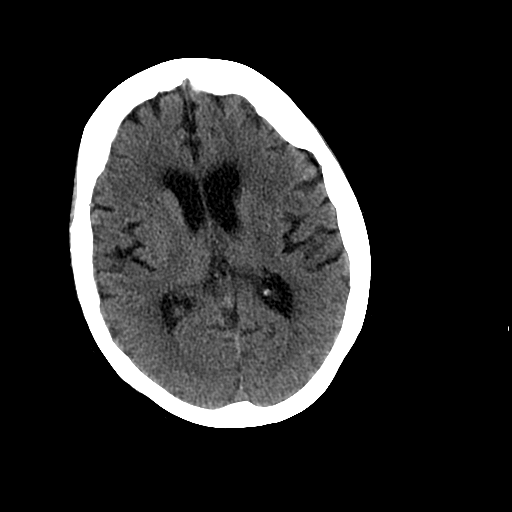
[im 17/30  brain]
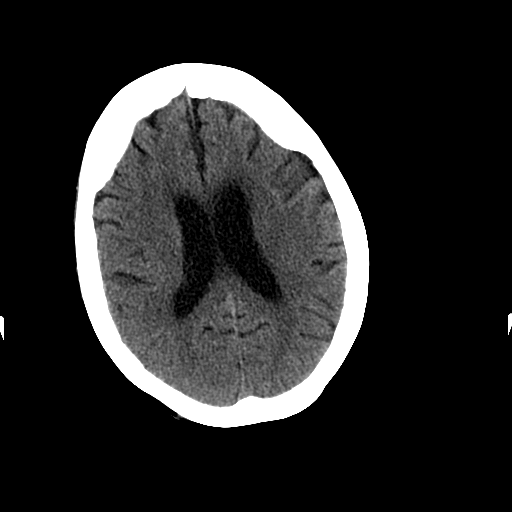
[im 19/30  brain]
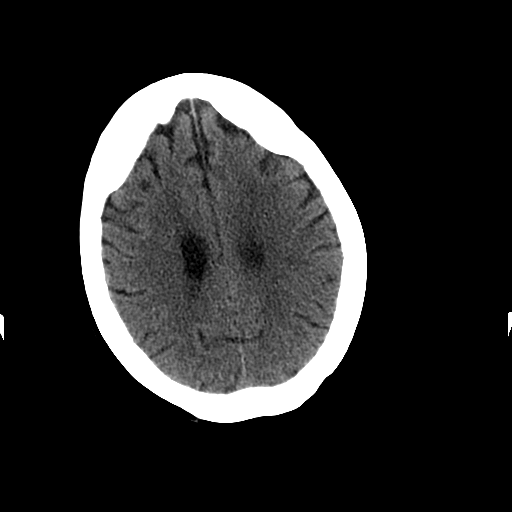
[im 19/30  bone]
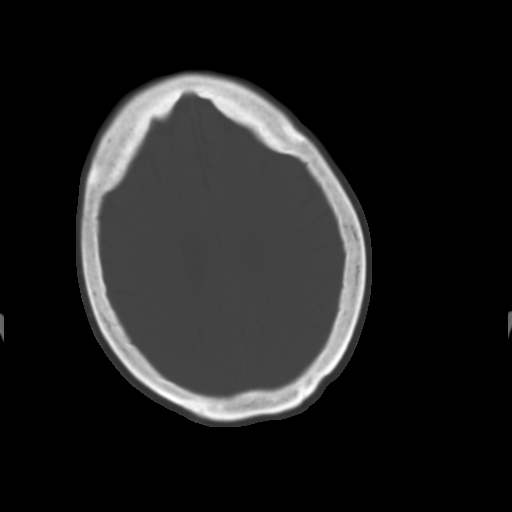
[im 21/30  brain]
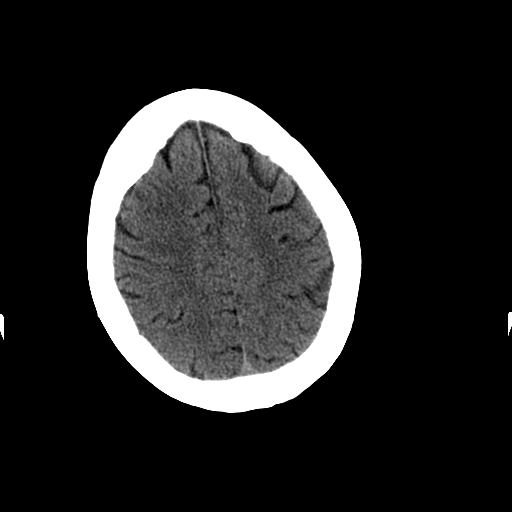
[im 23/30  brain]
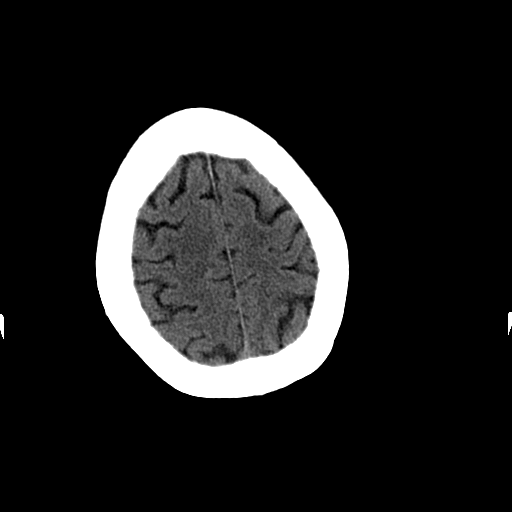
[im 25/30  brain]
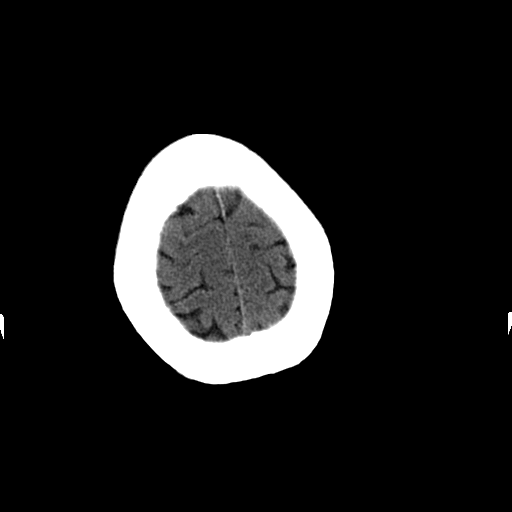
[im 27/30  brain]
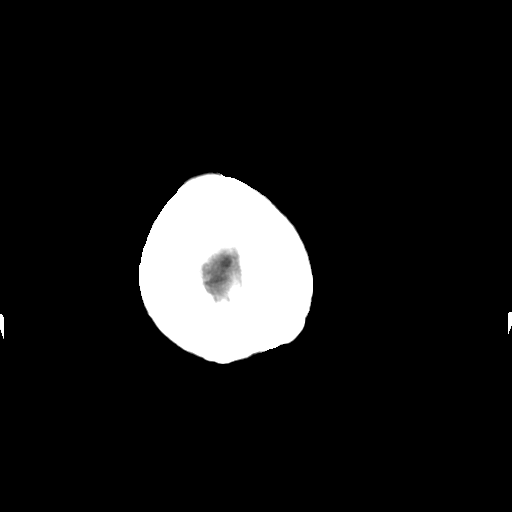
[im 27/30  bone]
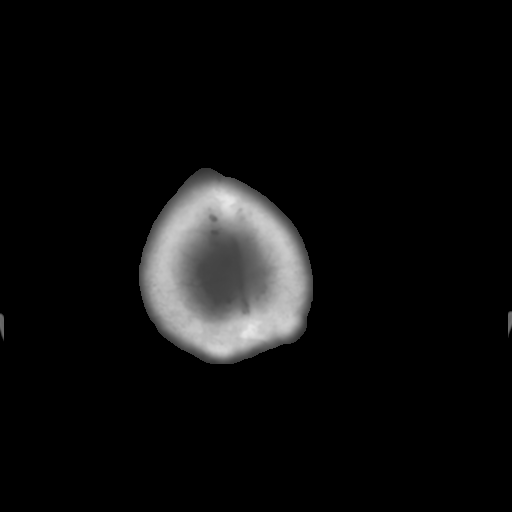

[Series 202: head w/o bone, idose (1) · axial · non-contrast · 0.49mm/px · z∈[+78,+118]mm · 3 of 30 slices shown]
[im 3/30  bone]
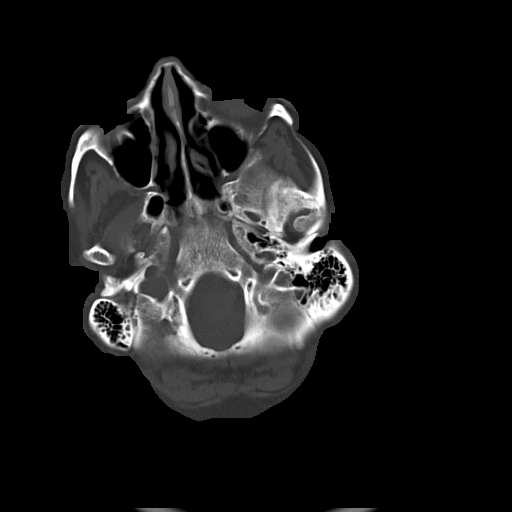
[im 7/30  bone]
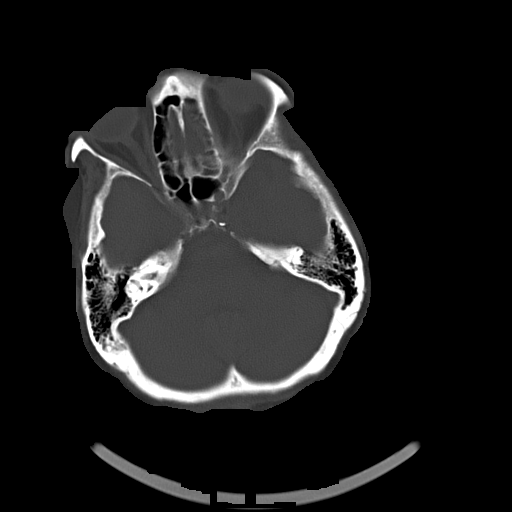
[im 11/30  bone]
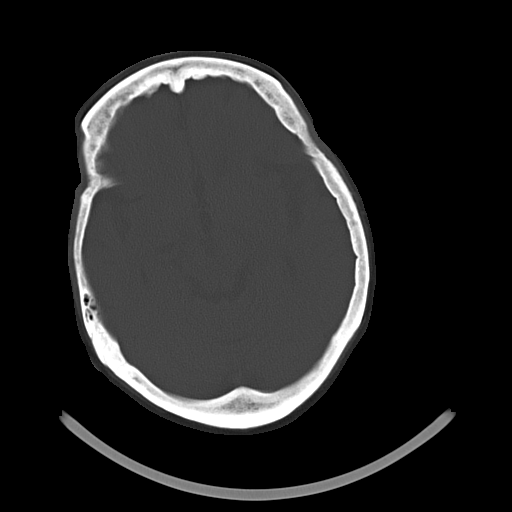

[16 of 30 positions shown; findings below may reference images not displayed]

FINDINGS: The ventricular system is slightly prominent as are the cortical
sulci, indicative of mild atrophy. The septum is midline in
position. No hemorrhage, mass lesion, or acute infarction is seen.
On bone window images, no acute calvarial abnormality is noted.
IMPRESSION: Mild atrophy.  No acute intracranial abnormality.

## 2017-03-27 IMAGING — CR DG CHEST 1V PORT
1 series · 1 of 1 positions shown · non-contrast
Comparison: 12/13/2013

CLINICAL DATA: Intermittent shortness of breath, weakness

EXAM:
PORTABLE CHEST 1 VIEW

[AP]
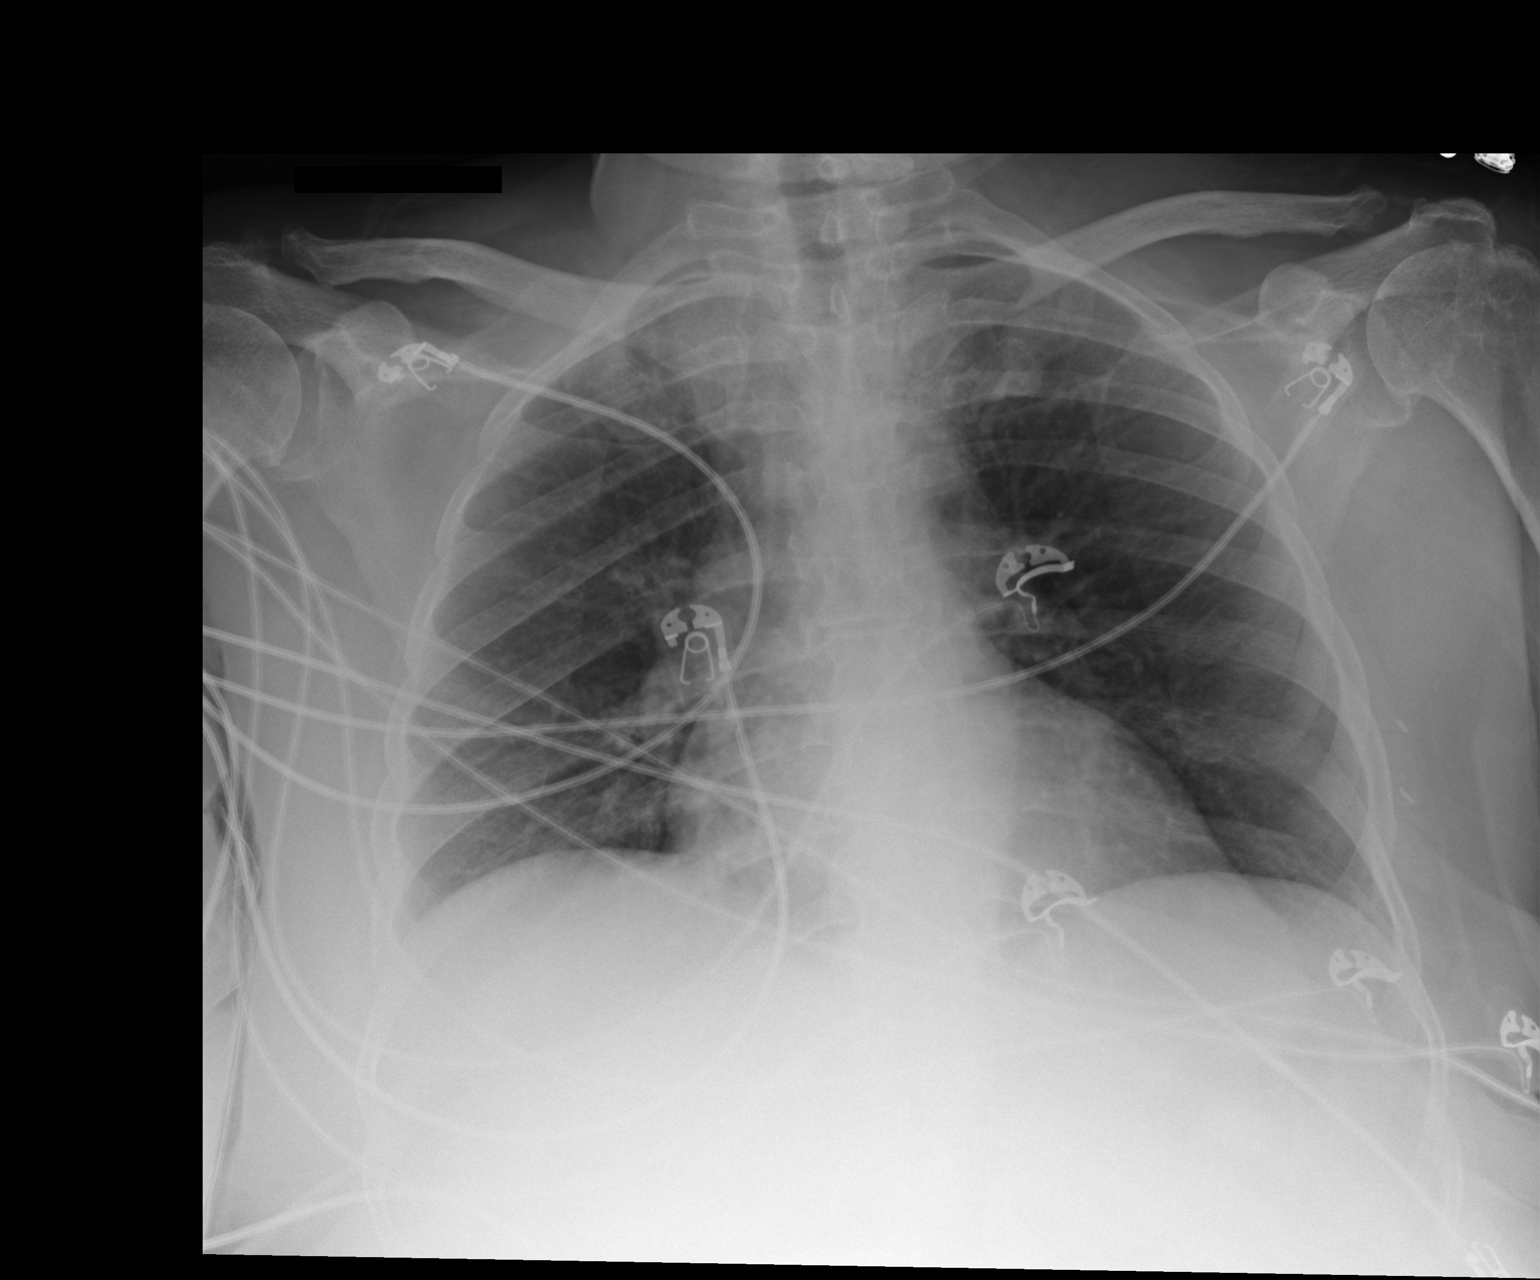

[1 of 1 positions shown; findings below may reference images not displayed]

FINDINGS: Cardiomediastinal silhouette is stable. No infiltrate or pleural
effusion. No pulmonary edema. Degenerative changes bilateral AC
joints.
IMPRESSION: No active disease.

## 2017-03-28 IMAGING — DX DG HIP (WITH OR WITHOUT PELVIS) 2-3V*L*
3 series · 3 of 3 positions shown · non-contrast
Comparison: None.

CLINICAL DATA: Anterior left hip pain.  No injury.

EXAM:
DG HIP (WITH OR WITHOUT PELVIS) 2-3V LEFT

[t pelvis ap]
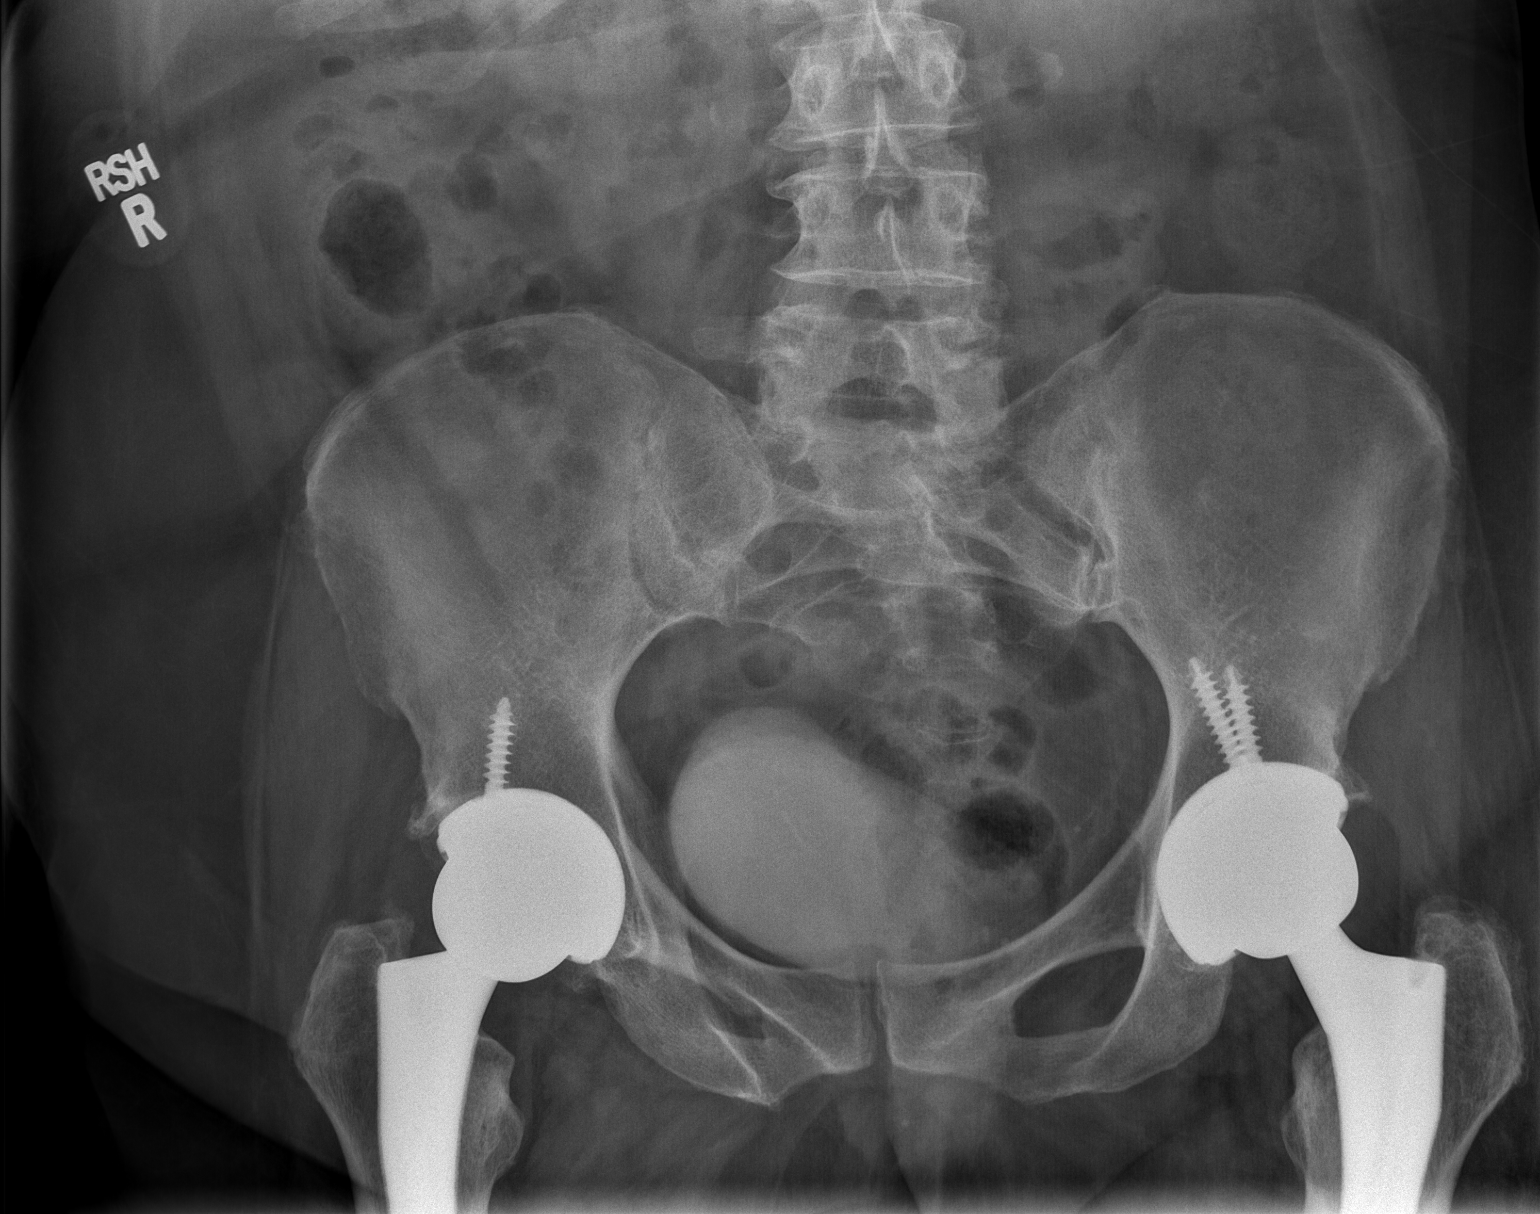

[t hip ap left]
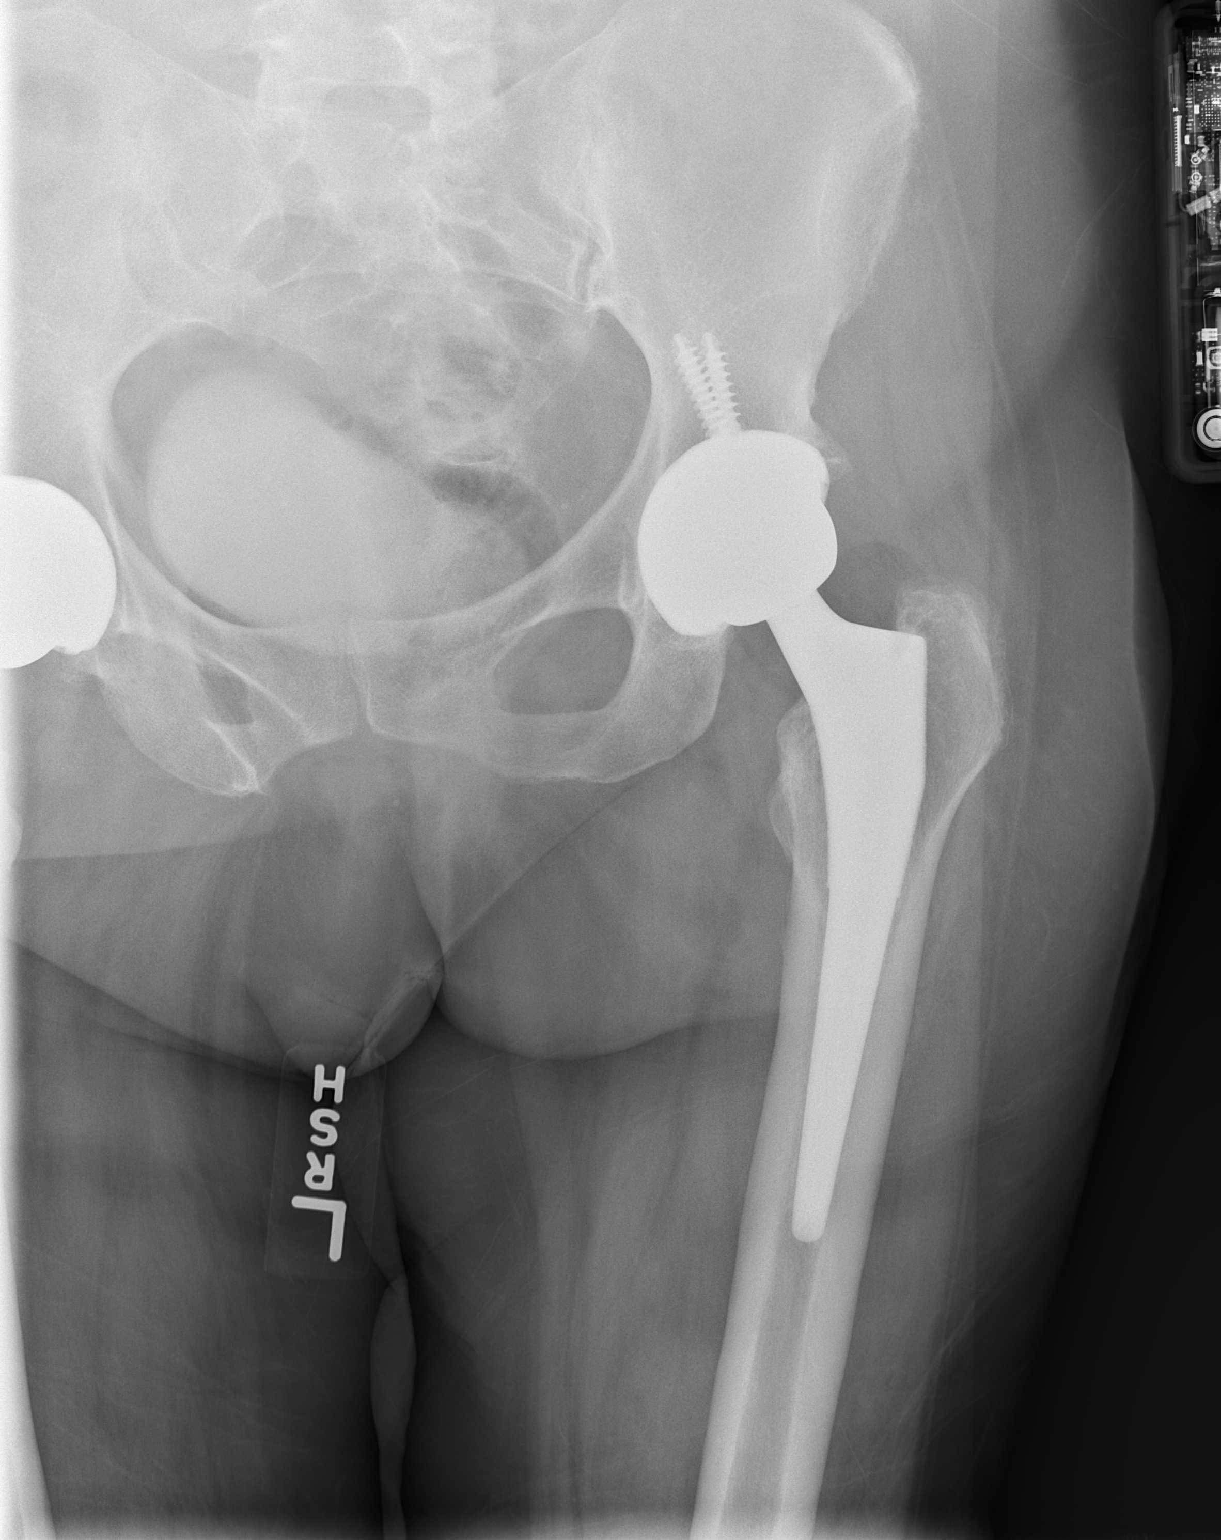

[t hip frog leg left]
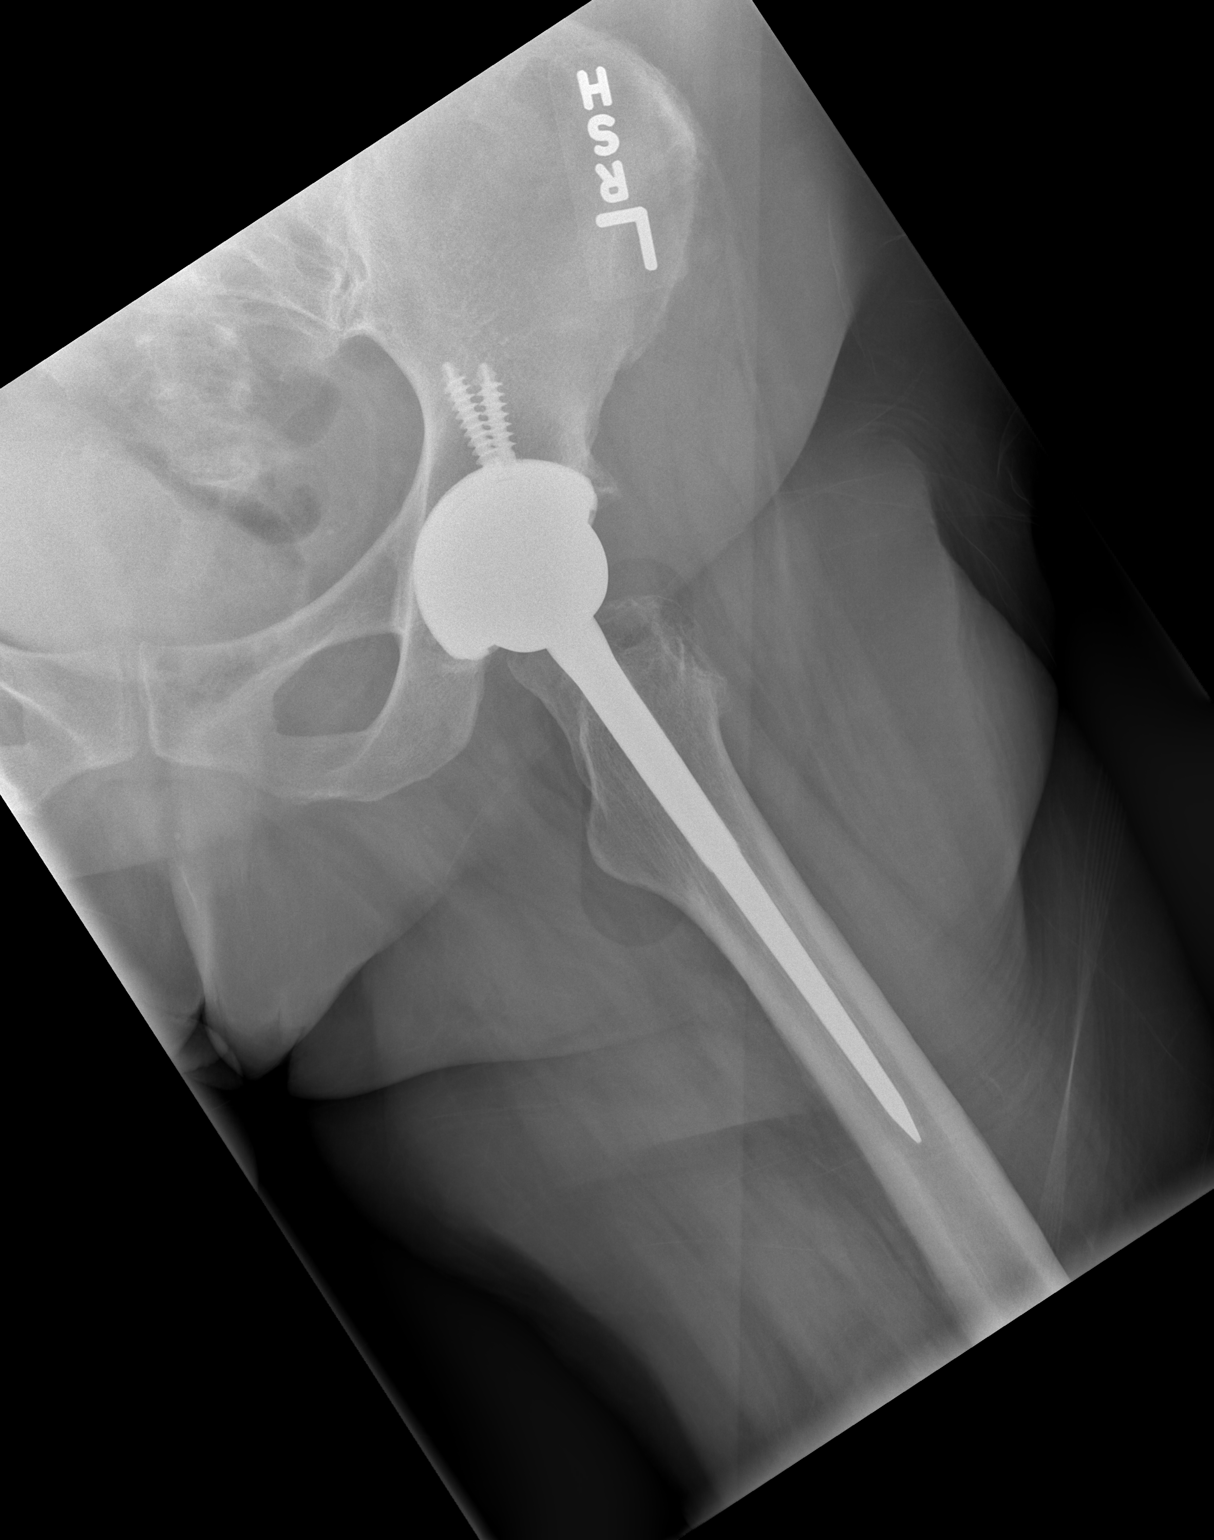

[3 of 3 positions shown; findings below may reference images not displayed]

FINDINGS: Patient has bilateral total hip arthroplasties. Both appear well
seated and aligned. There is no evidence of loosening of the left
hip prosthesis.

No fracture.  No bone lesion.

SI joints and symphysis pubis are normally spaced and aligned.

Soft tissues are unremarkable.
IMPRESSION: 1. No fracture or acute finding.
2. No evidence of loosening or malalignment of the left hip
prosthesis.

## 2017-05-17 ENCOUNTER — Other Ambulatory Visit: Payer: Self-pay | Admitting: *Deleted

## 2017-05-17 MED ORDER — ANASTROZOLE 1 MG PO TABS: 1.0000 mg | ORAL_TABLET | Freq: Every day | ORAL | 0 refills | Status: DC

## 2017-06-09 ENCOUNTER — Other Ambulatory Visit: Payer: Self-pay

## 2017-06-09 ENCOUNTER — Ambulatory Visit: Payer: Medicare Other | Admitting: Family Medicine

## 2017-06-09 ENCOUNTER — Encounter: Payer: Self-pay | Admitting: Family Medicine

## 2017-06-09 ENCOUNTER — Ambulatory Visit (INDEPENDENT_AMBULATORY_CARE_PROVIDER_SITE_OTHER): Payer: Medicare Other

## 2017-06-09 VITALS — BP 140/66 | HR 111 | Temp 98.2°F | Resp 17 | Ht 63.0 in | Wt 171.6 lb

## 2017-06-09 DIAGNOSIS — Y92009 Unspecified place in unspecified non-institutional (private) residence as the place of occurrence of the external cause: Secondary | ICD-10-CM

## 2017-06-09 DIAGNOSIS — R519 Headache, unspecified: Secondary | ICD-10-CM

## 2017-06-09 DIAGNOSIS — M21061 Valgus deformity, not elsewhere classified, right knee: Secondary | ICD-10-CM

## 2017-06-09 DIAGNOSIS — R51 Headache: Secondary | ICD-10-CM | POA: Diagnosis not present

## 2017-06-09 DIAGNOSIS — S0990XA Unspecified injury of head, initial encounter: Secondary | ICD-10-CM | POA: Diagnosis not present

## 2017-06-09 DIAGNOSIS — S0083XA Contusion of other part of head, initial encounter: Secondary | ICD-10-CM | POA: Diagnosis not present

## 2017-06-09 DIAGNOSIS — W19XXXA Unspecified fall, initial encounter: Secondary | ICD-10-CM

## 2017-06-09 NOTE — Progress Notes (Signed)
Chief Complaint  Patient presents with  . Fall    onset: 05/29/17, goose egg on right side of forehead, black eye and bruising.  Fell in tub and hit head and eye    HPI Fall at home 05/29/2017 Pt fell at home while getting out of the bathtub She has severe pain and weakness of the right knee with deformity and was advised to get knee replacement  She was trying to step out of the tub when she fell and in trying to break her fall she hit the right forehead but did not lose consciousness or vision. She did not feel dizzy before falling but had a headache and an "egg" on her forehead almost immediately after. She states that the raised bump slowly swelled and then felt tight and stopped getting bigger  She also had a black eye  No loc, no vision changes, no nose bleed  She lives with her daughter who reports that the patient rarely uses her walker and has to hold on to the walls for support. She states that her mother is fearful of the surgery. The patient reports that someone told her that the pain from the surgery is worse than the knee pain.  Her knee is not swollen but she has a lot of weakness.  Past Medical History:  Diagnosis Date  . Allergy   . Anxiety   . 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 Asthma   . Breast cancer ([place])    S/P left mastectomy 2015  . Depression   . Hypertension   . Migraine 1980's  . Type II diabetes mellitus ([place])     Current Outpatient Medications  Medication Sig Dispense Refill  . albuterol (PROVENTIL HFA;VENTOLIN HFA) 108 (90 BASE) MCG/ACT inhaler Inhale 1-2 puffs into the lungs every 4 (four) hours as needed for wheezing or shortness of breath.    Marland Kitchen amLODipine (NORVASC) 10 MG tablet TAKE 1 TABLET (10 MG TOTAL) BY MOUTH DAILY. 90 tablet 1  . anastrozole (ARIMIDEX) 1 MG tablet Take 1 tablet (1 mg total) daily by mouth. 30 tablet 0  . atenolol (TENORMIN) 50 MG tablet Take 2 tablets (100 mg total) by mouth daily. 180 tablet 1  . cetirizine (ZYRTEC) 10 MG  tablet Take 1 tablet (10 mg total) by mouth daily. 30 tablet 11  . Cholecalciferol (VITAMIN D PO) Take 1 capsule by mouth daily.    . indomethacin (INDOCIN) 50 MG capsule TAKE 1 CAPSULE (50 MG TOTAL) BY MOUTH 3 (THREE) TIMES DAILY AS NEEDED FOR MODERATE PAIN. 60 capsule 0  . metFORMIN (GLUCOPHAGE XR) 500 MG 24 hr tablet Take 1 tablet (500 mg total) by mouth daily. 90 tablet 1  . olopatadine (PATANOL) 0.1 % ophthalmic solution Place 1 drop into both eyes 2 (two) times daily. 5 mL 12  . ONE TOUCH ULTRA TEST test strip TEST BLOOD SUGAR ONCE DAILY. DX: E11.9 100 each 3  . vitamin B-12 (CYANOCOBALAMIN) 1000 MCG tablet Take 1 tablet (1,000 mcg total) by mouth daily. 30 tablet 3  . ALPRAZolam (XANAX) 0.25 MG tablet Take 1 tablet (0.25 mg total) by mouth 2 (two) times daily as needed for anxiety. (Patient not taking: Reported on 06/09/2017) 20 tablet 0  . magnesium oxide (MAG-OX) 400 MG tablet Take 1 tablet (400 mg total) by mouth 2 (two) times daily. For 10 days (Patient not taking: Reported on 06/09/2017) 20 tablet 0  . potassium chloride 20 MEQ TBCR Take 40 mEq by mouth  2 (two) times daily. 6 tablet 0   No current facility-administered medications for this visit.     Allergies:  Allergies  Allergen Reactions  . Ace Inhibitors Other (See Comments)    Acute renal failure  . Angiotensin Receptor Blockers Other (See Comments)    Acute Renal Failure    Past Surgical History:  Procedure Laterality Date  . ABDOMINAL HYSTERECTOMY  1981   "fibroids"  . ANKLE FRACTURE SURGERY Left 03/15/2000   with hardware  . APPLICATION OF WOUND VAC  01/17/2014   w/chest wall debridement  . APPLICATION OF WOUND VAC Left 01/17/2014   Procedure: PLACEMENT OF VAC DRESSING;  Surgeon: Merrie Roof, MD;  Location: [place];  Service: General;  Laterality: Left;  . BREAST SURGERY Left    removed due to BCA  . COLONOSCOPY    . JOINT REPLACEMENT    . MASTECTOMY COMPLETE / SIMPLE W/ SENTINEL NODE BIOPSY Left   . MASTECTOMY  W/ SENTINEL NODE BIOPSY Left 12/20/2013   Procedure: MASTECTOMY WITH SENTINEL LYMPH NODE BIOPSY;  Surgeon: Merrie Roof, MD;  Location: [place];  Service: General;  Laterality: Left;  . ORIF SHOULDER FRACTURE Left 1964  . ROTATOR CUFF REPAIR Right   . TOTAL HIP ARTHROPLASTY Left   . TOTAL HIP ARTHROPLASTY Right   . TUBAL LIGATION  1973  . WOUND DEBRIDEMENT Left 01/17/2014   Procedure: DEBRIDEMENT OF CHEST WALL;  Surgeon: Merrie Roof, MD;  Location: [facility];  Service: General;  Laterality: Left;    Social History   Socioeconomic History  . Marital status: Divorced    Spouse name: n/a  . Number of children: 1  . Years of education: 42  . Highest education level: None  Social Needs  . Financial resource strain: None  . Food insecurity - worry: None  . Food insecurity - inability: None  . Transportation needs - medical: None  . Transportation needs - non-medical: None  Occupational History  . Occupation: retired    Comment: Network engineer  Tobacco Use  . Smoking status: Former Smoker    Packs/day: 0.50    Years: 40.00    Pack years: 20.00    Types: Cigarettes    Last attempt to quit: 11/11/2000    Years since quitting: 16.5  . Smokeless tobacco: Never Used  Substance and Sexual Activity  . Alcohol use: Yes    Alcohol/week: 0.6 - 1.2 oz    Types: 1 - 2 Shots of liquor per week    Comment: weekly   . Drug use: Yes    Types: Marijuana    Comment: "last smoked marijuana in ~ 2010; recreational user only"  . Sexual activity: No  Other Topics Concern  . None  Social History Narrative   Her daughter and granddaughter live with her.    Family History  Problem Relation Age of Onset  . Asthma Mother   . Stroke Mother   . Asthma Father   . Alcohol abuse Father   . Colon cancer Neg Hx      ROS Review of Systems See HPI Constitution: No fevers or chills No malaise No diaphoresis Skin: No rash or itching Eyes: no blurry vision, no double vision GU: no  dysuria or hematuria Neuro: no dizziness or headaches all others reviewed and negative   Objective: Vitals:   06/09/17 1042  BP: 140/66  Pulse: (!) 111  Resp: 17  Temp: 98.2 F (36.8 C)  TempSrc: Oral  SpO2: 95%  Weight: 171 lb 9.6 oz (77.8 kg)  Height: 5\' 3"  (1.6 m)    Physical Exam  Constitutional: She is oriented to person, place, and time. She appears well-developed and well-nourished.  HENT:  Head: Atraumatic.    Right Ear: External ear normal.  Left Ear: External ear normal.  Mouth/Throat: Oropharynx is clear and moist.  Eyes: Conjunctivae and EOM are normal.  Limited fundoscopic exam, normal findings  Cardiovascular: Normal rate, regular rhythm and normal heart sounds.  No murmur heard. Pulmonary/Chest: Effort normal and breath sounds normal. No stridor. No respiratory distress.  Neurological: She is alert and oriented to person, place, and time.  Skin: Skin is warm. Capillary refill takes less than 2 seconds.  Psychiatric: She has a normal mood and affect. Her behavior is normal. Judgment and thought content normal.   Skull xray 06/09/2017 - negative for fracture or bony lesion, no orbital fracture  Assessment and Plan Armya was seen today for fall.  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 Brace but a walker is advised for all ambulation for now to prevent falls  Other orders -     Cancel: Flu  Vaccine QUAD 36+ mos IM -     Cancel: POCT urinalysis dipstick -     Cancel: Microalbumin, urine     Albertine Lafoy A Danford Tat

## 2017-06-09 NOTE — Patient Instructions (Addendum)
   IF you received an x-ray today, you will receive an invoice from [facility]. Please contact [facility] at [phone] with questions or concerns regarding your invoice.   IF you received labwork today, you will receive an invoice from [facility]. Please contact [facility] at [phone] with questions or concerns regarding your invoice.   Our billing staff will not be able to assist you with questions regarding bills from these companies.  You will be contacted with the lab results as soon as they are available. The fastest way to get your results is to activate your My Chart account. Instructions are located on the last page of this paperwork. If you have not heard from us regarding the results in 2 weeks, please contact this office.      Hematoma A hematoma is a collection of blood under the skin, in an organ, in a body space, in a joint space, or in other tissue. The blood can clot to form a lump that you can see and feel. The lump is often firm and may sometimes become sore and tender. Most hematomas get better in a few days to weeks. However, some hematomas may be serious and require medical care. Hematomas can range in size from very small to very large. What are the causes? A hematoma can be caused by a blunt or penetrating injury. It can also be caused by spontaneous leakage from a blood vessel under the skin. Spontaneous leakage from a blood vessel is more likely to occur in older people, especially those taking blood thinners. Sometimes, a hematoma can develop after certain medical procedures. What are the signs or symptoms?  A firm lump on the body.  Possible pain and tenderness in the area.  Bruising.Blue, dark blue, purple-red, or yellowish skin may appear at the site of the hematoma if the hematoma is close to the surface of the skin. For hematomas in deeper tissues or body spaces, the signs and symptoms may be subtle. For example, an intra-abdominal  hematoma may cause abdominal pain, weakness, fainting, and shortness of breath. An intracranial hematoma may cause a headache or symptoms such as weakness, trouble speaking, or a change in consciousness. How is this diagnosed? A hematoma can usually be diagnosed based on your medical history and a physical exam. Imaging tests may be needed if your health care provider suspects a hematoma in deeper tissues or body spaces, such as the abdomen, head, or chest. These tests may include ultrasonography or a CT scan. How is this treated? Hematomas usually go away on their own over time. Rarely does the blood need to be drained out of the body. Large hematomas or those that may affect vital organs will sometimes need surgical drainage or monitoring. Follow these instructions at home:  Apply ice to the injured area:  Put ice in a plastic bag.  Place a towel between your skin and the bag.  Leave the ice on for 20 minutes, 2-3 times a day for the first 1 to 2 days.  After the first 2 days, switch to using warm compresses on the hematoma.  Elevate the injured area to help decrease pain and swelling. Wrapping the area with an elastic bandage may also be helpful. Compression helps to reduce swelling and promotes shrinking of the hematoma. Make sure the bandage is not wrapped too tight.  If your hematoma is on a lower extremity and is painful, crutches may be helpful for a couple days.  Only take over-the-counter or prescription   medicines as directed by your health care provider. Get help right away if:  You have increasing pain, or your pain is not controlled with medicine.  You have a fever.  You have worsening swelling or discoloration.  Your skin over the hematoma breaks or starts bleeding.  Your hematoma is in your chest or abdomen and you have weakness, shortness of breath, or a change in consciousness.  Your hematoma is on your scalp (caused by a fall or injury) and you have a worsening  headache or a change in alertness or consciousness. This information is not intended to replace advice given to you by your health care provider. Make sure you discuss any questions you have with your health care provider. Document Released: 02/03/2004 Document Revised: 11/27/2015 Document Reviewed: 11/29/2012 Elsevier Interactive Patient Education  2017 Elsevier Inc.  

## 2017-06-20 ENCOUNTER — Telehealth: Payer: Self-pay | Admitting: Family Medicine

## 2017-06-20 NOTE — Telephone Encounter (Signed)
Copied from [place] [phone]. Topic: Referral - Question >> Jun 20, 2017 10:08 AM Synthia Innocent wrote: Reason for CRM: [facility] does not treat facial hematomas. Suggest plastic surgery _______________________________________________________  Do we want to switch to plastic surgery or try another General Surgeon? Please advise. Thanks!

## 2017-06-20 NOTE — Telephone Encounter (Signed)
Copied from [place] [phone]. Topic: Referral - Request >> Jun 20, 2017 11:13 AM Synthia Innocent wrote: Reason for CRM: Patient is requesting referral to [facility], for knot on her head  _______________________________________________________  Please advise. Thanks!

## 2017-06-21 ENCOUNTER — Telehealth: Payer: Self-pay

## 2017-06-21 NOTE — Telephone Encounter (Signed)
Copied from [place] [phone]. Topic: Referral - Request >> Jun 20, 2017 11:13 AM Synthia Innocent wrote: Reason for CRM: Patient is requesting referral to [facility], for knot on her head  >> Jun 21, 2017  8:06 AM Aurelio Brash B wrote: Pt called for update on referral.

## 2017-06-22 NOTE — Telephone Encounter (Signed)
Copied from [place] [phone]. Topic: Referral - Request >> Jun 21, 2017  2:00 PM Pricilla Handler wrote: Reason for CRM: Patient called requesting a referral for [facility] ASAP. Patient stated that they are willing and able to treat the Hematoma on her head. Please call patient ASAP.       Thank You!!!  Pt is requesting a referral for neuro.  Please advise thank you.

## 2017-06-27 NOTE — Telephone Encounter (Signed)
Spoke to the patient and let her know that she did not need to see Neurology at this time since her fall was not from loss of consciousness or altered mental status.   She was to see General Surgery for a hematoma on her head She states that it is resolving.   Patient got frustrated during the phone call. She is unsure who she is seeing and why.   Patient is under the impression that she needs to see Neurology.  She does not need neurology.  If the knot on her head has resolved the referral can be cancelled.    Referrals can you patiently follow up with this patient.   Dr. Nolon Rod

## 2017-06-30 NOTE — Telephone Encounter (Signed)
Spoke with patient regarding this referral and she said her hematoma is resolving. At this point I do not think an appointment is necessary. I would like her to come back in to the office for reassessment.  She had some confusion about the referrals as well.

## 2017-07-01 NOTE — Telephone Encounter (Signed)
I called the patient and she stated she has been wearing a headband and it's just about gone, you can't hardly see it. She is going away for the weekend and said to check back with her after the New Year.

## 2017-08-25 ENCOUNTER — Other Ambulatory Visit: Payer: Self-pay | Admitting: Physician Assistant

## 2017-10-05 ENCOUNTER — Encounter: Payer: Self-pay | Admitting: Physician Assistant

## 2017-11-29 ENCOUNTER — Other Ambulatory Visit: Payer: Self-pay | Admitting: *Deleted

## 2017-11-29 MED ORDER — ANASTROZOLE 1 MG PO TABS: 1.0000 mg | ORAL_TABLET | Freq: Every day | ORAL | 3 refills | Status: DC

## 2017-12-12 ENCOUNTER — Other Ambulatory Visit: Payer: Self-pay | Admitting: Physician Assistant

## 2017-12-12 NOTE — Telephone Encounter (Signed)
Refill request One Touch test strips  LOV 06-09-2017  Dr Nolon Rod  Facial Pain   Last filled  05/25 /2018 [facility] [place] 100 strips 3 refills  Test daily   Pharmacy on File

## 2017-12-13 ENCOUNTER — Other Ambulatory Visit: Payer: Self-pay | Admitting: Family Medicine

## 2017-12-13 DIAGNOSIS — Z8739 Personal history of other diseases of the musculoskeletal system and connective tissue: Secondary | ICD-10-CM

## 2017-12-14 ENCOUNTER — Encounter: Payer: Self-pay | Admitting: Family Medicine

## 2017-12-14 ENCOUNTER — Ambulatory Visit: Payer: Medicare Other | Admitting: Family Medicine

## 2017-12-14 ENCOUNTER — Other Ambulatory Visit: Payer: Self-pay

## 2017-12-14 VITALS — BP 92/60 | HR 98 | Temp 98.6°F | Resp 17 | Ht 63.0 in | Wt 163.0 lb

## 2017-12-14 DIAGNOSIS — M21061 Valgus deformity, not elsewhere classified, right knee: Secondary | ICD-10-CM

## 2017-12-14 DIAGNOSIS — R630 Anorexia: Secondary | ICD-10-CM

## 2017-12-14 DIAGNOSIS — Z79899 Other long term (current) drug therapy: Secondary | ICD-10-CM | POA: Diagnosis not present

## 2017-12-14 DIAGNOSIS — M10071 Idiopathic gout, right ankle and foot: Secondary | ICD-10-CM

## 2017-12-14 DIAGNOSIS — Z9181 History of falling: Secondary | ICD-10-CM

## 2017-12-14 DIAGNOSIS — E119 Type 2 diabetes mellitus without complications: Secondary | ICD-10-CM

## 2017-12-14 DIAGNOSIS — Z8739 Personal history of other diseases of the musculoskeletal system and connective tissue: Secondary | ICD-10-CM

## 2017-12-14 DIAGNOSIS — R634 Abnormal weight loss: Secondary | ICD-10-CM | POA: Diagnosis not present

## 2017-12-14 LAB — POCT GLYCOSYLATED HEMOGLOBIN (HGB A1C): Hemoglobin A1C: 6.2 % — AB (ref 4.0–5.6)

## 2017-12-14 MED ORDER — INDOMETHACIN 50 MG PO CAPS: 50.0000 mg | ORAL_CAPSULE | Freq: Three times a day (TID) | ORAL | 0 refills | Status: DC | PRN

## 2017-12-14 NOTE — Progress Notes (Signed)
Chief Complaint  Patient presents with  . gout flare    onset: monday 6/10 woke up with flare in right and now has moved into left foot also.  Out of gout med and needs refill    HPI  Gout and Right knee genu valgum Patient reports that she has been having pain in her right foot She has a history of genu valgum of the right knee with weakness on the side She reports that she has pain in the top of the right food She denies taking any pain meds because she didn't have anything that worked Indomethacin works for her [facility] Readings from Last 3 Encounters:  12/14/17 163 lb (73.9 kg)  06/09/17 171 lb 9.6 oz (77.8 kg)  12/21/16 170 lb (77.1 kg)    Hypotension She has not eaten much today but she took her bp meds She denies dizziness, shortness of breath of chest pain She took her bp meds and reports that she has not been eating much which is the norm for her She has not been drinking much water either BP Readings from Last 3 Encounters:  12/14/17 92/60  06/09/17 140/66  12/21/16 122/79    Diabetes Mellitus: Patient presents for follow up of diabetes. Symptoms: weight loss. Symptoms have stabilized. Patient denies hyperglycemia, hypoglycemia , increase appetite and paresthesia of the feet.  Evaluation to date has been included: hemoglobin A1C.  Home sugars: patient does not check sugars.   Lab Results  Component Value Date   HGBA1C 6.2 (A) 12/14/2017     Past Medical History:  Diagnosis Date  . Allergy   . Anxiety   . 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 Asthma   . Breast cancer ([place])    S/P left mastectomy 2015  . Depression   . Hypertension   . Migraine 1980's  . Type II diabetes mellitus ([place])     Current Outpatient Medications  Medication Sig Dispense Refill  . albuterol (PROVENTIL HFA;VENTOLIN HFA) 108 (90 BASE) MCG/ACT inhaler Inhale 1-2 puffs into the lungs every 4 (four) hours as needed for wheezing or shortness of breath.    . ALPRAZolam (XANAX) 0.25 MG tablet  Take 1 tablet (0.25 mg total) by mouth 2 (two) times daily as needed for anxiety. 20 tablet 0  . anastrozole (ARIMIDEX) 1 MG tablet Take 1 tablet (1 mg total) by mouth daily. 30 tablet 3  . atenolol (TENORMIN) 50 MG tablet Take 2 tablets (100 mg total) by mouth daily. 180 tablet 1  . cetirizine (ZYRTEC) 10 MG tablet Take 1 tablet (10 mg total) by mouth daily. 30 tablet 11  . Cholecalciferol (VITAMIN D PO) Take 1 capsule by mouth daily.    Marland Kitchen glucose blood (ONE TOUCH ULTRA TEST) test strip TEST BLOOD SUGAR ONCE DAILY. DX: E11.9 100 each 1  . indomethacin (INDOCIN) 50 MG capsule Take 1 capsule (50 mg total) by mouth 3 (three) times daily as needed for moderate pain. 60 capsule 1  . olopatadine (PATANOL) 0.1 % ophthalmic solution Place 1 drop into both eyes 2 (two) times daily. 5 mL 12  . Potassium Chloride ER 20 MEQ TBCR Take 40 mEq by mouth 2 (two) times daily for 3 days. 6 tablet 0  . vitamin B-12 (CYANOCOBALAMIN) 1000 MCG tablet Take 1 tablet (1,000 mcg total) by mouth daily. 30 tablet 3   No current facility-administered medications for this visit.     Allergies:  Allergies  Allergen Reactions  . Ace Inhibitors  Other (See Comments)    Acute renal failure  . Angiotensin Receptor Blockers Other (See Comments)    Acute Renal Failure    Past Surgical History:  Procedure Laterality Date  . ABDOMINAL HYSTERECTOMY  1981   "fibroids"  . ANKLE FRACTURE SURGERY Left 03/15/2000   with hardware  . APPLICATION OF WOUND VAC  01/17/2014   w/chest wall debridement  . APPLICATION OF WOUND VAC Left 01/17/2014   Procedure: PLACEMENT OF VAC DRESSING;  Surgeon: Merrie Roof, MD;  Location: [place];  Service: General;  Laterality: Left;  . BREAST SURGERY Left    removed due to BCA  . COLONOSCOPY    . JOINT REPLACEMENT    . MASTECTOMY COMPLETE / SIMPLE W/ SENTINEL NODE BIOPSY Left   . MASTECTOMY W/ SENTINEL NODE BIOPSY Left 12/20/2013   Procedure: MASTECTOMY WITH SENTINEL LYMPH NODE BIOPSY;  Surgeon:  Merrie Roof, MD;  Location: [place];  Service: General;  Laterality: Left;  . ORIF SHOULDER FRACTURE Left 1964  . ROTATOR CUFF REPAIR Right   . TOTAL HIP ARTHROPLASTY Left   . TOTAL HIP ARTHROPLASTY Right   . TUBAL LIGATION  1973  . WOUND DEBRIDEMENT Left 01/17/2014   Procedure: DEBRIDEMENT OF CHEST WALL;  Surgeon: Merrie Roof, MD;  Location: [facility] OR;  Service: General;  Laterality: Left;    Social History   Socioeconomic History  . Marital status: Divorced    Spouse name: n/a  . Number of children: 1  . Years of education: 58  . Highest education level: Not on file  Occupational History  . Occupation: retired    Comment: Network engineer  Social Needs  . Financial resource strain: Not on file  . Food insecurity:    Worry: Not on file    Inability: Not on file  . Transportation needs:    Medical: Not on file    Non-medical: Not on file  Tobacco Use  . Smoking status: Former Smoker    Packs/day: 0.50    Years: 40.00    Pack years: 20.00    Types: Cigarettes    Last attempt to quit: 11/11/2000    Years since quitting: 17.1  . Smokeless tobacco: Never Used  Substance and Sexual Activity  . Alcohol use: Yes    Alcohol/week: 0.6 - 1.2 oz    Types: 1 - 2 Shots of liquor per week    Comment: weekly   . Drug use: Yes    Types: Marijuana    Comment: "last smoked marijuana in ~ 2010; recreational user only"  . Sexual activity: Never  Lifestyle  . Physical activity:    Days per week: Not on file    Minutes per session: Not on file  . Stress: Not on file  Relationships  . Social connections:    Talks on phone: Not on file    Gets together: Not on file    Attends religious service: Not on file    Active member of club or organization: Not on file    Attends meetings of clubs or organizations: Not on file    Relationship status: Not on file  Other Topics Concern  . Not on file  Social History Narrative   Her daughter and granddaughter live with her.     Family History  Problem Relation Age of Onset  . Asthma Mother   . Stroke Mother   . Asthma Father   . Alcohol abuse Father   .  Colon cancer Neg Hx      ROS Review of Systems See HPI Constitution: No fevers or chills No malaise No diaphoresis Skin: No rash or itching Eyes: no blurry vision, no double vision GU: no dysuria or hematuria Neuro: no dizziness or headaches all others reviewed and negative   Objective: Vitals:   12/14/17 1632  BP: 92/60  Pulse: 98  Resp: 17  Temp: 98.6 F (37 C)  TempSrc: Oral  SpO2: 93%  Weight: 163 lb (73.9 kg)  Height: 5\' 3"  (1.6 m)    Physical Exam  Constitutional: She is oriented to person, place, and time. She appears well-developed and well-nourished.  HENT:  Head: Normocephalic and atraumatic.  Eyes: Conjunctivae and EOM are normal.  Cardiovascular: Normal rate, regular rhythm and normal heart sounds.  No murmur heard. Pulmonary/Chest: Effort normal and breath sounds normal. No stridor. No respiratory distress. She has no wheezes.  Musculoskeletal:  Right knee wrapped but with weakness and laxity. No effusion. Right foot with edema to the ankle. Left knee normal. Left foot normal.   Neurological: She is alert and oriented to person, place, and time.  Skin: Skin is warm. Capillary refill takes less than 2 seconds.  Psychiatric: She has a normal mood and affect. Her behavior is normal. Judgment and thought content normal.    Assessment and Plan Keon was seen today for gout flare.  Diagnoses and all orders for this visit:  Acquired genu valgum of right knee At risk for falls Advised to use Walker to assist with ambulation  Follow up with Orthopedics  Well controlled diabetes mellitus ([place])- tightly controlled Discontinued metformin  -     Microalbumin, urine -     POCT glycosylated hemoglobin (Hb A1C) -     HM Diabetes Foot Exam -     Comprehensive metabolic panel  Acute idiopathic gout of right foot- normal  renal function, advised short course of indomethacin -     Cancel: Comprehensive metabolic panel -     Comprehensive metabolic panel  Poor appetite Unintentional weight loss- increase intake of foods by eating frequent small meals  Polypharmacy - discontinued meds today Stopped metformin due to weight loss and very well controlled diabetes Discontinued amlodipine due to hypotension  History of gout -     Discontinue: indomethacin (INDOCIN) 50 MG capsule; Take 1 capsule (50 mg total) by mouth 3 (three) times daily as needed for moderate pain. -     indomethacin (INDOCIN) 50 MG capsule; Take 1 capsule (50 mg total) by mouth 3 (three) times daily as needed for moderate pain.  Other orders -     Potassium Chloride ER 20 MEQ TBCR; Take 40 mEq by mouth 2 (two) times daily for 3 days.   Discontinued metformin due to weight loss, poor appetite and a1c of 5.8%   Iretha Kirley A Maya Arcand

## 2017-12-14 NOTE — Patient Instructions (Addendum)
   Stop amlodipine Stop metformin  Take indomethacin for gout pain  Follow up in one month  IF you received an x-ray today, you will receive an invoice from [facility]. Please contact [facility] at [phone] with questions or concerns regarding your invoice.   IF you received labwork today, you will receive an invoice from [facility]. Please contact [facility] at [phone] with questions or concerns regarding your invoice.   Our billing staff will not be able to assist you with questions regarding bills from these companies.  You will be contacted with the lab results as soon as they are available. The fastest way to get your results is to activate your My Chart account. Instructions are located on the last page of this paperwork. If you have not heard from [place] regarding the results in 2 weeks, please contact this office.

## 2017-12-15 ENCOUNTER — Telehealth: Payer: Self-pay | Admitting: General Practice

## 2017-12-15 ENCOUNTER — Encounter: Payer: Self-pay | Admitting: Family Medicine

## 2017-12-15 LAB — COMPREHENSIVE METABOLIC PANEL
ALBUMIN: 3.8 g/dL (ref 3.5–4.8)
ALK PHOS: 59 IU/L (ref 39–117)
ALT: 6 IU/L (ref 0–32)
AST: 15 IU/L (ref 0–40)
Albumin/Globulin Ratio: 1.4 (ref 1.2–2.2)
BUN / CREAT RATIO: 7 — AB (ref 12–28)
BUN: 7 mg/dL — ABNORMAL LOW (ref 8–27)
Bilirubin Total: 0.7 mg/dL (ref 0.0–1.2)
CALCIUM: 9.7 mg/dL (ref 8.7–10.3)
CO2: 30 mmol/L — AB (ref 20–29)
CREATININE: 0.94 mg/dL (ref 0.57–1.00)
Chloride: 89 mmol/L — ABNORMAL LOW (ref 96–106)
GFR calc Af Amer: 70 mL/min/{1.73_m2} (ref >59)
GFR, EST NON AFRICAN AMERICAN: 60 mL/min/{1.73_m2} (ref >59)
GLUCOSE: 172 mg/dL — AB (ref 65–99)
Globulin, Total: 2.7 g/dL (ref 1.5–4.5)
Potassium: 2.8 mmol/L — ABNORMAL LOW (ref 3.5–5.2)
Sodium: 137 mmol/L (ref 134–144)
Total Protein: 6.5 g/dL (ref 6.0–8.5)

## 2017-12-15 MED ORDER — POTASSIUM CHLORIDE ER 20 MEQ PO TBCR: 40.0000 meq | EXTENDED_RELEASE_TABLET | Freq: Two times a day (BID) | ORAL | 0 refills | Status: DC

## 2017-12-15 MED ORDER — INDOMETHACIN 50 MG PO CAPS: 50.0000 mg | ORAL_CAPSULE | Freq: Three times a day (TID) | ORAL | 1 refills | Status: DC | PRN

## 2017-12-15 NOTE — Telephone Encounter (Signed)
Copied from [place] [id number] [phone]. Topic: Quick Communication - See Telephone Encounter >> Dec 15, 2017  2:05 PM Mylinda Latina, NT wrote: CRM for notification. See Telephone encounter for: 12/15/17.  Patient called and states that she called the pharmacy today and they told her that her indomethacin (INDOCIN) 50 MG capsule needed prior auth before they could fill this. Please call patient when completed and ready CB # [phone] and her daughter # is [phone].

## 2017-12-15 NOTE — Telephone Encounter (Signed)
Message to Inez for PA

## 2017-12-16 LAB — MICROALBUMIN, URINE: MICROALBUM., U, RANDOM: 24.6 ug/mL

## 2017-12-20 NOTE — Telephone Encounter (Signed)
Pa started [id number]

## 2017-12-20 NOTE — Telephone Encounter (Signed)
Pa started [id number]

## 2017-12-30 NOTE — Telephone Encounter (Signed)
Called patient and she stated it was filled

## 2018-03-30 NOTE — Telephone Encounter (Signed)
Signing encounter °

## 2018-11-30 ENCOUNTER — Encounter: Payer: Self-pay | Admitting: Gastroenterology

## 2019-09-17 ENCOUNTER — Ambulatory Visit: Payer: Medicare Other | Attending: Internal Medicine

## 2019-09-17 DIAGNOSIS — Z23 Encounter for immunization: Secondary | ICD-10-CM

## 2019-09-17 NOTE — Progress Notes (Signed)
   Covid-19 Vaccination Clinic  Name:  [name]    MRN: [id number] DOB: 05-Oct-1945  09/17/2019  [name] was observed post Covid-19 immunization for 15 minutes without incident. She was provided with Vaccine Information Sheet and instruction to access the V-Safe system.   [name] was instructed to call 911 with any severe reactions post vaccine: Marland Kitchen Difficulty breathing  . Swelling of face and throat  . A fast heartbeat  . A bad rash all over body  . Dizziness and weakness   Immunizations Administered    Name Date Dose VIS Date Route   Pfizer COVID-19 Vaccine 09/17/2019 12:07 PM 0.3 mL 06/15/2019 Intramuscular   Manufacturer: [place]   Lot: [id number]   [place]: [id number]

## 2019-10-10 ENCOUNTER — Ambulatory Visit: Payer: Self-pay | Attending: Internal Medicine

## 2019-10-10 DIAGNOSIS — Z23 Encounter for immunization: Secondary | ICD-10-CM

## 2019-10-10 NOTE — Progress Notes (Signed)
   Covid-19 Vaccination Clinic  Name:  [name]    MRN: [id number] DOB: February 06, 1946  10/10/2019  [name] was observed post Covid-19 immunization for 15 minutes without incident. She was provided with Vaccine Information Sheet and instruction to access the V-Safe system.   [name] was instructed to call 911 with any severe reactions post vaccine: Marland Kitchen Difficulty breathing  . Swelling of face and throat  . A fast heartbeat  . A bad rash all over body  . Dizziness and weakness   Immunizations Administered    Name Date Dose VIS Date Route   Pfizer COVID-19 Vaccine 10/10/2019 12:35 PM 0.3 mL 06/15/2019 Intramuscular   Manufacturer: [place]   Lot: [id number]   [place]: [id number]

## 2019-12-13 ENCOUNTER — Telehealth: Payer: Self-pay | Admitting: Family Medicine

## 2019-12-13 ENCOUNTER — Other Ambulatory Visit: Payer: Self-pay | Admitting: Emergency Medicine

## 2019-12-13 DIAGNOSIS — R634 Abnormal weight loss: Secondary | ICD-10-CM

## 2019-12-13 DIAGNOSIS — E782 Mixed hyperlipidemia: Secondary | ICD-10-CM

## 2019-12-13 DIAGNOSIS — E119 Type 2 diabetes mellitus without complications: Secondary | ICD-10-CM

## 2019-12-13 DIAGNOSIS — I10 Essential (primary) hypertension: Secondary | ICD-10-CM

## 2019-12-13 NOTE — Telephone Encounter (Signed)
Lab has been ordered.

## 2019-12-13 NOTE — Telephone Encounter (Signed)
Pt has an upcoming cpe on 02/04/20 and a nurse visit for her labs at on 01/30/20. Please put her orders in.

## 2020-01-25 ENCOUNTER — Encounter: Payer: Medicare Other | Admitting: Family Medicine

## 2020-01-29 ENCOUNTER — Other Ambulatory Visit: Payer: Self-pay

## 2020-01-29 ENCOUNTER — Ambulatory Visit
Admission: RE | Admit: 2020-01-29 | Discharge: 2020-01-29 | Disposition: A | Discharge status: Home / Self Care | Payer: Medicare HMO | Source: Ambulatory Visit | Attending: Nurse Practitioner | Admitting: Nurse Practitioner

## 2020-01-29 ENCOUNTER — Other Ambulatory Visit: Payer: Self-pay | Admitting: Nurse Practitioner

## 2020-01-29 DIAGNOSIS — M25561 Pain in right knee: Secondary | ICD-10-CM

## 2020-01-30 ENCOUNTER — Ambulatory Visit: Payer: Medicare Other

## 2020-02-04 ENCOUNTER — Encounter: Payer: Medicare Other | Admitting: Family Medicine

## 2020-02-12 ENCOUNTER — Encounter: Payer: Self-pay | Admitting: Cardiology

## 2020-02-12 ENCOUNTER — Other Ambulatory Visit: Payer: Self-pay

## 2020-02-12 ENCOUNTER — Ambulatory Visit: Payer: Medicare Other | Admitting: Cardiology

## 2020-02-12 VITALS — BP 143/64 | HR 83 | Ht 64.0 in | Wt 158.0 lb

## 2020-02-12 DIAGNOSIS — I10 Essential (primary) hypertension: Secondary | ICD-10-CM | POA: Diagnosis not present

## 2020-02-12 DIAGNOSIS — M7989 Other specified soft tissue disorders: Secondary | ICD-10-CM | POA: Diagnosis not present

## 2020-02-12 DIAGNOSIS — R9431 Abnormal electrocardiogram [ECG] [EKG]: Secondary | ICD-10-CM

## 2020-02-12 DIAGNOSIS — R011 Cardiac murmur, unspecified: Secondary | ICD-10-CM

## 2020-02-12 DIAGNOSIS — Z87891 Personal history of nicotine dependence: Secondary | ICD-10-CM | POA: Diagnosis not present

## 2020-02-12 NOTE — Progress Notes (Signed)
Date:  02/16/2020   ID:  [place], DOB 1946-03-27, MRN [id number]  PCP:  Jordan Hawks, NP  Cardiologist:  Rex Kras, DO, [facility] (established care 02/12/2020)  REASON FOR CONSULT: Abnormal EKG.  REQUESTING PHYSICIAN:  Jordan Hawks, [address] [place],  [place] [address]  Chief Complaint  Patient presents with  . New Patient (Initial Visit)  . Abnormal ECG    HPI  [name] is a 74 y.o. female who presents to the office with a chief complaint of " abnormal EKG." She is referred to the office at the request of Jordan Hawks, NP. Patient's past medical history and cardiovascular risk factors include: Hypertension,history of breast cancer status post left mastectomy, postmenopausal female, advanced age.  Patient was referred to the office at the request of her primary care provider for evaluation of abnormal EKG.  Patient is accompanied by her sister [name] who states that she has power of attorney on behalf of the patient (do not have the documentation to verify this).  Patient provides verbal consent to discussing her medical conditions in her presence.  It appears that the patient has been lost to follow-up since 2016.  Patient sister states that she has not seen a physician since 2016 to the best of their knowledge.  They went in to reestablish care with a primary care physician an EKG was interpreted to be abnormal and she presents to the office for further evaluation.  She denies any chest pain or shortness of breath at rest or with effort related activities.  Her overall functional status is limited due to decreased motivation, and right knee pain.  No known history of coronary artery disease in the past.  Patient plans to have orthopedic and eye surgery in the near future but wanted a cardiovascular evaluation initially.  Patient is a former smoker.  And drinks liquor more often than she should according to the patient's sister.  Patient is not  willing to provide more details in regards to the frequency of the amount of alcohol consumption.   Patient has stopped taking her medications because he ran out of refills.  Patient did not bring a list of her medications or bottles with her at today's office visit to verify.  We reached out to her pharmacy to help reconcile medications but had to leave a voicemail.  Denies prior history of coronary artery disease, myocardial infarction, congestive heart failure, deep venous thrombosis, pulmonary embolism, stroke, transient ischemic attack.  FUNCTIONAL STATUS: No structured exercise program or daily routine due to right knee pain.    ALLERGIES: Allergies  Allergen Reactions  . Ace Inhibitors Other (See Comments)    Acute renal failure  . Angiotensin Receptor Blockers Other (See Comments)    Acute Renal Failure    MEDICATION LIST PRIOR TO VISIT: No outpatient medications have been marked as taking for the 02/12/20 encounter (Office Visit) with Terri Skains, Keiley Levey, DO.  Unavailable to verify outpatient medications.  She did not bring in a list of the current medications for medication bottles.  Also reached out to her pharmacy at today's office visit but had to leave a voicemail.  Patient is asked to call the office when she reaches home to verify current medication list.  PAST MEDICAL HISTORY: Past Medical History:  Diagnosis Date  . Allergy   . Anxiety   . 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 Asthma   . Breast cancer ([place])  S/P left mastectomy 2015  . Depression   . Hypertension   . Hypertriglyceridemia   . Migraine 1980's   PAST SURGICAL HISTORY: Patient is not able to accurately helpless reconcile her past surgical history.  And her sister who recently started helping take care of her does not know all the details either. Past Surgical History:  Procedure Laterality Date  . ABDOMINAL HYSTERECTOMY  1981   "fibroids"  . ANKLE FRACTURE SURGERY Left 03/15/2000   with hardware  .  APPLICATION OF WOUND VAC  01/17/2014   w/chest wall debridement  . APPLICATION OF WOUND VAC Left 01/17/2014   Procedure: PLACEMENT OF VAC DRESSING;  Surgeon: Merrie Roof, MD;  Location: [facility];  Service: General;  Laterality: Left;  . BREAST SURGERY Left    removed due to BCA  . COLONOSCOPY    . JOINT REPLACEMENT    . MASTECTOMY COMPLETE / SIMPLE W/ SENTINEL NODE BIOPSY Left   . MASTECTOMY W/ SENTINEL NODE BIOPSY Left 12/20/2013   Procedure: MASTECTOMY WITH SENTINEL LYMPH NODE BIOPSY;  Surgeon: Merrie Roof, MD;  Location: [facility];  Service: General;  Laterality: Left;  . ORIF SHOULDER FRACTURE Left 1964  . ROTATOR CUFF REPAIR Right   . TOTAL HIP ARTHROPLASTY Right   . TOTAL HIP ARTHROPLASTY Left   . TUBAL LIGATION  1973  . WOUND DEBRIDEMENT Left 01/17/2014   Procedure: DEBRIDEMENT OF CHEST WALL;  Surgeon: Merrie Roof, MD;  Location: [facility];  Service: General;  Laterality: Left;    FAMILY HISTORY: The patient family history includes Alcohol abuse in her father; Asthma in her father and mother; Stroke in her mother and sister.  SOCIAL HISTORY:  The patient  reports that she quit smoking about 19 years ago. Her smoking use included cigarettes. She has a 20.00 pack-year smoking history. She has never used smokeless tobacco. She reports current alcohol use. She reports current drug use. Drug: Marijuana.  REVIEW OF SYSTEMS: Review of Systems  Constitutional: Negative for chills and fever.  HENT: Negative for hoarse voice and nosebleeds.   Eyes: Negative for discharge, double vision and pain.  Cardiovascular: Positive for leg swelling. Negative for chest pain, claudication, dyspnea on exertion, near-syncope, orthopnea, palpitations, paroxysmal nocturnal dyspnea and syncope.  Respiratory: Negative for hemoptysis and shortness of breath.   Musculoskeletal: Positive for arthritis and joint pain. Negative for muscle cramps and myalgias.  Gastrointestinal: Negative for abdominal pain,  constipation, diarrhea, hematemesis, hematochezia, melena, nausea and vomiting.  Neurological: Negative for dizziness and light-headedness.   PHYSICAL EXAM: Vitals with BMI 02/12/2020 12/14/2017 06/09/2017  Height 5' 4"  5' 3"  5' 3"   Weight 158 lbs 163 lbs 171 lbs 10 oz  BMI 27.11 42.70 62.37  Systolic 628 92 315  Diastolic 64 60 66  Pulse 83 98 111   CONSTITUTIONAL: Appears older than stated age, currently presents in a wheelchair, walks with a cane at home at times.  SKIN: Skin is warm and dry. No rash noted. No cyanosis. No pallor. No jaundice HEAD: Normocephalic and atraumatic.  EYES: No scleral icterus MOUTH/THROAT: Dry oral membranes.  NECK: No JVD present. No thyromegaly noted. LYMPHATIC: No visible cervical adenopathy.  CHEST Normal respiratory effort. No intercostal retractions  LUNGS: Clear to auscultation bilaterally.  No stridor. No wheezes. No rales.  CARDIOVASCULAR: Regular, positive V7-O1, soft holosystolic murmur heard at the apex, no gallops or rubs. ABDOMINAL: Obese, soft, nontender, nondistended, positive bowel sounds in all 4 quadrants, no apparent ascites.  EXTREMITIES: Asymmetrical lower extremities right more swollen than the left.  Trace bilateral pitting edema HEMATOLOGIC: No significant bruising NEUROLOGIC: Oriented to person, place, and time. Nonfocal. Normal muscle tone.  PSYCHIATRIC: Normal mood and affect. Normal behavior. Cooperative  CARDIAC DATABASE: EKG: 02/12/2020:Sinus  Rhythm, 86bpm, left axis, PRWP, IRBBB, LAFB, ST-T changes in high lateral leads either due to IRBBB but ischemia cannot be ruled out. No prior EKG for compairison.   Echocardiogram: None  Stress Testing: None  Heart Catheterization: None  LABORATORY DATA:  External Labs: Collected: 01/01/2020 Creatinine 0.74 mg/dL. eGFR: 92 mL/min per 1.73 m Hemoglobin 13.6 g/dL Lipid profile: Total cholesterol 145, triglycerides 193, HDL 55, LDL 64, non-HDL 90 Hemoglobin A1c: 5.6 TSH:  2.83 and free T4 1 and Free T3 3.2  IMPRESSION:    ICD-10-CM   1. Nonspecific abnormal electrocardiogram (ECG) (EKG)  R94.31 EKG 12-Lead    PCV ECHOCARDIOGRAM COMPLETE    PCV MYOCARDIAL PERFUSION WITH LEXISCAN  2. Essential hypertension  I10   3. Former smoker  Z87.891   4. Leg swelling  M79.89 VAS [place] LOWER EXTREMITY VENOUS (DVT)  5. Murmur, cardiac  R01.1      RECOMMENDATIONS: Lakena Sparlin Argote is a 74 y.o. female whose past medical history and cardiac risk factors include: Hypertension,history of breast cancer status post left mastectomy, postmenopausal female, advanced age.  Abnormal EKG: Patient underlying rhythm is sinus however does have ST-T changes in the high lateral leads suggestive of possible underlying ischemia.  Unable to evaluate overall functional status as she walks with a cane and has arthritis of the knee.  Patient has been lost to follow-up since 2016.  She also has risk factors such as underlying hypertension, excessive alcohol consumption, former smoker, postmenopausal female and advanced age.  Given the risk factors and findings would recommend an ischemic evaluation.  Echocardiogram will be ordered to evaluate for structural heart disease and left ventricular systolic function.  Nuclear stress test recommended to evaluate for reversible ischemia.  Cardiac murmur: Echocardiogram will be ordered to evaluate for structural heart disease and left ventricular systolic function.  Lower extremity swelling: Patient appears to have mild asymmetric of bilateral lower extremity.  This may be secondary to osteoarthritis of the knee and prior surgery.  However, since the patient is not very active would recommend lower extremity duplex to rule out deep venous thrombosis and evaluate for chronic venous insufficiency.  Spent significant time discussing the importance of decreasing alcohol consumption.  Patient is not willing to discuss the amount and frequency of her alcohol  consumption.  I have asked her to discuss this further with her PCP.  She does understand that excessive alcohol consumption does contribute to multiple chronic comorbid conditions including cardiovascular disease.  Patient is asked to bring her medication bottles in at the next office visit for more accurate medication reconciliation.  We also called her pharmacy and left a voicemail to call us back to see what medication she is taking.  She is also asked to call us back once she reaches home to see what medications she is taking.  We'll defer the management of her other chronic comorbid conditions to her PCP.  FINAL MEDICATION LIST END OF ENCOUNTER: No orders of the defined types were placed in this encounter.    Current Outpatient Medications:  .  anastrozole (ARIMIDEX) 1 MG tablet, Take 1 tablet (1 mg total) by mouth daily. (Patient not taking: Reported on 02/12/2020), Disp: 30 tablet, Rfl: 3  Orders Placed This Encounter  Procedures  . PCV MYOCARDIAL PERFUSION WITH LEXISCAN  . EKG 12-Lead  . PCV ECHOCARDIOGRAM COMPLETE  . VAS [place] LOWER EXTREMITY VENOUS (DVT)    There are no Patient Instructions on file for this visit.   --Continue cardiac medications as reconciled in final medication list. --Return in about 5 weeks (around 03/18/2020) for review test results.. Or sooner if needed. --Continue follow-up with your primary care physician regarding the management of your other chronic comorbid conditions.  Patient's questions and concerns were addressed to her satisfaction. She voices understanding of the instructions provided during this encounter.   This note was created using a voice recognition software as a result there may be grammatical errors inadvertently enclosed that do not reflect the nature of this encounter. Every attempt is made to correct such errors.  Rex Kras, [place], [facility]  Pager: [phone] Office: [phone]

## 2020-02-27 ENCOUNTER — Ambulatory Visit: Payer: Medicare Other

## 2020-02-27 ENCOUNTER — Other Ambulatory Visit: Payer: Self-pay

## 2020-02-27 DIAGNOSIS — R9431 Abnormal electrocardiogram [ECG] [EKG]: Secondary | ICD-10-CM

## 2020-02-27 DIAGNOSIS — M7989 Other specified soft tissue disorders: Secondary | ICD-10-CM

## 2020-03-02 ENCOUNTER — Telehealth: Payer: Self-pay | Admitting: Cardiology

## 2020-03-02 NOTE — Telephone Encounter (Signed)
Lower Extremity Venous Duplex  02/27/2020: No evidence of deep vein thrombosis of the lower extremities with normal venous return. Test will be uploaded soon   Adrian Prows, MD, [facility] 03/02/2020, 6:03 PM Office: [phone]

## 2020-03-04 DIAGNOSIS — M25561 Pain in right knee: Secondary | ICD-10-CM | POA: Diagnosis not present

## 2020-03-11 NOTE — Progress Notes (Signed)
1 of 3 messages:  Spoke with patient Sister Mariane Baumgarten, she will pick up her Aspirin 81mg  today, and she understands if patient complains of CP or SOB, she is to go to ER. She also stated that she will be here with her at next appt.

## 2020-03-11 NOTE — Progress Notes (Signed)
3 of 3 messages :  Spoke with patient [name] regarding patient results. She also stated that she will be here with her at next appt.

## 2020-03-11 NOTE — Progress Notes (Signed)
2 of 3 messages : Spoke with patient Sister [name], no sooner can be made and that date always works for patient sister the best. She also stated that she will be here with her at next appt.

## 2020-03-19 ENCOUNTER — Other Ambulatory Visit: Payer: Self-pay

## 2020-03-19 ENCOUNTER — Ambulatory Visit: Payer: Medicare Other | Admitting: Cardiology

## 2020-03-19 ENCOUNTER — Encounter: Payer: Self-pay | Admitting: Cardiology

## 2020-03-19 VITALS — BP 134/76 | HR 99 | Resp 16 | Ht 64.0 in | Wt 144.0 lb

## 2020-03-19 DIAGNOSIS — Z712 Person consulting for explanation of examination or test findings: Secondary | ICD-10-CM

## 2020-03-19 DIAGNOSIS — R9439 Abnormal result of other cardiovascular function study: Secondary | ICD-10-CM

## 2020-03-19 DIAGNOSIS — Z87891 Personal history of nicotine dependence: Secondary | ICD-10-CM

## 2020-03-19 DIAGNOSIS — I10 Essential (primary) hypertension: Secondary | ICD-10-CM

## 2020-03-19 MED ORDER — NITROGLYCERIN 0.4 MG SL SUBL: 0.4000 mg | SUBLINGUAL_TABLET | SUBLINGUAL | 0 refills | Status: DC | PRN

## 2020-03-19 MED ORDER — ATORVASTATIN CALCIUM 20 MG PO TABS: 20.0000 mg | ORAL_TABLET | Freq: Every day | ORAL | 0 refills | Status: DC

## 2020-03-19 MED ORDER — METOPROLOL SUCCINATE ER 25 MG PO TB24: 25.0000 mg | ORAL_TABLET | Freq: Every morning | ORAL | 0 refills | Status: DC

## 2020-03-19 NOTE — Progress Notes (Signed)
ID:  [place], DOB [birth year], MRN [id number]  PCP:  Jordan Hawks, NP  Cardiologist:  Rex Kras, DO, [facility] (established care 02/12/2020)  Date: 03/19/2020 Last Office Visit: 02/12/2020  Chief Complaint  Patient presents with  . Follow-up  . Results    HPI  [name] is a 74 y.o. female who presents to the office with a chief complaint of " follow-up on test results." Patient's past medical history and cardiovascular risk factors include: Hypertension,history of breast cancer status post left mastectomy, postmenopausal female, advanced age.  Patient was referred to the office at the request of her primary care provider for evaluation of abnormal EKG.  Patient is accompanied by her sister [name] who states that she has power of attorney on behalf of the patient (do not have the documentation to verify this).  Patient provides verbal consent to discussing her medical conditions in her presence.  She was referred to the office in August 2021 for an abnormal EKG.  Given her multiple cardiovascular risk factors, decreased functional status, and EKG findings she was recommended to undergo an echocardiogram and stress test.  Since last office visit patient's echocardiogram noted an LVEF of 56%, without any significant valvular heart disease.  However, nuclear stress test does report reversible ischemia.  The findings along with images were reviewed with the patient and her sister at today's office visit.    Patient is a former smoker.  And drinks liquor more often than she should according to the patient's sister.  Patient is not willing to provide more details in regards to the frequency of the amount of alcohol consumption.   Denies prior history of coronary artery disease, myocardial infarction, congestive heart failure, deep venous thrombosis, pulmonary embolism, stroke, transient ischemic attack.  FUNCTIONAL STATUS: No structured exercise program or daily routine due to right  knee pain.    ALLERGIES: Allergies  Allergen Reactions  . Ace Inhibitors Other (See Comments)    Acute renal failure  . Angiotensin Receptor Blockers Other (See Comments)    Acute Renal Failure    MEDICATION LIST PRIOR TO VISIT: Current Meds  Medication Sig  . acetaminophen-codeine (TYLENOL #3) 300-30 MG tablet Take by mouth.  Marland Kitchen amLODipine-olmesartan (AZOR) 5-20 MG tablet Take 1 tablet by mouth at bedtime.  Marland Kitchen aspirin EC 81 MG tablet Take 81 mg by mouth daily. Swallow whole.  Unavailable to verify outpatient medications.  She did not bring in a list of the current medications for medication bottles.  Also reached out to her pharmacy at today's office visit but had to leave a voicemail.  Patient is asked to call the office when she reaches home to verify current medication list.  PAST MEDICAL HISTORY: Past Medical History:  Diagnosis Date  . Allergy   . Anxiety   . 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 Asthma   . Breast cancer ([place])    S/P left mastectomy 2015  . Depression   . Hypertension   . Hypertriglyceridemia   . Migraine 1980's   PAST SURGICAL HISTORY: Patient is not able to accurately helpless reconcile her past surgical history.  And her sister who recently started helping take care of her does not know all the details either. Past Surgical History:  Procedure Laterality Date  . ABDOMINAL HYSTERECTOMY  1981   "fibroids"  . ANKLE FRACTURE SURGERY Left 03/15/2000   with hardware  . APPLICATION OF WOUND VAC  01/17/2014   w/chest wall debridement  .  APPLICATION OF WOUND VAC Left 01/17/2014   Procedure: PLACEMENT OF VAC DRESSING;  Surgeon: Merrie Roof, MD;  Location: [place];  Service: General;  Laterality: Left;  . BREAST SURGERY Left    removed due to BCA  . COLONOSCOPY    . JOINT REPLACEMENT    . MASTECTOMY COMPLETE / SIMPLE W/ SENTINEL NODE BIOPSY Left   . MASTECTOMY W/ SENTINEL NODE BIOPSY Left 12/20/2013   Procedure: MASTECTOMY WITH SENTINEL LYMPH NODE BIOPSY;   Surgeon: Merrie Roof, MD;  Location: [place];  Service: General;  Laterality: Left;  . ORIF SHOULDER FRACTURE Left 1964  . ROTATOR CUFF REPAIR Right   . TOTAL HIP ARTHROPLASTY Right   . TOTAL HIP ARTHROPLASTY Left   . TUBAL LIGATION  1973  . WOUND DEBRIDEMENT Left 01/17/2014   Procedure: DEBRIDEMENT OF CHEST WALL;  Surgeon: Merrie Roof, MD;  Location: [place];  Service: General;  Laterality: Left;    FAMILY HISTORY: The patient family history includes Alcohol abuse in her father; Asthma in her father and mother; Stroke in her mother and sister.  SOCIAL HISTORY:  The patient  reports that she quit smoking about 19 years ago. Her smoking use included cigarettes. She has a 20.00 pack-year smoking history. She has never used smokeless tobacco. She reports current alcohol use. She reports current drug use. Drug: Marijuana.  REVIEW OF SYSTEMS: Review of Systems  Constitutional: Negative for chills and fever.  HENT: Negative for hoarse voice and nosebleeds.   Eyes: Negative for discharge, double vision and pain.  Cardiovascular: Positive for leg swelling. Negative for chest pain, claudication, dyspnea on exertion, near-syncope, orthopnea, palpitations, paroxysmal nocturnal dyspnea and syncope.  Respiratory: Negative for hemoptysis and shortness of breath.   Musculoskeletal: Positive for arthritis and joint pain. Negative for muscle cramps and myalgias.  Gastrointestinal: Negative for abdominal pain, constipation, diarrhea, hematemesis, hematochezia, melena, nausea and vomiting.  Neurological: Negative for dizziness and light-headedness.   PHYSICAL EXAM: Vitals with BMI 03/19/2020 02/12/2020 12/14/2017  Height _0  _1  _2   Weight 144 lbs 158 lbs 163 lbs  BMI 24.71 97.35 32.99  Systolic 242 683 92  Diastolic 76 64 60  Pulse 99 83 98   CONSTITUTIONAL: Appears older than stated age, currently presents in a wheelchair, walks with a cane at home at times.  SKIN: Skin is warm and dry. No  rash noted. No cyanosis. No pallor. No jaundice HEAD: Normocephalic and atraumatic.  EYES: No scleral icterus MOUTH/THROAT: Dry oral membranes.  NECK: No JVD present. No thyromegaly noted. LYMPHATIC: No visible cervical adenopathy.  CHEST Normal respiratory effort. No intercostal retractions  LUNGS: Clear to auscultation bilaterally.  No stridor. No wheezes. No rales.  CARDIOVASCULAR: Regular, positive M1-D6, soft holosystolic murmur heard at the apex, no gallops or rubs. ABDOMINAL: Obese, soft, nontender, nondistended, positive bowel sounds in all 4 quadrants, no apparent ascites.  EXTREMITIES: Asymmetrical lower extremities right more swollen than the left.  Trace bilateral pitting edema HEMATOLOGIC: No significant bruising NEUROLOGIC: Oriented to person, place, and time. Nonfocal. Normal muscle tone.  PSYCHIATRIC: Normal mood and affect. Normal behavior. Cooperative  CARDIAC DATABASE: EKG: 02/12/2020:Sinus  Rhythm, 86bpm, left axis, PRWP, IRBBB, LAFB, ST-T changes in high lateral leads either due to IRBBB but ischemia cannot be ruled out. No prior EKG for compairison.   Echocardiogram: 02/27/2020:  Left ventricle cavity is normal in size and wall thickness. Normal global wall motion. Normal LV systolic function with EF 56%. Normal  diastolic filling pattern.  No significant valvular abnormality.  Normal right atrial pressure.   Stress Testing: Lexiscan Tetrofosmin stress test 02/27/2020:  SPECT images show medium sized area of mild decreased intensity tracer uptake in inferior/inferoseptal myocardium, without reversibility. In absence of regional wall thickening abnormality, this likely represents tissue attenuation. In addition, there is a moderate sized, mild to moderate intensity, reversible perfusion defect in basal to mid anterolateral and apical lateral myocardium suggestive of ischemia.   Heart Catheterization: None  Lower Extremity Venous Duplex08/25/2021:  No evidence of  deep vein thrombosis of the lower extremities with normal venous return.   LABORATORY DATA:  External Labs: Collected: 01/01/2020 Creatinine 0.74 mg/dL. eGFR: 92 mL/min per 1.73 m Hemoglobin 13.6 g/dL Lipid profile: Total cholesterol 145, triglycerides 193, HDL 55, LDL 64, non-HDL 90 Hemoglobin A1c: 5.6 TSH: 2.83 and free T4 1 and Free T3 3.2  IMPRESSION:    ICD-10-CM   1. Abnormal nuclear stress test  R94.39 metoprolol succinate (TOPROL XL) 25 MG 24 hr tablet    atorvastatin (LIPITOR) 20 MG tablet    nitroGLYCERIN (NITROSTAT) 0.4 MG SL tablet  2. Essential hypertension  I10   3. Former smoker  Z87.891   4. Encounter to discuss test results  Z71.2      RECOMMENDATIONS: Silveria Botz Rita is a 74 y.o. female whose past medical history and cardiac risk factors include: Hypertension,history of breast cancer status post left mastectomy, postmenopausal female, advanced age.  Abnormal nuclear stress test:  Patient had an abnormal EKG.  Given her multiple cardiovascular risk factors recommended to undergo ischemic evaluation.  She underwent an echocardiogram and stress test results which are noted above for further reference.  I independently reviewed the images with the patient and her sister today's office visit.  She does have a fixed inferior wall defect most likely secondary to motion artifact.  However she does have a medium sized, moderate intensity, reversible perfusion involving the basal anterolateral, basal inferolateral, mid inferolateral suggestive of possible reversible ischemia in the LCx distribution.  Recommended coronary CTA versus invasive angiography for further evaluation.  However, patient does not want any additional testing at this time.  Start Toprol-XL 25 mg p.o. daily.  Start atorvastatin 20 mg p.o. nightly.  Sublingual nitroglycerin tablets to use on as needed basis.  Medication profile discussed with the patient and her sister.  They both are instructed to  seek medical attention by going to the closest ER via EMS if  she has symptoms of typical chest pain or atypical symptoms which increase in intensity, frequency, and/or duration.  Lower extremity swelling: Lower extremity duplex negative for DVT.  Results noted above.  Former smoker: Educated on the importance of continued smoking cessation.  Reeducated on the importance of complete alcohol cessation.  We'll defer the management of her other chronic comorbid conditions to her PCP.  FINAL MEDICATION LIST END OF ENCOUNTER: Meds ordered this encounter  Medications  . metoprolol succinate (TOPROL XL) 25 MG 24 hr tablet    Sig: Take 1 tablet (25 mg total) by mouth in the morning.    Dispense:  30 tablet    Refill:  0  . atorvastatin (LIPITOR) 20 MG tablet    Sig: Take 1 tablet (20 mg total) by mouth at bedtime.    Dispense:  90 tablet    Refill:  0  . nitroGLYCERIN (NITROSTAT) 0.4 MG SL tablet    Sig: Place 1 tablet (0.4 mg total) under the tongue every 5 (five) minutes  as needed for chest pain. If you require more than two tablets five minutes apart go to the nearest ER via EMS.    Dispense:  30 tablet    Refill:  0     Current Outpatient Medications:  .  acetaminophen-codeine (TYLENOL #3) 300-30 MG tablet, Take by mouth., Disp: , Rfl:  .  amLODipine-olmesartan (AZOR) 5-20 MG tablet, Take 1 tablet by mouth at bedtime., Disp: , Rfl:  .  aspirin EC 81 MG tablet, Take 81 mg by mouth daily. Swallow whole., Disp: , Rfl:  .  atorvastatin (LIPITOR) 20 MG tablet, Take 1 tablet (20 mg total) by mouth at bedtime., Disp: 90 tablet, Rfl: 0 .  metoprolol succinate (TOPROL XL) 25 MG 24 hr tablet, Take 1 tablet (25 mg total) by mouth in the morning., Disp: 30 tablet, Rfl: 0 .  nitroGLYCERIN (NITROSTAT) 0.4 MG SL tablet, Place 1 tablet (0.4 mg total) under the tongue every 5 (five) minutes as needed for chest pain. If you require more than two tablets five minutes apart go to the nearest ER via EMS.,  Disp: 30 tablet, Rfl: 0  No orders of the defined types were placed in this encounter.   There are no Patient Instructions on file for this visit.   --Continue cardiac medications as reconciled in final medication list. --Return in about 4 weeks (around 04/16/2020) for Abnormal stress test; patient considering heart cath. . Or sooner if needed. --Continue follow-up with your primary care physician regarding the management of your other chronic comorbid conditions.  Patient's questions and concerns were addressed to her satisfaction. She voices understanding of the instructions provided during this encounter.   This note was created using a voice recognition software as a result there may be grammatical errors inadvertently enclosed that do not reflect the nature of this encounter. Every attempt is made to correct such errors.  Total time spent: 33 minutes.  Rex Kras, [place], [facility]  Pager: [phone] Office: [phone]

## 2020-04-16 ENCOUNTER — Ambulatory Visit: Payer: Medicare Other | Admitting: Cardiology

## 2020-04-16 ENCOUNTER — Encounter: Payer: Self-pay | Admitting: Cardiology

## 2020-04-16 ENCOUNTER — Other Ambulatory Visit: Payer: Self-pay

## 2020-04-16 VITALS — BP 103/61 | HR 79 | Resp 16 | Ht 64.0 in | Wt 151.6 lb

## 2020-04-16 DIAGNOSIS — Z87891 Personal history of nicotine dependence: Secondary | ICD-10-CM

## 2020-04-16 DIAGNOSIS — Z712 Person consulting for explanation of examination or test findings: Secondary | ICD-10-CM

## 2020-04-16 DIAGNOSIS — I10 Essential (primary) hypertension: Secondary | ICD-10-CM

## 2020-04-16 DIAGNOSIS — R9439 Abnormal result of other cardiovascular function study: Secondary | ICD-10-CM

## 2020-04-16 NOTE — Progress Notes (Signed)
ID:  [place], DOB 1946/02/16, MRN [id number]  PCP:  Jordan Hawks, NP  Cardiologist:  Rex Kras, DO, [facility] (established care 02/12/2020)  Date: 04/16/20 Last Office Visit: 03/19/2020  Chief Complaint  Patient presents with  . Abnormal Stress test  . Follow-up    4 week    HPI  [name] is a 74 y.o. female who presents to the office with a chief complaint of " follow-up on abnormal stress test." Patient's past medical history and cardiovascular risk factors include: Hypertension,history of breast cancer status post left mastectomy, postmenopausal female, advanced age.  Patient was referred to the office at the request of her primary care provider for evaluation of abnormal EKG.  Patient is accompanied by her sister [name] who states that she has power of attorney on behalf of the patient (do not have the documentation to verify this).  Patient provides verbal consent to discussing her medical conditions in her presence.  She was referred to the office in August 2021 for an abnormal EKG.  Given her multiple cardiovascular risk factors, decreased functional status, and EKG findings she was recommended to undergo an echocardiogram and stress test.  At the last office visit we reviewed the results of the echocardiogram which noted preserved LVEF with normal diastolic filling pattern and no significant valvular heart disease.  However, his nuclear stress test reported small degree of mild intensity reversible perfusion defect noted at the basal anterolateral myocardium.  We discussed undergoing coronary CTA versus invasive angiography to further evaluate for the possibility of obstructive CAD.  At last visit patient was unsure and wanted to consider medical management.  She was started on beta-blockers, statin therapy, and nitro tablets to use on as needed basis.  She now presents for follow-up and states that she is relatively stable.  She has started beta-blocker therapy and  statin therapy without any side effects or intolerances.  She has not required sublingual nitroglycerin tablets.   When asked if she is considered additional testing with either coronary CTA or invasive angiography patient was disrespectful and unprofessional in the presence of her sister.  Shared decision was to treat her medically and she is currently asymptomatic.  She denies the need of any medication refills.  And she is recommended to establish care with another cardiologist for longitudinal care in the next 30 to 60 days.  Until then she is welcome to call the office if she has any worsening symptoms, needs medication refills, or needs to be reevaluated.  As recommended by phone if she has any worsening symptoms that increase in intensity, frequency, and/or duration she needs to seek medical attention by going to the closest ER via EMS.  Both the patient and her sister verbalized understanding.  Patient is a former smoker.  And drinks liquor more often than she should according to the patient's sister.  Patient is not willing to provide more details in regards to the frequency of the amount of alcohol consumption.   FUNCTIONAL STATUS: No structured exercise program or daily routine due to right knee pain.    ALLERGIES: Allergies  Allergen Reactions  . Ace Inhibitors Other (See Comments)    Acute renal failure  . Angiotensin Receptor Blockers Other (See Comments)    Acute Renal Failure    MEDICATION LIST PRIOR TO VISIT: Current Meds  Medication Sig  . acetaminophen-codeine (TYLENOL #3) 300-30 MG tablet Take by mouth.  Marland Kitchen amLODipine-olmesartan (AZOR) 5-20 MG tablet Take 1 tablet by mouth at  bedtime.  Marland Kitchen aspirin EC 81 MG tablet Take 81 mg by mouth daily. Swallow whole.  Marland Kitchen atorvastatin (LIPITOR) 20 MG tablet Take 1 tablet (20 mg total) by mouth at bedtime.  . metoprolol succinate (TOPROL XL) 25 MG 24 hr tablet Take 1 tablet (25 mg total) by mouth in the morning.  . nitroGLYCERIN  (NITROSTAT) 0.4 MG SL tablet Place 1 tablet (0.4 mg total) under the tongue every 5 (five) minutes as needed for chest pain. If you require more than two tablets five minutes apart go to the nearest ER via EMS.    PAST MEDICAL HISTORY: Past Medical History:  Diagnosis Date  . Allergy   . Anxiety   . 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 Asthma   . Breast cancer ([place])    S/P left mastectomy 2015  . Depression   . Hypertension   . Hypertriglyceridemia   . Migraine 1980's   PAST SURGICAL HISTORY: Patient is not able to accurately help reconcile her past surgical history.  And her sister who recently started helping take care of her does not know all the details either. Past Surgical History:  Procedure Laterality Date  . ABDOMINAL HYSTERECTOMY  1981   "fibroids"  . ANKLE FRACTURE SURGERY Left 03/15/2000   with hardware  . APPLICATION OF WOUND VAC  01/17/2014   w/chest wall debridement  . APPLICATION OF WOUND VAC Left 01/17/2014   Procedure: PLACEMENT OF VAC DRESSING;  Surgeon: Merrie Roof, MD;  Location: [place];  Service: General;  Laterality: Left;  . BREAST SURGERY Left    removed due to BCA  . COLONOSCOPY    . JOINT REPLACEMENT    . MASTECTOMY COMPLETE / SIMPLE W/ SENTINEL NODE BIOPSY Left   . MASTECTOMY W/ SENTINEL NODE BIOPSY Left 12/20/2013   Procedure: MASTECTOMY WITH SENTINEL LYMPH NODE BIOPSY;  Surgeon: Merrie Roof, MD;  Location: [place];  Service: General;  Laterality: Left;  . ORIF SHOULDER FRACTURE Left 1964  . ROTATOR CUFF REPAIR Right   . TOTAL HIP ARTHROPLASTY Right   . TOTAL HIP ARTHROPLASTY Left   . TUBAL LIGATION  1973  . WOUND DEBRIDEMENT Left 01/17/2014   Procedure: DEBRIDEMENT OF CHEST WALL;  Surgeon: Merrie Roof, MD;  Location: [place];  Service: General;  Laterality: Left;    FAMILY HISTORY: The patient family history includes Alcohol abuse in her father; Asthma in her father and mother; Stroke in her mother and sister.  SOCIAL HISTORY:  The patient   reports that she quit smoking about 19 years ago. Her smoking use included cigarettes. She has a 20.00 pack-year smoking history. She has never used smokeless tobacco. She reports current alcohol use. She reports current drug use. Drug: Marijuana.  REVIEW OF SYSTEMS: Review of Systems  Constitutional: Negative for chills and fever.  HENT: Negative for hoarse voice and nosebleeds.   Eyes: Negative for discharge, double vision and pain.  Cardiovascular: Positive for leg swelling. Negative for chest pain, claudication, dyspnea on exertion, near-syncope, orthopnea, palpitations, paroxysmal nocturnal dyspnea and syncope.  Respiratory: Negative for hemoptysis and shortness of breath.   Musculoskeletal: Positive for arthritis and joint pain. Negative for muscle cramps and myalgias.  Gastrointestinal: Negative for abdominal pain, constipation, diarrhea, hematemesis, hematochezia, melena, nausea and vomiting.  Neurological: Negative for dizziness and light-headedness.   PHYSICAL EXAM: Vitals with BMI 04/16/2020 03/19/2020 02/12/2020  Height 5' 4"  5' 4"  5' 4"   Weight 151 lbs 10 oz  144 lbs 158 lbs  BMI 26.01 50.27 74.12  Systolic 878 676 720  Diastolic 61 76 64  Pulse 79 99 83   CONSTITUTIONAL: Appears older than stated age, currently presents in a wheelchair, walks with a cane at home at times.  SKIN: Skin is warm and dry. No rash noted. No cyanosis. No pallor. No jaundice HEAD: Normocephalic and atraumatic.  EYES: No scleral icterus MOUTH/THROAT: Dry oral membranes.  NECK: No JVD present. No thyromegaly noted. LYMPHATIC: No visible cervical adenopathy.  CHEST Normal respiratory effort. No intercostal retractions  LUNGS: Clear to auscultation bilaterally.  No stridor. No wheezes. No rales.  CARDIOVASCULAR: Regular, positive N4-B0, soft holosystolic murmur heard at the apex, no gallops or rubs. ABDOMINAL: Obese, soft, nontender, nondistended, positive bowel sounds in all 4 quadrants, no apparent  ascites.  EXTREMITIES: Asymmetrical lower extremities right more swollen than the left.  Trace bilateral pitting edema HEMATOLOGIC: No significant bruising NEUROLOGIC: Oriented to person, place, and time. Nonfocal. Normal muscle tone.  PSYCHIATRIC: Normal mood and affect. Normal behavior. Cooperative  CARDIAC DATABASE: EKG: 02/12/2020:Sinus  Rhythm, 86bpm, left axis, PRWP, IRBBB, LAFB, ST-T changes in high lateral leads either due to IRBBB but ischemia cannot be ruled out. No prior EKG for compairison.   Echocardiogram: 02/27/2020:  Left ventricle cavity is normal in size and wall thickness. Normal global wall motion. Normal LV systolic function with EF 56%. Normal diastolic filling pattern.  No significant valvular abnormality.  Normal right atrial pressure.   Stress Testing: Lexiscan Tetrofosmin stress test 02/27/2020:  SPECT images show medium sized area of mild decreased intensity tracer uptake in inferior/inferoseptal myocardium, without reversibility. In absence of regional wall thickening abnormality, this likely represents tissue attenuation. In addition, there is a moderate sized, mild to moderate intensity, reversible perfusion defect in basal to mid anterolateral and apical lateral myocardium suggestive of ischemia.   Heart Catheterization: None  Lower Extremity Venous Duplex08/25/2021:  No evidence of deep vein thrombosis of the lower extremities with normal venous return.   LABORATORY DATA: External Labs: Collected: 01/01/2020 Creatinine 0.74 mg/dL. eGFR: 92 mL/min per 1.73 m Hemoglobin 13.6 g/dL Lipid profile: Total cholesterol 145, triglycerides 193, HDL 55, LDL 64, non-HDL 90 Hemoglobin A1c: 5.6 TSH: 2.83 and free T4 1 and Free T3 3.2  IMPRESSION:  No diagnosis found.   RECOMMENDATIONS: Ivanka Kirshner Bevans is a 74 y.o. female whose past medical history and cardiac risk factors include: Hypertension,history of breast cancer status post left mastectomy,  postmenopausal female, advanced age.  Abnormal nuclear stress test:  Patient denies any chest pain or shortness of breath at rest or with effort related activities since last visit.  Reviewed the results of the echo and stress test with her at today's office visit.  When asked if she is considered additional testing with either coronary CTA or invasive angiography patient was disrespectful and unprofessional in the presence of her sister.  Shared decision was to treat her medically and she is currently asymptomatic.  She denies the need of any medication refills.  And she is recommended to establish care with another cardiologist for longitudinal care in the next 30 to 60 days.  Until then she is welcome to call the office if she has any worsening symptoms, needs medication refills, or needs to be reevaluated.  As recommended by phone if she has any worsening symptoms that increase in intensity, frequency, and/or duration she needs to seek medical attention by going to the closest ER via EMS.  Both the patient  and her sister verbalized understanding.  Continue aspirin and statin therapy.  Continue beta-blockers.  Continue olmesartan.  She has sublingual nitroglycerin tablets to use on as needed basis.    Former smoker: Educated on the importance of continued smoking cessation.  Reeducated on the importance of complete alcohol cessation.  We'll defer the management of her other chronic comorbid conditions to her PCP.  As mentioned above patient is asked to establish care with another cardiologist for longitudinal care in the next 30 to 60 days.  During that time she is welcome to call the office if she has any worsening symptoms, or needs to be reevaluated.  Patient's sister who also accompanied her at today's visit was apologetic of the patient's behavior as it was disrespectful and unprofessional. It was explained to the patient that such behavior is not conducive for healthy patient doctor  relationship.  Patient's sister verbalized understanding and was thankful.   FINAL MEDICATION LIST END OF ENCOUNTER: No orders of the defined types were placed in this encounter.    Current Outpatient Medications:  .  acetaminophen-codeine (TYLENOL #3) 300-30 MG tablet, Take by mouth., Disp: , Rfl:  .  amLODipine-olmesartan (AZOR) 5-20 MG tablet, Take 1 tablet by mouth at bedtime., Disp: , Rfl:  .  aspirin EC 81 MG tablet, Take 81 mg by mouth daily. Swallow whole., Disp: , Rfl:  .  atorvastatin (LIPITOR) 20 MG tablet, Take 1 tablet (20 mg total) by mouth at bedtime., Disp: 90 tablet, Rfl: 0 .  metoprolol succinate (TOPROL XL) 25 MG 24 hr tablet, Take 1 tablet (25 mg total) by mouth in the morning., Disp: 30 tablet, Rfl: 0 .  nitroGLYCERIN (NITROSTAT) 0.4 MG SL tablet, Place 1 tablet (0.4 mg total) under the tongue every 5 (five) minutes as needed for chest pain. If you require more than two tablets five minutes apart go to the nearest ER via EMS., Disp: 30 tablet, Rfl: 0  No orders of the defined types were placed in this encounter.  --Continue cardiac medications as reconciled in final medication list. --No follow-ups on file.  --Continue follow-up with your primary care physician regarding the management of your other chronic comorbid conditions.  Patient's questions and concerns were addressed to her satisfaction. She voices understanding of the instructions provided during this encounter.   This note was created using a voice recognition software as a result there may be grammatical errors inadvertently enclosed that do not reflect the nature of this encounter. Every attempt is made to correct such errors.  Total time spent: 33 minutes.  Rex Kras, [place], [facility]  Pager: [phone] Office: [phone]

## 2020-04-24 ENCOUNTER — Other Ambulatory Visit: Payer: Self-pay | Admitting: Cardiology

## 2020-04-24 DIAGNOSIS — R9439 Abnormal result of other cardiovascular function study: Secondary | ICD-10-CM

## 2020-07-08 DIAGNOSIS — I10 Essential (primary) hypertension: Secondary | ICD-10-CM | POA: Diagnosis not present

## 2020-07-08 DIAGNOSIS — R413 Other amnesia: Secondary | ICD-10-CM | POA: Diagnosis not present

## 2020-07-08 DIAGNOSIS — Z23 Encounter for immunization: Secondary | ICD-10-CM | POA: Diagnosis not present

## 2020-07-08 DIAGNOSIS — F1010 Alcohol abuse, uncomplicated: Secondary | ICD-10-CM | POA: Diagnosis not present

## 2020-07-08 DIAGNOSIS — Z7189 Other specified counseling: Secondary | ICD-10-CM | POA: Diagnosis not present

## 2020-07-08 DIAGNOSIS — M25561 Pain in right knee: Secondary | ICD-10-CM | POA: Diagnosis not present

## 2020-07-15 DIAGNOSIS — M25561 Pain in right knee: Secondary | ICD-10-CM | POA: Diagnosis not present

## 2020-07-29 DIAGNOSIS — M1711 Unilateral primary osteoarthritis, right knee: Secondary | ICD-10-CM | POA: Diagnosis not present

## 2020-07-30 DIAGNOSIS — M25561 Pain in right knee: Secondary | ICD-10-CM | POA: Diagnosis not present

## 2020-07-30 DIAGNOSIS — I10 Essential (primary) hypertension: Secondary | ICD-10-CM | POA: Diagnosis not present

## 2020-07-30 DIAGNOSIS — Z853 Personal history of malignant neoplasm of breast: Secondary | ICD-10-CM | POA: Diagnosis not present

## 2020-07-30 DIAGNOSIS — R413 Other amnesia: Secondary | ICD-10-CM | POA: Diagnosis not present

## 2020-08-21 ENCOUNTER — Telehealth: Payer: Self-pay

## 2020-08-21 NOTE — Telephone Encounter (Signed)
NOTES ON FILE FROM  DR Criss Rosales [phone], SENT REFERRAL TO SCHEDULING

## 2021-01-20 ENCOUNTER — Ambulatory Visit: Payer: Medicare Other | Admitting: Internal Medicine

## 2021-01-27 ENCOUNTER — Other Ambulatory Visit: Payer: Self-pay

## 2021-01-27 ENCOUNTER — Ambulatory Visit: Payer: Medicare Other | Admitting: Internal Medicine

## 2021-01-27 ENCOUNTER — Encounter: Payer: Self-pay | Admitting: Internal Medicine

## 2021-01-27 VITALS — BP 120/68 | HR 94 | Ht 64.0 in | Wt 155.2 lb

## 2021-01-27 DIAGNOSIS — R9439 Abnormal result of other cardiovascular function study: Secondary | ICD-10-CM | POA: Diagnosis not present

## 2021-01-27 DIAGNOSIS — I10 Essential (primary) hypertension: Secondary | ICD-10-CM

## 2021-01-27 DIAGNOSIS — R9431 Abnormal electrocardiogram [ECG] [EKG]: Secondary | ICD-10-CM

## 2021-01-27 DIAGNOSIS — J449 Chronic obstructive pulmonary disease, unspecified: Secondary | ICD-10-CM | POA: Diagnosis not present

## 2021-01-27 NOTE — Patient Instructions (Signed)
Medication Instructions:  Your physician has recommended you make the following change in your medication:  STOP: AZOR STOP: ATORVASTATIN  STOP: NITROGLYCERIN  *If you need a refill on your cardiac medications before your next appointment, please call your pharmacy*   Lab Work: NONE If you have labs (blood work) drawn today and your tests are completely normal, you will receive your results only by: [place] (if you have MyChart) OR A paper copy in the mail If you have any lab test that is abnormal or we need to change your treatment, we will call you to review the results.   Testing/Procedures: NONE    Follow-Up: At [facility], you and your health needs are our priority.  As part of our continuing mission to provide you with exceptional heart care, we have created designated Provider Care Teams.  These Care Teams include your primary Cardiologist (physician) and Advanced Practice Providers (APPs -  Physician Assistants and Nurse Practitioners) who all work together to provide you with the care you need, when you need it.  We recommend signing up for the patient portal called "MyChart".  Sign up information is provided on this After Visit Summary.  MyChart is used to connect with patients for Virtual Visits (Telemedicine).  Patients are able to view lab/test results, encounter notes, upcoming appointments, etc.  Non-urgent messages can be sent to your provider as well.   To learn more about what you can do with MyChart, go to [email or web address].    Your next appointment:   1 year(s)  The format for your next appointment:   In Person  Provider:   You may see Rudean Haskell, MD or one of the following Advanced Practice Providers on your designated Care Team:   Melina Copa, PA-C Ermalinda Barrios, PA-C

## 2021-01-27 NOTE — Progress Notes (Signed)
Cardiology Office Note:    Date:  01/27/2021   ID:  [name], DOB September 20, 1945, MRN [id number]  PCP:  Jordan Hawks, [place] Providers Cardiologist:  Werner Lean, MD     Referring MD: Jordan Hawks, FNP   CC:  Second opinion Medical mgmt of CAD  History of Present Illness:    [name] is a 75 y.o. female with a hx of COPD, HTN, HLD, who presents for evaluation 01/27/21.  Oncological History notable for: Malignancies: L sided Breast Cancer 2015; uterine cancer s/p hysterectomy (distant) Surgery ER+PR+ Her-2 -.   Chemotherapy: Arimidex Cessations for Toxicity: NA Radiation: None Oncology care spearheaded by: Dr. Alen Blew  Patient notes that she is feeling OK.  Has had no chest pain, chest pressure, chest tightness, chest stinging.  Patient exertion notable for doing ADLs such as cooking and feels no symptoms.  No shortness of breath, DOE .  No PND or orthopnea.  No bendopnea, weight gain, leg swelling , or abdominal swelling.  No syncope or near syncope. Notes no palpitations or funny heart beats.   No leg pain or claudication.  Patient has prior abnormal NM Stress test at [facility] and was planned for medical management.   Past Medical History:  Diagnosis Date   Allergy    Anxiety    Arthritis    "was in my hips"   Asthma    Breast cancer ([place])    S/P left mastectomy 2015   Depression    Hypertension    Hypertriglyceridemia    Migraine 1980's    Past Surgical History:  Procedure Laterality Date   ABDOMINAL HYSTERECTOMY  1981   "fibroids"   ANKLE FRACTURE SURGERY Left 03/15/2000   with hardware   APPLICATION OF WOUND VAC  01/17/2014   w/chest wall debridement   APPLICATION OF WOUND VAC Left 01/17/2014   Procedure: PLACEMENT OF VAC DRESSING;  Surgeon: Merrie Roof, MD;  Location: [facility];  Service: General;  Laterality: Left;   BREAST SURGERY Left    removed due to BCA   COLONOSCOPY     JOINT REPLACEMENT      MASTECTOMY COMPLETE / SIMPLE W/ SENTINEL NODE BIOPSY Left    MASTECTOMY W/ SENTINEL NODE BIOPSY Left 12/20/2013   Procedure: MASTECTOMY WITH SENTINEL LYMPH NODE BIOPSY;  Surgeon: Merrie Roof, MD;  Location: [facility];  Service: General;  Laterality: Left;   ORIF SHOULDER FRACTURE Left [place] Right    TOTAL HIP ARTHROPLASTY Right    TOTAL HIP ARTHROPLASTY Left    TUBAL LIGATION  1973   WOUND DEBRIDEMENT Left 01/17/2014   Procedure: DEBRIDEMENT OF CHEST WALL;  Surgeon: Merrie Roof, MD;  Location: [facility];  Service: General;  Laterality: Left;    Current Medications: Current Meds  Medication Sig   acetaminophen-codeine (TYLENOL #3) 300-30 MG tablet Take by mouth as needed.   aspirin EC 81 MG tablet Take 81 mg by mouth daily. Swallow whole.   metoprolol succinate (TOPROL-XL) 25 MG 24 hr tablet Take 1 tablet (25 mg total) by mouth in the morning.   [DISCONTINUED] amLODipine-olmesartan (AZOR) 5-20 MG tablet Take 1 tablet by mouth at bedtime.   [DISCONTINUED] atorvastatin (LIPITOR) 20 MG tablet Take 1 tablet (20 mg total) by mouth at bedtime.   [DISCONTINUED] nitroGLYCERIN (NITROSTAT) 0.4 MG SL tablet Place 1 tablet (0.4 mg total) under the tongue every 5 (five) minutes as needed for chest  pain. If you require more than two tablets five minutes apart go to the nearest ER via EMS.     Allergies:   Ace inhibitors and Angiotensin receptor blockers   Social History   Socioeconomic History   Marital status: Divorced    Spouse name: n/a   Number of children: 1   Years of education: 12   Highest education level: Not on file  Occupational History   Occupation: retired    Comment: Network engineer  Tobacco Use   Smoking status: Former    Packs/day: 0.50    Years: 40.00    Pack years: 20.00    Types: Cigarettes    Quit date: 11/11/2000    Years since quitting: 20.2   Smokeless tobacco: Never  Substance and Sexual Activity   Alcohol use: Yes    Comment:  occasionally   Drug use: Yes    Types: Marijuana    Comment: "last smoked marijuana in ~ 2010; recreational user only"   Sexual activity: Never  Other Topics Concern   Not on file  Social History Narrative   Her daughter and granddaughter live with her.   Social Determinants of Health   Financial Resource Strain: Not on file  Food Insecurity: Not on file  Transportation Needs: Not on file  Physical Activity: Not on file  Stress: Not on file  Social Connections: Not on file    Social:  Sister assists in her medical care  Family History: The patient's family history includes Alcohol abuse in her father; Asthma in her father and mother; Stroke in her mother and sister. There is no history of Colon cancer.  ROS:   Please see the history of present illness.     All other systems reviewed and are negative.  EKGs/Labs/Other Studies Reviewed:    The following studies were reviewed today:  EKG:  EKG is  ordered today.  The ekg ordered today demonstrates  SR: rate 96 Borderline Ant inf  Transthoracic Echocardiogram: Date: 09/11/2015 Results: Study Conclusions   - Left ventricle: The cavity size was normal. There was mild    concentric hypertrophy. Systolic function was normal. The    estimated ejection fraction was in the range of 50% to 55%. Wall    motion was normal; there were no regional wall motion    abnormalities. Doppler parameters are consistent with abnormal    left ventricular relaxation (grade 1 diastolic dysfunction).  - Left atrium: The atrium was mildly dilated.   CT/PE: Date: 09/10/2015 Results: Aortic Valve Calcification, no significant CAC R sided effusion  Recent Labs: No results found for requested labs within last 8760 hours.  Recent Lipid Panel    Component Value Date/Time   CHOL 159 03/30/2016 0956   TRIG 142 03/30/2016 0956   HDL 95 03/30/2016 0956   CHOLHDL 1.7 03/30/2016 0956   VLDL 28 03/30/2016 0956   LDLCALC 36 03/30/2016 0956     Physical Exam:    VS:  BP 120/68   Pulse 94   Ht 5' 4"  (1.626 m)   Wt 155 lb 3.2 oz (70.4 kg)   SpO2 96%   BMI 26.64 kg/m     Wt Readings from Last 3 Encounters:  01/27/21 155 lb 3.2 oz (70.4 kg)  04/16/20 151 lb 9.6 oz (68.8 kg)  03/19/20 144 lb (65.3 kg)     GEN:  Well nourished, well developed in no acute distress HEENT: Normal NECK: No JVD LYMPHATICS: No lymphadenopathy CARDIAC: RRR, no murmurs,  rubs, gallops RESPIRATORY:  good air movement with expiratory wheezes ABDOMEN: Soft, non-tender, non-distended MUSCULOSKELETAL:  No edema; No deformity  SKIN: Warm and dry NEUROLOGIC:  Alert and oriented x 3 PSYCHIATRIC:  Normal affect   ASSESSMENT:    1. Abnormal nuclear stress test   2. Nonspecific abnormal electrocardiogram (ECG) (EKG)   3. Chronic obstructive pulmonary disease, unspecified COPD type ([place])   4. Primary hypertension    PLAN:    Preoperative Risk Assessment COPD, HTN, HLD, CAD NOS History of Breast Cancer - The Revised Cardiac Risk Index = 1 for possible CAD which equates to 0.9%: low risk of perioperative myocardial infarction, pulmonary edema, ventricular fibrillation, cardiac arrest, or complete heart block.  - DASI score of 7.2 associated with 3.63 functional mets - No further cardiac testing is recommended prior to eye surgery.  - The patient may proceed to surgery at acceptable risk.   - prior to moderate risk surgeries, she would benefit from CCTA; discuss with patient and sister who are amenable to this - will continue ASA and metoprolol; given ? Stress test in the past, we discussed LHC vs CCTA, vs med mgmt; patient note she has never had CP and would like the least invasive therapy possible - LDL < 70 on no medication (does not take the statin) - no CP and no nitro use  One year follow up unless new symptoms or abnormal test results warranting change in plan    Medication Adjustments/Labs and Tests Ordered: Current medicines are  reviewed at length with the patient today.  Concerns regarding medicines are outlined above.  Orders Placed This Encounter  Procedures   EKG 12-Lead    No orders of the defined types were placed in this encounter.   Patient Instructions  Medication Instructions:  Your physician has recommended you make the following change in your medication:  STOP: AZOR STOP: ATORVASTATIN  STOP: NITROGLYCERIN  *If you need a refill on your cardiac medications before your next appointment, please call your pharmacy*   Lab Work: NONE If you have labs (blood work) drawn today and your tests are completely normal, you will receive your results only by: [place] (if you have MyChart) OR A paper copy in the mail If you have any lab test that is abnormal or we need to change your treatment, we will call you to review the results.   Testing/Procedures: NONE    Follow-Up: At [facility], you and your health needs are our priority.  As part of our continuing mission to provide you with exceptional heart care, we have created designated Provider Care Teams.  These Care Teams include your primary Cardiologist (physician) and Advanced Practice Providers (APPs -  Physician Assistants and Nurse Practitioners) who all work together to provide you with the care you need, when you need it.  We recommend signing up for the patient portal called "MyChart".  Sign up information is provided on this After Visit Summary.  MyChart is used to connect with patients for Virtual Visits (Telemedicine).  Patients are able to view lab/test results, encounter notes, upcoming appointments, etc.  Non-urgent messages can be sent to your provider as well.   To learn more about what you can do with MyChart, go to [email or web address].    Your next appointment:   1 year(s)  The format for your next appointment:   In Person  Provider:   You may see Rudean Haskell, MD or one of the following Advanced Practice  Providers on your  designated Care Team:   Melina Copa, PA-C Ermalinda Barrios, PA-C        Signed, Werner Lean, MD  01/27/2021 4:33 PM    [place]

## 2021-04-03 ENCOUNTER — Encounter: Payer: Self-pay | Admitting: Emergency Medicine

## 2021-04-03 ENCOUNTER — Emergency Department (HOSPITAL_COMMUNITY): Payer: Medicare Other

## 2021-04-03 ENCOUNTER — Other Ambulatory Visit: Payer: Self-pay

## 2021-04-03 ENCOUNTER — Encounter (HOSPITAL_COMMUNITY): Payer: Self-pay

## 2021-04-03 ENCOUNTER — Inpatient Hospital Stay (HOSPITAL_COMMUNITY)
Admission: EM | Admit: 2021-04-03 | Discharge: 2021-05-05 | DRG: 438 | Disposition: E | Payer: Medicare Other | Attending: Internal Medicine | Admitting: Internal Medicine

## 2021-04-03 DIAGNOSIS — F1010 Alcohol abuse, uncomplicated: Secondary | ICD-10-CM | POA: Diagnosis present

## 2021-04-03 DIAGNOSIS — D696 Thrombocytopenia, unspecified: Secondary | ICD-10-CM | POA: Diagnosis present

## 2021-04-03 DIAGNOSIS — Z96643 Presence of artificial hip joint, bilateral: Secondary | ICD-10-CM | POA: Diagnosis present

## 2021-04-03 DIAGNOSIS — Z66 Do not resuscitate: Secondary | ICD-10-CM | POA: Diagnosis not present

## 2021-04-03 DIAGNOSIS — I468 Cardiac arrest due to other underlying condition: Secondary | ICD-10-CM | POA: Diagnosis not present

## 2021-04-03 DIAGNOSIS — K8020 Calculus of gallbladder without cholecystitis without obstruction: Secondary | ICD-10-CM | POA: Diagnosis present

## 2021-04-03 DIAGNOSIS — Z825 Family history of asthma and other chronic lower respiratory diseases: Secondary | ICD-10-CM

## 2021-04-03 DIAGNOSIS — I4891 Unspecified atrial fibrillation: Secondary | ICD-10-CM | POA: Diagnosis not present

## 2021-04-03 DIAGNOSIS — N179 Acute kidney failure, unspecified: Secondary | ICD-10-CM | POA: Diagnosis not present

## 2021-04-03 DIAGNOSIS — Y92239 Unspecified place in hospital as the place of occurrence of the external cause: Secondary | ICD-10-CM | POA: Diagnosis not present

## 2021-04-03 DIAGNOSIS — D72819 Decreased white blood cell count, unspecified: Secondary | ICD-10-CM | POA: Diagnosis not present

## 2021-04-03 DIAGNOSIS — Z01818 Encounter for other preprocedural examination: Secondary | ICD-10-CM

## 2021-04-03 DIAGNOSIS — E119 Type 2 diabetes mellitus without complications: Secondary | ICD-10-CM | POA: Diagnosis not present

## 2021-04-03 DIAGNOSIS — F32A Depression, unspecified: Secondary | ICD-10-CM | POA: Diagnosis not present

## 2021-04-03 DIAGNOSIS — D62 Acute posthemorrhagic anemia: Secondary | ICD-10-CM | POA: Diagnosis present

## 2021-04-03 DIAGNOSIS — R7989 Other specified abnormal findings of blood chemistry: Secondary | ICD-10-CM | POA: Diagnosis present

## 2021-04-03 DIAGNOSIS — E7220 Disorder of urea cycle metabolism, unspecified: Secondary | ICD-10-CM | POA: Diagnosis present

## 2021-04-03 DIAGNOSIS — Z853 Personal history of malignant neoplasm of breast: Secondary | ICD-10-CM

## 2021-04-03 DIAGNOSIS — Z20822 Contact with and (suspected) exposure to covid-19: Secondary | ICD-10-CM | POA: Diagnosis not present

## 2021-04-03 DIAGNOSIS — Z4659 Encounter for fitting and adjustment of other gastrointestinal appliance and device: Secondary | ICD-10-CM

## 2021-04-03 DIAGNOSIS — I10 Essential (primary) hypertension: Secondary | ICD-10-CM | POA: Diagnosis present

## 2021-04-03 DIAGNOSIS — R109 Unspecified abdominal pain: Secondary | ICD-10-CM

## 2021-04-03 DIAGNOSIS — Z888 Allergy status to other drugs, medicaments and biological substances status: Secondary | ICD-10-CM

## 2021-04-03 DIAGNOSIS — G9341 Metabolic encephalopathy: Secondary | ICD-10-CM | POA: Diagnosis present

## 2021-04-03 DIAGNOSIS — Z87891 Personal history of nicotine dependence: Secondary | ICD-10-CM

## 2021-04-03 DIAGNOSIS — R531 Weakness: Secondary | ICD-10-CM | POA: Diagnosis present

## 2021-04-03 DIAGNOSIS — K7010 Alcoholic hepatitis without ascites: Secondary | ICD-10-CM | POA: Diagnosis not present

## 2021-04-03 DIAGNOSIS — T17908A Unspecified foreign body in respiratory tract, part unspecified causing other injury, initial encounter: Secondary | ICD-10-CM

## 2021-04-03 DIAGNOSIS — J80 Acute respiratory distress syndrome: Secondary | ICD-10-CM | POA: Diagnosis present

## 2021-04-03 DIAGNOSIS — Z452 Encounter for adjustment and management of vascular access device: Secondary | ICD-10-CM

## 2021-04-03 DIAGNOSIS — F419 Anxiety disorder, unspecified: Secondary | ICD-10-CM | POA: Diagnosis present

## 2021-04-03 DIAGNOSIS — Z7982 Long term (current) use of aspirin: Secondary | ICD-10-CM

## 2021-04-03 DIAGNOSIS — K7290 Hepatic failure, unspecified without coma: Secondary | ICD-10-CM | POA: Diagnosis present

## 2021-04-03 DIAGNOSIS — X58XXXA Exposure to other specified factors, initial encounter: Secondary | ICD-10-CM | POA: Diagnosis not present

## 2021-04-03 DIAGNOSIS — F0390 Unspecified dementia without behavioral disturbance: Secondary | ICD-10-CM | POA: Diagnosis present

## 2021-04-03 DIAGNOSIS — I447 Left bundle-branch block, unspecified: Secondary | ICD-10-CM | POA: Diagnosis present

## 2021-04-03 DIAGNOSIS — I959 Hypotension, unspecified: Secondary | ICD-10-CM | POA: Diagnosis not present

## 2021-04-03 DIAGNOSIS — R0602 Shortness of breath: Secondary | ICD-10-CM

## 2021-04-03 DIAGNOSIS — K7682 Hepatic encephalopathy: Secondary | ICD-10-CM | POA: Diagnosis present

## 2021-04-03 DIAGNOSIS — J45909 Unspecified asthma, uncomplicated: Secondary | ICD-10-CM | POA: Diagnosis present

## 2021-04-03 DIAGNOSIS — E781 Pure hyperglyceridemia: Secondary | ICD-10-CM | POA: Diagnosis present

## 2021-04-03 DIAGNOSIS — K852 Alcohol induced acute pancreatitis without necrosis or infection: Principal | ICD-10-CM | POA: Diagnosis present

## 2021-04-03 DIAGNOSIS — T17918A Gastric contents in respiratory tract, part unspecified causing other injury, initial encounter: Secondary | ICD-10-CM | POA: Diagnosis not present

## 2021-04-03 DIAGNOSIS — K759 Inflammatory liver disease, unspecified: Secondary | ICD-10-CM | POA: Diagnosis present

## 2021-04-03 DIAGNOSIS — Z79899 Other long term (current) drug therapy: Secondary | ICD-10-CM

## 2021-04-03 DIAGNOSIS — J449 Chronic obstructive pulmonary disease, unspecified: Secondary | ICD-10-CM | POA: Diagnosis present

## 2021-04-03 DIAGNOSIS — M109 Gout, unspecified: Secondary | ICD-10-CM | POA: Diagnosis present

## 2021-04-03 DIAGNOSIS — I472 Ventricular tachycardia, unspecified: Secondary | ICD-10-CM | POA: Diagnosis not present

## 2021-04-03 DIAGNOSIS — Z811 Family history of alcohol abuse and dependence: Secondary | ICD-10-CM

## 2021-04-03 DIAGNOSIS — Z9012 Acquired absence of left breast and nipple: Secondary | ICD-10-CM

## 2021-04-03 HISTORY — DX: Unspecified dementia, unspecified severity, without behavioral disturbance, psychotic disturbance, mood disturbance, and anxiety: F03.90

## 2021-04-03 LAB — URINALYSIS, ROUTINE W REFLEX MICROSCOPIC
Bilirubin Urine: NEGATIVE
Glucose, UA: NEGATIVE mg/dL
Ketones, ur: NEGATIVE mg/dL
Leukocytes,Ua: NEGATIVE
Nitrite: NEGATIVE
Protein, ur: 30 mg/dL — AB
Specific Gravity, Urine: 1.015 (ref 1.005–1.030)
pH: 5 (ref 5.0–8.0)

## 2021-04-03 LAB — ETHANOL: Alcohol, Ethyl (B): 10 mg/dL — ABNORMAL HIGH (ref <10)

## 2021-04-03 LAB — RESP PANEL BY RT-PCR (FLU A&B, COVID) ARPGX2
Influenza A by PCR: NEGATIVE
Influenza B by PCR: NEGATIVE
SARS Coronavirus 2 by RT PCR: NEGATIVE

## 2021-04-03 LAB — CBC WITH DIFFERENTIAL/PLATELET
Abs Immature Granulocytes: 0.08 10*3/uL — ABNORMAL HIGH (ref 0.00–0.07)
Basophils Absolute: 0 10*3/uL (ref 0.0–0.1)
Basophils Relative: 0 %
Eosinophils Absolute: 0 10*3/uL (ref 0.0–0.5)
Eosinophils Relative: 0 %
HCT: 38.1 % (ref 36.0–46.0)
Hemoglobin: 12.4 g/dL (ref 12.0–15.0)
Immature Granulocytes: 1 %
Lymphocytes Relative: 10 %
Lymphs Abs: 0.8 10*3/uL (ref 0.7–4.0)
MCH: 36.7 pg — ABNORMAL HIGH (ref 26.0–34.0)
MCHC: 32.5 g/dL (ref 30.0–36.0)
MCV: 112.7 fL — ABNORMAL HIGH (ref 80.0–100.0)
Monocytes Absolute: 0.4 10*3/uL (ref 0.1–1.0)
Monocytes Relative: 5 %
Neutro Abs: 6.6 10*3/uL (ref 1.7–7.7)
Neutrophils Relative %: 84 %
Platelets: 159 10*3/uL (ref 150–400)
RBC: 3.38 MIL/uL — ABNORMAL LOW (ref 3.87–5.11)
RDW: 21.2 % — ABNORMAL HIGH (ref 11.5–15.5)
WBC: 7.9 10*3/uL (ref 4.0–10.5)
nRBC: 0.6 % — ABNORMAL HIGH (ref 0.0–0.2)

## 2021-04-03 LAB — AMMONIA: Ammonia: 63 umol/L — ABNORMAL HIGH (ref 9–35)

## 2021-04-03 LAB — COMPREHENSIVE METABOLIC PANEL
ALT: 120 U/L — ABNORMAL HIGH (ref 0–44)
AST: 204 U/L — ABNORMAL HIGH (ref 15–41)
Albumin: 2.5 g/dL — ABNORMAL LOW (ref 3.5–5.0)
Alkaline Phosphatase: 315 U/L — ABNORMAL HIGH (ref 38–126)
Anion gap: 20 — ABNORMAL HIGH (ref 5–15)
BUN: 25 mg/dL — ABNORMAL HIGH (ref 8–23)
CO2: 22 mmol/L (ref 22–32)
Calcium: 7.9 mg/dL — ABNORMAL LOW (ref 8.9–10.3)
Chloride: 98 mmol/L (ref 98–111)
Creatinine, Ser: 2.17 mg/dL — ABNORMAL HIGH (ref 0.44–1.00)
GFR, Estimated: 23 mL/min — ABNORMAL LOW (ref >60)
Glucose, Bld: 263 mg/dL — ABNORMAL HIGH (ref 70–99)
Potassium: 4.9 mmol/L (ref 3.5–5.1)
Sodium: 140 mmol/L (ref 135–145)
Total Bilirubin: 5.7 mg/dL — ABNORMAL HIGH (ref 0.3–1.2)
Total Protein: 5.6 g/dL — ABNORMAL LOW (ref 6.5–8.1)

## 2021-04-03 LAB — LACTIC ACID, PLASMA
Lactic Acid, Venous: 6.6 mmol/L (ref 0.5–1.9)
Lactic Acid, Venous: 3.5 mmol/L (ref 0.5–1.9)

## 2021-04-03 LAB — PROTIME-INR
INR: 1.2 (ref 0.8–1.2)
Prothrombin Time: 15.2 seconds (ref 11.4–15.2)

## 2021-04-03 LAB — ACETAMINOPHEN LEVEL: Acetaminophen (Tylenol), Serum: <10 ug/mL — ABNORMAL LOW (ref 10–30)

## 2021-04-03 MED ORDER — LACTATED RINGERS IV BOLUS
1000.0000 mL | Freq: Once | INTRAVENOUS | Status: AC
  Administered 2021-04-03: 1000 mL via INTRAVENOUS

## 2021-04-03 MED ORDER — LACTATED RINGERS IV SOLN: INTRAVENOUS | Status: DC

## 2021-04-03 NOTE — ED Triage Notes (Signed)
Family called because the patient has not eaten much in the past 4 days and AMS. She has been weaker the past couple of days with decreased abilities to preform ADLs. EMS noted she was incontinent with foul smelling urine.    HX: Dementia, noncompliant with medications, Diabetes  EMS vitals: 122/78 BP 100 HR 317 CBG

## 2021-04-03 NOTE — ED Provider Notes (Signed)
[facility] DEPT Provider Note   CSN: [id number] Arrival date & time: 04/01/2021  1309     History Chief Complaint  Patient presents with   Weakness    [name] is a 75 y.o. female.  75 year old female presents with increased weakness as well as decreased oral intake.  Patient has a history of dementia and concern for proper altered mental status.  Has had foul-smelling urine.  No reported cough or congestion.  History is limited due to her current condition      Past Medical History:  Diagnosis Date   Allergy    Anxiety    Arthritis    "was in my hips"   Asthma    Breast cancer ([place])    S/P left mastectomy 2015   Dementia ([place])    Depression    Hypertension    Hypertriglyceridemia    Migraine 1980's    Patient Active Problem List   Diagnosis Date Noted   Dementia ([place]) 03/09/2021   Abnormal nuclear stress test 01/27/2021   Nonspecific abnormal electrocardiogram (ECG) (EKG) 01/27/2021   Weakness 09/10/2015   Hypokalemia 09/10/2015   Chronic obstructive pulmonary disease ([place]) 09/10/2015   Hypomagnesemia 09/10/2015   Gout 08/20/2015   Skin flap necrosis ([place]) 01/17/2014   Breast cancer of upper-inner quadrant of left female breast ([place]) 11/16/2013   Abnormal LFTs--hep c neg 08/24/2013   HNP (herniated nucleus pulposus), thoracic 07/17/2013   History of tobacco use 05/24/2013   Depression with anxiety 05/24/2013   Vitamin D deficiency 05/24/2013   Venous insufficiency 05/24/2013   H/O bilateral hip replacements 01/16/2013   DM (diabetes mellitus) ([place]) 11/19/2011   Primary hypertension 11/19/2011   Asthma 11/19/2011    Past Surgical History:  Procedure Laterality Date   ABDOMINAL HYSTERECTOMY  1981   "fibroids"   ANKLE FRACTURE SURGERY Left 03/15/2000   with hardware   APPLICATION OF WOUND VAC  01/17/2014   w/chest wall debridement   APPLICATION OF WOUND VAC Left 01/17/2014   Procedure: PLACEMENT OF VAC DRESSING;   Surgeon: Merrie Roof, MD;  Location: [facility];  Service: General;  Laterality: Left;   BREAST SURGERY Left    removed due to BCA   COLONOSCOPY     JOINT REPLACEMENT     MASTECTOMY COMPLETE / SIMPLE W/ SENTINEL NODE BIOPSY Left    MASTECTOMY W/ SENTINEL NODE BIOPSY Left 12/20/2013   Procedure: MASTECTOMY WITH SENTINEL LYMPH NODE BIOPSY;  Surgeon: Merrie Roof, MD;  Location: [facility];  Service: General;  Laterality: Left;   ORIF SHOULDER FRACTURE Left [place] Right    TOTAL HIP ARTHROPLASTY Right    TOTAL HIP ARTHROPLASTY Left    TUBAL LIGATION  1973   WOUND DEBRIDEMENT Left 01/17/2014   Procedure: DEBRIDEMENT OF CHEST WALL;  Surgeon: Merrie Roof, MD;  Location: [facility];  Service: General;  Laterality: Left;     OB History   No obstetric history on file.     Family History  Problem Relation Age of Onset   Asthma Mother    Stroke Mother    Asthma Father    Alcohol abuse Father    Stroke Sister    Colon cancer Neg Hx     Social History   Tobacco Use   Smoking status: Former    Packs/day: 0.50    Years: 40.00    Pack years: 20.00    Types: Cigarettes  Quit date: 11/11/2000    Years since quitting: 20.4   Smokeless tobacco: Never  Substance Use Topics   Alcohol use: Yes    Comment: occasionally   Drug use: Yes    Types: Marijuana    Comment: "last smoked marijuana in ~ 2010; recreational user only"    Home Medications Prior to Admission medications   Medication Sig Start Date End Date Taking? Authorizing Provider  acetaminophen-codeine (TYLENOL #3) 300-30 MG tablet Take by mouth as needed. 03/04/20   [provider]  aspirin EC 81 MG tablet Take 81 mg by mouth daily. Swallow whole.    [provider]  metoprolol succinate (TOPROL-XL) 25 MG 24 hr tablet Take 1 tablet (25 mg total) by mouth in the morning. 04/24/20   Tolia, Sunit, DO    Allergies    Ace inhibitors and Angiotensin receptor blockers  Review of Systems    Review of Systems  Unable to perform ROS: Dementia   Physical Exam Updated Vital Signs BP 105/67 (BP Location: Right Arm)   Pulse 74   Temp 98.5 F (36.9 C) (Rectal)   Resp (!) 21   Ht 1.626 m (5\' 4" )   Wt 68 kg   SpO2 95%   BMI 25.75 kg/m   Physical Exam Vitals and nursing note reviewed.  Constitutional:      General: She is not in acute distress.    Appearance: Normal appearance. She is well-developed. She is not toxic-appearing.  HENT:     Head: Normocephalic and atraumatic.  Eyes:     General: Lids are normal.     Conjunctiva/sclera: Conjunctivae normal.     Pupils: Pupils are equal, round, and reactive to light.  Neck:     Thyroid: No thyroid mass.     Trachea: No tracheal deviation.  Cardiovascular:     Rate and Rhythm: Normal rate and regular rhythm.     Heart sounds: Normal heart sounds. No murmur heard.   No gallop.  Pulmonary:     Effort: Pulmonary effort is normal. No respiratory distress.     Breath sounds: Normal breath sounds. No stridor. No decreased breath sounds, wheezing, rhonchi or rales.  Abdominal:     General: There is no distension.     Palpations: Abdomen is soft.     Tenderness: There is no abdominal tenderness. There is no rebound.  Musculoskeletal:        General: No tenderness. Normal range of motion.     Cervical back: Normal range of motion and neck supple.  Skin:    General: Skin is warm and dry.     Findings: No abrasion or rash.  Neurological:     Mental Status: She is alert. She is disoriented.     GCS: GCS eye subscore is 4. GCS verbal subscore is 5. GCS motor subscore is 6.     Cranial Nerves: Cranial nerves are intact. No cranial nerve deficit.     Sensory: No sensory deficit.     Motor: Motor function is intact.  Psychiatric:        Attention and Perception: She is inattentive.        Speech: Speech is delayed.        Behavior: Behavior is withdrawn.    ED Results / Procedures / Treatments   Labs (all labs ordered  are listed, but only abnormal results are displayed) Labs Reviewed  RESP PANEL BY RT-PCR (FLU A&B, COVID) ARPGX2  CULTURE, BLOOD (ROUTINE X 2)  CULTURE,  BLOOD (ROUTINE X 2)  URINALYSIS, ROUTINE W REFLEX MICROSCOPIC  CBC WITH DIFFERENTIAL/PLATELET  COMPREHENSIVE METABOLIC PANEL  LACTIC ACID, PLASMA    EKG None  Radiology No results found.  Procedures Procedures   Medications Ordered in ED Medications  lactated ringers bolus 1,000 mL (has no administration in time range)  lactated ringers infusion (has no administration in time range)    ED Course  I have reviewed the triage vital signs and the nursing notes.  Pertinent labs & imaging results that were available during my care of the patient were reviewed by me and considered in my medical decision making (see chart for details).    MDM Rules/Calculators/A&P                           Work-up is pending at this time assigned to Dr. Alvino Chapel  Final Clinical Impression(s) / ED Diagnoses Final diagnoses:  None    Rx / DC Orders ED Discharge Orders     None        Lacretia Leigh, MD 03/06/2021 1524

## 2021-04-03 NOTE — ED Provider Notes (Signed)
  Physical Exam  BP 134/64   Pulse (!) 102   Temp 98.5 F (36.9 C) (Rectal)   Resp 17   Ht 5\' 4"  (1.626 m)   Wt 68 kg   SpO2 98%   BMI 25.75 kg/m   Physical Exam  ED Course/Procedures     Procedures  MDM  Patient brought in for mental status change.  Reportedly eating and drinking less.  No confusion.Does have some dementia but normally would be more conversive than she is now.  Received patient in signout.  Had elevated LFTs.  Bilirubin also elevated.  Per family member with patient, the patient is I heavy alcohol user.  Drinks very heavily.  Urine does not show infection.  Creatinine elevated to 2.  Liver ultrasound done and reassuring.  Bilirubin is elevated at 5.7.  INR pending.  Have added on hepatitis panel and ammonia.  Also ethanol.  Initial lactic elevated but doubt sepsis.  Will repeat.  However I think patient require admission to the hospital.  Will discuss with hospitalist       Davonna Belling, MD 03/28/2021 2023

## 2021-04-04 ENCOUNTER — Inpatient Hospital Stay (HOSPITAL_COMMUNITY): Payer: Medicare Other | Admitting: Certified Registered Nurse Anesthetist

## 2021-04-04 ENCOUNTER — Inpatient Hospital Stay (HOSPITAL_COMMUNITY): Payer: Medicare Other

## 2021-04-04 DIAGNOSIS — J449 Chronic obstructive pulmonary disease, unspecified: Secondary | ICD-10-CM | POA: Diagnosis present

## 2021-04-04 DIAGNOSIS — Z853 Personal history of malignant neoplasm of breast: Secondary | ICD-10-CM | POA: Diagnosis not present

## 2021-04-04 DIAGNOSIS — E7220 Disorder of urea cycle metabolism, unspecified: Secondary | ICD-10-CM | POA: Diagnosis present

## 2021-04-04 DIAGNOSIS — R7989 Other specified abnormal findings of blood chemistry: Secondary | ICD-10-CM

## 2021-04-04 DIAGNOSIS — I10 Essential (primary) hypertension: Secondary | ICD-10-CM | POA: Diagnosis present

## 2021-04-04 DIAGNOSIS — N179 Acute kidney failure, unspecified: Secondary | ICD-10-CM | POA: Diagnosis present

## 2021-04-04 DIAGNOSIS — X58XXXA Exposure to other specified factors, initial encounter: Secondary | ICD-10-CM | POA: Diagnosis not present

## 2021-04-04 DIAGNOSIS — K852 Alcohol induced acute pancreatitis without necrosis or infection: Secondary | ICD-10-CM | POA: Diagnosis present

## 2021-04-04 DIAGNOSIS — Z20822 Contact with and (suspected) exposure to covid-19: Secondary | ICD-10-CM | POA: Diagnosis present

## 2021-04-04 DIAGNOSIS — F0390 Unspecified dementia without behavioral disturbance: Secondary | ICD-10-CM | POA: Diagnosis present

## 2021-04-04 DIAGNOSIS — D696 Thrombocytopenia, unspecified: Secondary | ICD-10-CM | POA: Diagnosis present

## 2021-04-04 DIAGNOSIS — I4891 Unspecified atrial fibrillation: Secondary | ICD-10-CM | POA: Diagnosis present

## 2021-04-04 DIAGNOSIS — E119 Type 2 diabetes mellitus without complications: Secondary | ICD-10-CM | POA: Diagnosis present

## 2021-04-04 DIAGNOSIS — D72819 Decreased white blood cell count, unspecified: Secondary | ICD-10-CM | POA: Diagnosis present

## 2021-04-04 DIAGNOSIS — J80 Acute respiratory distress syndrome: Secondary | ICD-10-CM | POA: Diagnosis present

## 2021-04-04 DIAGNOSIS — Z66 Do not resuscitate: Secondary | ICD-10-CM | POA: Diagnosis not present

## 2021-04-04 DIAGNOSIS — F1010 Alcohol abuse, uncomplicated: Secondary | ICD-10-CM | POA: Diagnosis present

## 2021-04-04 DIAGNOSIS — R9431 Abnormal electrocardiogram [ECG] [EKG]: Secondary | ICD-10-CM | POA: Diagnosis not present

## 2021-04-04 DIAGNOSIS — F32A Depression, unspecified: Secondary | ICD-10-CM | POA: Diagnosis present

## 2021-04-04 DIAGNOSIS — Y92239 Unspecified place in hospital as the place of occurrence of the external cause: Secondary | ICD-10-CM | POA: Diagnosis not present

## 2021-04-04 DIAGNOSIS — F03A0 Unspecified dementia, mild, without behavioral disturbance, psychotic disturbance, mood disturbance, and anxiety: Secondary | ICD-10-CM

## 2021-04-04 DIAGNOSIS — D62 Acute posthemorrhagic anemia: Secondary | ICD-10-CM | POA: Diagnosis present

## 2021-04-04 DIAGNOSIS — K7290 Hepatic failure, unspecified without coma: Secondary | ICD-10-CM | POA: Diagnosis present

## 2021-04-04 DIAGNOSIS — K7682 Hepatic encephalopathy: Secondary | ICD-10-CM | POA: Diagnosis present

## 2021-04-04 DIAGNOSIS — K759 Inflammatory liver disease, unspecified: Secondary | ICD-10-CM | POA: Diagnosis present

## 2021-04-04 DIAGNOSIS — I472 Ventricular tachycardia, unspecified: Secondary | ICD-10-CM | POA: Diagnosis not present

## 2021-04-04 DIAGNOSIS — I959 Hypotension, unspecified: Secondary | ICD-10-CM | POA: Diagnosis not present

## 2021-04-04 DIAGNOSIS — K7010 Alcoholic hepatitis without ascites: Secondary | ICD-10-CM | POA: Diagnosis present

## 2021-04-04 DIAGNOSIS — I468 Cardiac arrest due to other underlying condition: Secondary | ICD-10-CM | POA: Diagnosis not present

## 2021-04-04 DIAGNOSIS — I469 Cardiac arrest, cause unspecified: Secondary | ICD-10-CM | POA: Diagnosis not present

## 2021-04-04 DIAGNOSIS — G9341 Metabolic encephalopathy: Secondary | ICD-10-CM | POA: Diagnosis present

## 2021-04-04 LAB — CBC
HCT: 38.6 % (ref 36.0–46.0)
Hemoglobin: 12.9 g/dL (ref 12.0–15.0)
MCH: 36.5 pg — ABNORMAL HIGH (ref 26.0–34.0)
MCHC: 33.4 g/dL (ref 30.0–36.0)
MCV: 109.3 fL — ABNORMAL HIGH (ref 80.0–100.0)
Platelets: 131 10*3/uL — ABNORMAL LOW (ref 150–400)
RBC: 3.53 MIL/uL — ABNORMAL LOW (ref 3.87–5.11)
RDW: 20.5 % — ABNORMAL HIGH (ref 11.5–15.5)
WBC: 7 10*3/uL (ref 4.0–10.5)
nRBC: 1.9 % — ABNORMAL HIGH (ref 0.0–0.2)

## 2021-04-04 LAB — ECHOCARDIOGRAM COMPLETE
Height: 64 in
Weight: 2400 oz

## 2021-04-04 LAB — COMPREHENSIVE METABOLIC PANEL
ALT: 104 U/L — ABNORMAL HIGH (ref 0–44)
AST: 173 U/L — ABNORMAL HIGH (ref 15–41)
Albumin: 2.2 g/dL — ABNORMAL LOW (ref 3.5–5.0)
Alkaline Phosphatase: 287 U/L — ABNORMAL HIGH (ref 38–126)
Anion gap: 13 (ref 5–15)
BUN: 29 mg/dL — ABNORMAL HIGH (ref 8–23)
CO2: 23 mmol/L (ref 22–32)
Calcium: 7.3 mg/dL — ABNORMAL LOW (ref 8.9–10.3)
Chloride: 103 mmol/L (ref 98–111)
Creatinine, Ser: 2.05 mg/dL — ABNORMAL HIGH (ref 0.44–1.00)
GFR, Estimated: 25 mL/min — ABNORMAL LOW (ref >60)
Glucose, Bld: 232 mg/dL — ABNORMAL HIGH (ref 70–99)
Potassium: 3.7 mmol/L (ref 3.5–5.1)
Sodium: 139 mmol/L (ref 135–145)
Total Bilirubin: 4.7 mg/dL — ABNORMAL HIGH (ref 0.3–1.2)
Total Protein: 5.2 g/dL — ABNORMAL LOW (ref 6.5–8.1)

## 2021-04-04 LAB — GLUCOSE, CAPILLARY
Glucose-Capillary: 113 mg/dL — ABNORMAL HIGH (ref 70–99)
Glucose-Capillary: 164 mg/dL — ABNORMAL HIGH (ref 70–99)
Glucose-Capillary: 210 mg/dL — ABNORMAL HIGH (ref 70–99)
Glucose-Capillary: 50 mg/dL — ABNORMAL LOW (ref 70–99)
Glucose-Capillary: 82 mg/dL (ref 70–99)

## 2021-04-04 LAB — HEMOGLOBIN A1C
Hgb A1c MFr Bld: 5.9 % — ABNORMAL HIGH (ref 4.8–5.6)
Mean Plasma Glucose: 122.63 mg/dL

## 2021-04-04 LAB — HEPATITIS PANEL, ACUTE
HCV Ab: NONREACTIVE
Hep A IgM: NONREACTIVE
Hep B C IgM: NONREACTIVE
Hepatitis B Surface Ag: NONREACTIVE

## 2021-04-04 LAB — AMMONIA: Ammonia: 54 umol/L — ABNORMAL HIGH (ref 9–35)

## 2021-04-04 LAB — CBG MONITORING, ED: Glucose-Capillary: 257 mg/dL — ABNORMAL HIGH (ref 70–99)

## 2021-04-04 LAB — LIPASE, BLOOD: Lipase: 1403 U/L — ABNORMAL HIGH (ref 11–51)

## 2021-04-04 MED ORDER — LORAZEPAM 1 MG PO TABS: 0.0000 mg | ORAL_TABLET | Freq: Four times a day (QID) | ORAL | Status: DC

## 2021-04-04 MED ORDER — PHENYLEPHRINE HCL (PRESSORS) 10 MG/ML IV SOLN
0.0000 ug/min | INTRAVENOUS | Status: DC
  Filled 2021-04-04: qty 2

## 2021-04-04 MED ORDER — DILTIAZEM HCL 25 MG/5ML IV SOLN
5.0000 mg | Freq: Four times a day (QID) | INTRAVENOUS | Status: DC
  Administered 2021-04-04 (×2): 5 mg via INTRAVENOUS
  Filled 2021-04-04 (×5): qty 5

## 2021-04-04 MED ORDER — IBUPROFEN 200 MG PO TABS: 400.0000 mg | ORAL_TABLET | Freq: Four times a day (QID) | ORAL | Status: DC | PRN

## 2021-04-04 MED ORDER — LORAZEPAM 2 MG/ML IJ SOLN
1.0000 mg | INTRAMUSCULAR | Status: DC | PRN
  Administered 2021-04-04: 2 mg via INTRAVENOUS
  Administered 2021-04-04: 1 mg via INTRAVENOUS
  Filled 2021-04-04 (×2): qty 1

## 2021-04-04 MED ORDER — ADULT MULTIVITAMIN W/MINERALS CH
1.0000 | ORAL_TABLET | Freq: Every day | ORAL | Status: DC
  Administered 2021-04-04: 1 via ORAL
  Filled 2021-04-04: qty 1

## 2021-04-04 MED ORDER — THIAMINE HCL 100 MG PO TABS: 100.0000 mg | ORAL_TABLET | Freq: Every day | ORAL | Status: DC

## 2021-04-04 MED ORDER — EPINEPHRINE 1 MG/10ML IJ SOSY
PREFILLED_SYRINGE | INTRAMUSCULAR | Status: AC
  Filled 2021-04-04: qty 10

## 2021-04-04 MED ORDER — SENNA 8.6 MG PO TABS
1.0000 | ORAL_TABLET | Freq: Two times a day (BID) | ORAL | Status: DC
  Administered 2021-04-04: 8.6 mg via ORAL
  Filled 2021-04-04 (×2): qty 1

## 2021-04-04 MED ORDER — THIAMINE HCL 100 MG/ML IJ SOLN
100.0000 mg | Freq: Every day | INTRAMUSCULAR | Status: DC
  Administered 2021-04-04: 100 mg via INTRAVENOUS
  Filled 2021-04-04: qty 2

## 2021-04-04 MED ORDER — INSULIN ASPART 100 UNIT/ML IJ SOLN
0.0000 [IU] | Freq: Three times a day (TID) | INTRAMUSCULAR | Status: DC
  Administered 2021-04-04: 8 [IU] via SUBCUTANEOUS
  Administered 2021-04-04: 3 [IU] via SUBCUTANEOUS
  Administered 2021-04-04: 5 [IU] via SUBCUTANEOUS
  Filled 2021-04-04: qty 0.15

## 2021-04-04 MED ORDER — LACTULOSE ENEMA
300.0000 mL | Freq: Once | ORAL | Status: DC
  Filled 2021-04-04 (×2): qty 300

## 2021-04-04 MED ORDER — DILTIAZEM HCL 30 MG PO TABS
60.0000 mg | ORAL_TABLET | Freq: Three times a day (TID) | ORAL | Status: DC
  Administered 2021-04-04: 60 mg via ORAL
  Filled 2021-04-04: qty 2

## 2021-04-04 MED ORDER — PERFLUTREN LIPID MICROSPHERE
1.0000 mL | INTRAVENOUS | Status: AC | PRN
  Administered 2021-04-04: 4 mL via INTRAVENOUS
  Filled 2021-04-04: qty 10

## 2021-04-04 MED ORDER — LACTULOSE 10 GM/15ML PO SOLN
30.0000 g | Freq: Two times a day (BID) | ORAL | Status: DC
  Administered 2021-04-04 (×2): 30 g via ORAL
  Filled 2021-04-04 (×2): qty 45
  Filled 2021-04-04: qty 60

## 2021-04-04 MED ORDER — SODIUM CHLORIDE 0.45 % IV SOLN: INTRAVENOUS | Status: DC

## 2021-04-04 MED ORDER — LORAZEPAM 1 MG PO TABS
0.0000 mg | ORAL_TABLET | Freq: Two times a day (BID) | ORAL | Status: DC
Start: 2021-04-06 — End: 2021-04-04

## 2021-04-04 MED ORDER — ENOXAPARIN SODIUM 30 MG/0.3ML IJ SOSY
30.0000 mg | PREFILLED_SYRINGE | INTRAMUSCULAR | Status: DC
  Administered 2021-04-04: 30 mg via SUBCUTANEOUS
  Filled 2021-04-04: qty 0.3

## 2021-04-04 MED ORDER — SODIUM BICARBONATE 8.4 % IV SOLN
INTRAVENOUS | Status: AC
  Filled 2021-04-04: qty 50

## 2021-04-04 MED ORDER — LORAZEPAM 1 MG PO TABS: 1.0000 mg | ORAL_TABLET | ORAL | Status: DC | PRN

## 2021-04-04 MED ORDER — METOPROLOL TARTRATE 12.5 MG HALF TABLET: 12.5000 mg | ORAL_TABLET | Freq: Two times a day (BID) | ORAL | Status: DC

## 2021-04-04 MED ORDER — METOPROLOL SUCCINATE ER 25 MG PO TB24: 25.0000 mg | ORAL_TABLET | Freq: Every day | ORAL | Status: DC

## 2021-04-04 MED ORDER — FOLIC ACID 1 MG PO TABS
1.0000 mg | ORAL_TABLET | Freq: Every day | ORAL | Status: DC
  Administered 2021-04-04: 1 mg via ORAL
  Filled 2021-04-04: qty 1

## 2021-04-04 MED ORDER — DILTIAZEM HCL 25 MG/5ML IV SOLN
5.0000 mg | Freq: Once | INTRAVENOUS | Status: AC
  Administered 2021-04-04: 5 mg via INTRAVENOUS
  Filled 2021-04-04: qty 5

## 2021-04-04 NOTE — Progress Notes (Signed)
Echocardiogram 2D Echocardiogram has been performed.  Oneal Deputy Giulianna Rocha RDCS 04/04/2021, 8:43 AM

## 2021-04-04 NOTE — ED Notes (Signed)
[name], APP notified about patients risk of taking future dose of oral medications due to her choking within the last hour on her saliva. RN asked to switch medications to IV.

## 2021-04-04 NOTE — ED Notes (Signed)
Patient was able to take pills crushed in applesauce.

## 2021-04-04 NOTE — Progress Notes (Signed)
Nurse and nurse tech in room to take vitals and reposition pt. BP reading 60s/40s. Pt responsive to name. Rapid response called.

## 2021-04-04 NOTE — ED Notes (Signed)
Patients nurse tech, Romelle Starcher told this RN that patient was coughing so this RN went into the room and patient was not able to spit it up. RN went into room and patients breathing has a rattle sound to it. Patient still not able to cough it up. Patients oxygen saturation was at 85% room air when RN walked into the room. RN placed 5L [facility] increased to 94%. Dr. Linda Hedges notified.

## 2021-04-04 NOTE — Anesthesia Procedure Notes (Signed)
Procedure Name: Intubation Date/Time: 04/04/2021 11:42 PM Performed by: [place] Manus Rudd, CRNA Laryngoscope Size: Glidescope, 3 and Mac Grade View: Grade I Tube type: Oral Tube size: 7.0 mm Number of attempts: 1 Airway Equipment and Method: Rigid stylet Secured at: 22 cm

## 2021-04-04 NOTE — H&P (Signed)
History and Physical    [name] ELT:[id number] DOB: 17-May-1946 DOA: 03/31/2021  PCP: Jordan Hawks, FNP (Confirm with patient/family/NH records and if not entered, this has to be entered at [facility] point of entry) Patient coming from: home  I have personally briefly reviewed patient's old medical records in [place]  Chief Complaint: weakness, near syncope, incontinence  HPI: [name] is a 75 y.o. female with medical history significant of Breast cancer, dementia, anxiety, heavy alcohol use, HTN. Per family report she hasn't eating for several days. Today she appeared "fienty" and was incontinent. She had no specific complaints. She was brought to [facility] for further evaluation.  .  ED Course: T 98.5  132/ 62  HR 105,  RR 17. Lab revealed glucose 263, Cr 2.17 (baseline 0.94) BUN 25, Alk Phos 315  AST 204  ALT 120  T. Bili 5.7, NH3 63 Lactic acid 6.6 to 3.5 CBC nl, U/A with many bacteria 0-5 WBC/hpf. Abd U/S with cholelithiasis, sludge. EKG with a fib w/ RVR, incomplete LBBB LVH. TRH is called to admit for further evaluation and management  Review of Systems: As per HPI otherwise 10 point review of systems negative. Patient not talking much - dementia plus hyperammonemia   Past Medical History:  Diagnosis Date   Allergy    Anxiety    Arthritis    "was in my hips"   Asthma    Breast cancer ([place])    S/P left mastectomy 2015   Dementia ([place])    Depression    Hypertension    Hypertriglyceridemia    Migraine 1980's    Past Surgical History:  Procedure Laterality Date   ABDOMINAL HYSTERECTOMY  1981   "fibroids"   ANKLE FRACTURE SURGERY Left 03/15/2000   with hardware   APPLICATION OF WOUND VAC  01/17/2014   w/chest wall debridement   APPLICATION OF WOUND VAC Left 01/17/2014   Procedure: PLACEMENT OF VAC DRESSING;  Surgeon: Merrie Roof, MD;  Location: [place];  Service: General;  Laterality: Left;   BREAST SURGERY Left    removed due to BCA   COLONOSCOPY      JOINT REPLACEMENT     MASTECTOMY COMPLETE / SIMPLE W/ SENTINEL NODE BIOPSY Left    MASTECTOMY W/ SENTINEL NODE BIOPSY Left 12/20/2013   Procedure: MASTECTOMY WITH SENTINEL LYMPH NODE BIOPSY;  Surgeon: Merrie Roof, MD;  Location: [place];  Service: General;  Laterality: Left;   ORIF SHOULDER FRACTURE Left [place] Right    TOTAL HIP ARTHROPLASTY Right    TOTAL HIP ARTHROPLASTY Left    TUBAL LIGATION  1973   WOUND DEBRIDEMENT Left 01/17/2014   Procedure: DEBRIDEMENT OF CHEST WALL;  Surgeon: Merrie Roof, MD;  Location: [place];  Service: General;  Laterality: Left;    Soc Hx - divorcee. Has one daughter. She shares a dwelling: she lives upstairs, daughter lives downstairs. Sister in attentive. She has heavy drinking history. No history of liver disease.    reports that she quit smoking about 20 years ago. Her smoking use included cigarettes. She has a 20.00 pack-year smoking history. She has never used smokeless tobacco. She reports current alcohol use. She reports current drug use. Drug: Marijuana.  Allergies  Allergen Reactions   Ace Inhibitors Other (See Comments)    Acute renal failure   Angiotensin Receptor Blockers Other (See Comments)    Acute Renal Failure    Family History  Problem Relation Age of Onset   Asthma Mother    Stroke Mother    Asthma Father    Alcohol abuse Father    Stroke Sister    Colon cancer Neg Hx      Prior to Admission medications   Medication Sig Start Date End Date Taking? Authorizing Provider  acetaminophen-codeine (TYLENOL #3) 300-30 MG tablet Take by mouth as needed. 03/04/20   [provider]  aspirin EC 81 MG tablet Take 81 mg by mouth daily. Swallow whole. Patient not taking: Reported on 03/13/2021    [provider]  metoprolol succinate (TOPROL-XL) 25 MG 24 hr tablet Take 1 tablet (25 mg total) by mouth in the morning. Patient not taking: Reported on 03/29/2021 04/24/20   Rex Kras, DO    Physical  Exam: Vitals:   03/29/2021 2205 04/02/2021 2300 03/06/2021 2345 04/04/21 0030  BP: 116/70 127/75 132/62 131/65  Pulse: (!) 103 (!) 104 (!) 105 (!) 111  Resp: (!) 23 12 17 17   Temp:      TempSrc:      SpO2: 98% 99% 99% 97%  Weight:      Height:         Vitals:   03/14/2021 2205 03/06/2021 2300 03/26/2021 2345 04/04/21 0030  BP: 116/70 127/75 132/62 131/65  Pulse: (!) 103 (!) 104 (!) 105 (!) 111  Resp: (!) 23 12 17 17   Temp:      TempSrc:      SpO2: 98% 99% 99% 97%  Weight:      Height:       General: elderly woman who does not want to talk much. Does not seem to be in distress Eyes: PERRL, lids and conjunctivae normal ENMT: Mucous membranes are moist. Posterior pharynx clear of any exudate or lesions.  Neck: normal, supple, no masses, no thyromegaly Respiratory: clear to auscultation bilaterally, no wheezing, no crackles. Normal respiratory effort. No accessory muscle use.  Cardiovascular: Regular tachycardia. No extremity edema. 2+ pedal pulses. No carotid bruits.  Abdomen: hypoactive BS. Truncal obesity. No hepatomegaly. Tender to percussion and palpation RUQ and transverse abdomen. Mild guarding, no rebound. Musculoskeletal: no clubbing / cyanosis. No joint deformity upper and lower extremities. Good ROM, no contractures. Normal muscle tone.  Skin: no rashes, lesions, ulcers. No induration Neurologic: CN 2-12 grossly intact. Sensation intact. Strength 5/5 in all 4.  Psychiatric: Awake and Alert. Question of orientation. Withdrawn mood. Does not answer questions. Does rouse to pain and reaches for examining hand.     Labs on Admission: I have personally reviewed following labs and imaging studies  CBC: Recent Labs  Lab 03/27/2021 1551  WBC 7.9  NEUTROABS 6.6  HGB 12.4  HCT 38.1  MCV 112.7*  PLT 007   Basic Metabolic Panel: Recent Labs  Lab 03/20/2021 1551  NA 140  K 4.9  CL 98  CO2 22  GLUCOSE 263*  BUN 25*  CREATININE 2.17*  CALCIUM 7.9*   GFR: Estimated Creatinine  Clearance: 20.9 mL/min (A) (by C-G formula based on SCr of 2.17 mg/dL (H)). Liver Function Tests: Recent Labs  Lab 04/01/2021 1551  AST 204*  ALT 120*  ALKPHOS 315*  BILITOT 5.7*  PROT 5.6*  ALBUMIN 2.5*   No results for input(s): LIPASE, AMYLASE in the last 168 hours. Recent Labs  Lab 03/05/2021 1951  AMMONIA 63*   Coagulation Profile: Recent Labs  Lab 03/17/2021 2030  INR 1.2   Cardiac Enzymes: No results for input(s): CKTOTAL, CKMB, CKMBINDEX, TROPONINI  in the last 168 hours. BNP (last 3 results) No results for input(s): PROBNP in the last 8760 hours. HbA1C: No results for input(s): HGBA1C in the last 72 hours. CBG: No results for input(s): GLUCAP in the last 168 hours. Lipid Profile: No results for input(s): CHOL, HDL, LDLCALC, TRIG, CHOLHDL, LDLDIRECT in the last 72 hours. Thyroid Function Tests: No results for input(s): TSH, T4TOTAL, FREET4, T3FREE, THYROIDAB in the last 72 hours. Anemia Panel: No results for input(s): VITAMINB12, FOLATE, FERRITIN, TIBC, IRON, RETICCTPCT in the last 72 hours. Urine analysis:    Component Value Date/Time   COLORURINE AMBER (A) 03/18/2021 1551   APPEARANCEUR HAZY (A) 03/29/2021 1551   LABSPEC 1.015 03/16/2021 1551   PHURINE 5.0 03/18/2021 1551   GLUCOSEU NEGATIVE 03/08/2021 1551   HGBUR SMALL (A) 03/15/2021 1551   BILIRUBINUR NEGATIVE 03/06/2021 1551   BILIRUBINUR negative 05/17/2016 0945   BILIRUBINUR neg 05/24/2013 0900   KETONESUR NEGATIVE 03/07/2021 1551   PROTEINUR 30 (A) 03/10/2021 1551   UROBILINOGEN 1.0 05/17/2016 0945   UROBILINOGEN 0.2 08/14/2007 0840   NITRITE NEGATIVE 03/16/2021 1551   LEUKOCYTESUR NEGATIVE 03/28/2021 1551    Radiological Exams on Admission: CT HEAD WO CONTRAST (5MM)  Result Date: 03/05/2021 CLINICAL DATA:  Altered mental status. EXAM: CT HEAD WITHOUT CONTRAST TECHNIQUE: Contiguous axial images were obtained from the base of the skull through the vertex without intravenous contrast. COMPARISON:   September 10, 2015 FINDINGS: Brain: There is mild to moderate severity cerebral atrophy with widening of the extra-axial spaces and ventricular dilatation. There are areas of decreased attenuation within the white matter tracts of the supratentorial brain, consistent with microvascular disease changes. Vascular: No hyperdense vessel or unexpected calcification. Skull: Normal. Negative for fracture or focal lesion. Sinuses/Orbits: No acute finding. Other: None. IMPRESSION: 1. Generalized cerebral atrophy. 2. No acute intracranial abnormality. Electronically Signed   By: Virgina Norfolk M.D.   On: 03/07/2021 19:32   DG Chest Port 1 View  Result Date: 04/01/2021 CLINICAL DATA:  Cough. EXAM: PORTABLE CHEST 1 VIEW COMPARISON:  Chest x-ray 09/10/2015. FINDINGS: The heart size and mediastinal contours are within normal limits. Both lungs are clear. The visualized skeletal structures are unremarkable. There are surgical clips in the left axilla. IMPRESSION: No active disease. Electronically Signed   By: Ronney Asters M.D.   On: 03/10/2021 15:01   US Abdomen Limited RUQ (LIVER/GB)  Result Date: 03/29/2021 CLINICAL DATA:  Abdominal pain EXAM: ULTRASOUND ABDOMEN LIMITED RIGHT UPPER QUADRANT COMPARISON:  By report from 03/15/2003 FINDINGS: Gallbladder: Gallbladder is well distended without evidence of wall thickening or pericholecystic fluid. Gallbladder sludge and cholelithiasis is identified similar to that seen on prior exam. Common bile duct: Diameter: 3 mm. Liver: Visualized portions of the liver are within normal limits. The acoustical window was limited. Portal vein is patent on color Doppler imaging with normal direction of blood flow towards the liver. Other: None. IMPRESSION: Cholelithiasis and gallbladder sludge without complicating factors. Somewhat limited exam.  No other focal abnormality is noted. Electronically Signed   By: Inez Catalina M.D.   On: 03/22/2021 19:01    EKG: Independently reviewed. A Fib  with RVR at 100  Assessment/Plan Active Problems:   AKI (acute kidney injury) (HCC)   Abnormal LFTs--hep c neg   Weakness   Atrial fibrillation with RVR (HCC)   DM (diabetes mellitus) ([place])   Primary hypertension   Chronic obstructive pulmonary disease (HCC)   Dementia (HCC)   Alcohol abuse   Asthma  AKI -  patient with elevated creatinine. By family report poor po intake for several days. Suspect this is prerenal azotemia. Plan Hydrate 1/2 NS at 125 cc/hr x 12 hrs then reduce rate  F/u lab at 1700 hrs  2. Abnormal LFT's - was tested in past for Hep C. Now with acute elevation LFTs, hyperammonemia, elevated T. Bili. Very tender RUQ, transverse abdomen on exam. U/S with cholelithiasis and sludge w/o evidence of acute cholecystitis. Lipase pending, acute hep panel pending. Plan Hydrate  Serial physical exam  For persistent elevation LFTs, NH3 may need MRCP  No indication for abx at this time  3. A fib with RVR - f/u EKG shows sinus tachycardia. Hemodynamically stable. No prior h/o A Fib. Plan Diltiazem 60 mg tid  Tele admit  4. HTN- Toprol XL on med list but patient not taking Plan Diltiazem as above  5. DM - on no medications. NO recent A1C. Glucose 263 Plan A1C  SS  6. Dementia by report. Picture complicated by hyperammonemia Plan Observation with repeat exams  7. Alcohol abuse - per family heavy drinker Plan Adult alcohol withdrawal protocol.    DVT prophylaxis: lovenox  C ode Status: full code  Family Communication: spoke with patient's sister. EDP spoke with daughter  Disposition Plan: TBD  Consults called: None  Admission status: inpatient/tele    Adella Hare MD [facility] Pager [phone]  If 7PM-7AM, please contact night-coverage [email or web address] Password [email or web address]  04/04/2021, 1:36 AM

## 2021-04-04 NOTE — ED Notes (Signed)
Notified Jeannette Corpus, APP about patient choking on own spit and how her oxygen saturation dropped. Patients oxygen saturation 95% on 2L [place] at this moment. Rattle sounds still present.

## 2021-04-04 NOTE — ED Notes (Signed)
Patients linens changed, new brief and purewick applied.

## 2021-04-04 NOTE — ED Notes (Signed)
Per Cruzita Lederer, MD, if pt can tolerate PO enema can be d/c

## 2021-04-04 NOTE — ED Notes (Signed)
Patient had 1 bowel movement. Clean purewick reapplied.

## 2021-04-04 NOTE — Progress Notes (Signed)
Called to the bedside by Rapid response RN with concerns for severe hypotension. On asseessment, pt with SBP in the 40's. Unable to obtain a manual and eventually able to gain one on her lower extremity. Pt obtunded, tachycardiac, tachypneic, but maintaining sats. While at bedside, an episode of emesis was noted and pt clearly aspirating. Pt transferred to ICU, started on neo, PCCM called for intubation and further management.   Aspiration into airway - ABG stat -Chest xray stat - PCCM consulted  Hypotension -CBC, LA - 1L Fluid bolus - Started on neo   Acute Metabolic Encephalopathy - Ammonia, CMP  Attempted several times to reach pt during course of event. Able to reach sister during [place]. Family to come to the bedside and discuss care going forward for patient.   Lovey Newcomer, NP [facility] 7p-7a [phone]

## 2021-04-04 NOTE — Progress Notes (Signed)
eLink Physician-Brief Progress Note Patient Name: [name] DOB: 1946/04/04 MRN: [id number]   Date of Service  04/04/2021  HPI/Events of Note  Called to bedside for code blue CPR ongoing Reportedly vomited on transfer to ICU and went into Vtach arrest CBG 50  eICU Interventions  CRNA called to intubate Bedside CCM team at bedside. CPR still ongoing.     Intervention Category Major Interventions: Code management / supervision Evaluation Type: New Patient Evaluation  Judd Lien 04/04/2021, 11:39 PM

## 2021-04-04 NOTE — Progress Notes (Signed)
Patient seen and examined this morning, admitted overnight, H&P reviewed and agree with the assessment and plan.  75 year old female with history of breast cancer, dementia, anxiety, heavy alcohol use comes into the hospital with more lethargy, poor p.o. intake over the last several days as well as incontinence.  She is quite lethargic on admission.  Principal problem Acute kidney injury-likely prerenal in the setting of poor p.o. intake.  Continue fluids  Active problems Acute pancreatitis-lipase in the 1400s, she has abdominal pain on palpation.  Continue fluids, make n.p.o. except medications and ice chips.  Acute alcoholic pancreatitis-Madrey's discriminant score less than 32, no need for steroids.  Continue to monitor LFTs  A. fib with RVR-rate controlled with diltiazem.  Obtain a 2D echo.  Not a candidate for anticoagulation given significant EtOH  Essential hypertension-currently on diltiazem  Elevated ammonia-in the setting of liver dysfunction due to alcohol use.  Continue lactulose  Acute metabolic encephalopathy, underlying dementia-I will discuss with family later on today to see our degree of dementia, currently quite confused and lethargic  Type 2 diabetes mellitus-continue sliding scale  Alcohol abuse-will need counseling when more awake  Shakeel Disney M. Cruzita Lederer, MD, PhD [facility]  Between 7 am - 7 pm you can contact me via Amion (for emergencies) or [place] (non urgent matters).  I am not available 7 pm - 7 am, please contact night coverage MD/APP via Amion

## 2021-04-04 NOTE — Evaluation (Signed)
Clinical/Bedside Swallow Evaluation Patient Details  Name: [name] MRN: [id number] Date of Birth: 1946-06-25  Today's Date: 04/04/2021 Time: SLP Start Time (ACUTE ONLY): 1250 SLP Stop Time (ACUTE ONLY): 1304 SLP Time Calculation (min) (ACUTE ONLY): 14 min  Past Medical History:  Past Medical History:  Diagnosis Date   Allergy    Anxiety    Arthritis    "was in my hips"   Asthma    Breast cancer ([place])    S/P left mastectomy 2015   Dementia ([place])    Depression    Hypertension    Hypertriglyceridemia    Migraine 1980's   Past Surgical History:  Past Surgical History:  Procedure Laterality Date   ABDOMINAL HYSTERECTOMY  1981   "fibroids"   ANKLE FRACTURE SURGERY Left 03/15/2000   with hardware   APPLICATION OF WOUND VAC  01/17/2014   w/chest wall debridement   APPLICATION OF WOUND VAC Left 01/17/2014   Procedure: PLACEMENT OF VAC DRESSING;  Surgeon: Merrie Roof, MD;  Location: [place];  Service: General;  Laterality: Left;   BREAST SURGERY Left    removed due to BCA   COLONOSCOPY     JOINT REPLACEMENT     MASTECTOMY COMPLETE / SIMPLE W/ SENTINEL NODE BIOPSY Left    MASTECTOMY W/ SENTINEL NODE BIOPSY Left 12/20/2013   Procedure: MASTECTOMY WITH SENTINEL LYMPH NODE BIOPSY;  Surgeon: Merrie Roof, MD;  Location: [place];  Service: General;  Laterality: Left;   ORIF SHOULDER FRACTURE Left [place] Right    TOTAL HIP ARTHROPLASTY Right    TOTAL HIP ARTHROPLASTY Left    TUBAL LIGATION  1973   WOUND DEBRIDEMENT Left 01/17/2014   Procedure: DEBRIDEMENT OF CHEST WALL;  Surgeon: Luella Cook III, MD;  Location: [place];  Service: General;  Laterality: Left;   HPI:  75 year old female with history of breast cancer, dementia, anxiety, heavy alcohol use comes into the hospital with more lethargy, poor p.o. intake over the last several days as well as incontinence. CXR and Head CT were negative for acute abnormality.    Assessment / Plan / Recommendation   Clinical Impression  Pt was seen for a bedside swallow evaluation and presents with oral dysphagia, likely secondary to cognition and missing dentition.  Pt was lethargic throughout this evaluation, but she participated well given cues.  Pt was seen with trials of thin liquid, puree, and finely chopped solids.  Pt required an initial siphoned straw sip to help initiate independent drinking from straw, but no overt s/sx of aspiration were observed across >3oz.  Pt exhibited suspected prolonged AP transit of puree and prolonged mastication of finely chopped solids with moderate oral residue noted.  No overt s/sx of aspiration observed with solids.  Pt was noted to have missing dentition on the bottom, and she was unable to state if she had bottom dentures or not.  Pt benefited from cues throughout this evaluation to help with functional PO intake secondary to baseline cognitive deficits.   Recommend Dysphagia 1 (puree) solids and thin liquids with medication administered crushed in puree.  SLP will f/u to monitor diet tolerance and advance as able.  SLP Visit Diagnosis: Dysphagia, oral phase (R13.11)    Aspiration Risk  Mild aspiration risk    Diet Recommendation Thin liquid;Dysphagia 1 (Puree)   Liquid Administration via: Straw Medication Administration: Crushed with puree Supervision: Staff to assist with self feeding;Full supervision/cueing for compensatory strategies Compensations: Minimize environmental  distractions;Slow rate;Small sips/bites    Other  Recommendations Oral Care Recommendations: Oral care BID;Staff/trained caregiver to provide oral care    Recommendations for follow up therapy are one component of a multi-disciplinary discharge planning process, led by the attending physician.  Recommendations may be updated based on patient status, additional functional criteria and insurance authorization.  Follow up Recommendations 24 hour supervision/assistance      Frequency and  Duration min 2x/week  2 weeks       Prognosis Prognosis for Safe Diet Advancement: Fair Barriers to Reach Goals: Cognitive deficits      Swallow Study   General HPI: 75 year old female with history of breast cancer, dementia, anxiety, heavy alcohol use comes into the hospital with more lethargy, poor p.o. intake over the last several days as well as incontinence. CXR and Head CT were negative for acute abnormality. Type of Study: Bedside Swallow Evaluation Previous Swallow Assessment: N/A Diet Prior to this Study: NPO Temperature Spikes Noted: No Respiratory Status: Room air History of Recent Intubation: No Behavior/Cognition: Lethargic/Drowsy;Requires cueing Oral Cavity Assessment: Within Functional Limits Oral Care Completed by SLP: No Oral Cavity - Dentition: Missing dentition (dentition top, missing dentition bottom) Self-Feeding Abilities: Needs assist Patient Positioning: Upright in bed Baseline Vocal Quality: Normal Volitional Cough: Cognitively unable to elicit Volitional Swallow: Unable to elicit    Oral/Motor/Sensory Function Overall Oral Motor/Sensory Function: Other (comment) (unable to assess secondary to inability to follow commands)   Ice Chips Ice chips: Not tested   Thin Liquid Thin Liquid: Within functional limits Presentation: Straw    Nectar Thick Nectar Thick Liquid: Not tested   Honey Thick Honey Thick Liquid: Not tested   Puree Puree: Impaired Presentation: Spoon Oral Phase Functional Implications: Prolonged oral transit   Solid     Solid: Impaired Presentation: Spoon Oral Phase Impairments: Impaired mastication Oral Phase Functional Implications: Prolonged oral transit;Oral residue     Colin Mulders., M.S., CCC-SLP Acute Rehabilitation Services Office: [phone]  [name] 04/04/2021,1:13 PM

## 2021-04-04 DEATH — deceased

## 2021-04-05 ENCOUNTER — Inpatient Hospital Stay (HOSPITAL_COMMUNITY): Payer: Medicare Other

## 2021-04-05 DIAGNOSIS — K759 Inflammatory liver disease, unspecified: Secondary | ICD-10-CM

## 2021-04-05 DIAGNOSIS — T17908A Unspecified foreign body in respiratory tract, part unspecified causing other injury, initial encounter: Secondary | ICD-10-CM

## 2021-04-05 DIAGNOSIS — I469 Cardiac arrest, cause unspecified: Secondary | ICD-10-CM

## 2021-04-05 LAB — CBC WITH DIFFERENTIAL/PLATELET
Abs Immature Granulocytes: 0.05 10*3/uL (ref 0.00–0.07)
Basophils Absolute: 0 10*3/uL (ref 0.0–0.1)
Basophils Relative: 0 %
Eosinophils Absolute: 0 10*3/uL (ref 0.0–0.5)
Eosinophils Relative: 0 %
HCT: 23.7 % — ABNORMAL LOW (ref 36.0–46.0)
Hemoglobin: 7.8 g/dL — ABNORMAL LOW (ref 12.0–15.0)
Immature Granulocytes: 2 %
Lymphocytes Relative: 47 %
Lymphs Abs: 1.5 10*3/uL (ref 0.7–4.0)
MCH: 37 pg — ABNORMAL HIGH (ref 26.0–34.0)
MCHC: 32.9 g/dL (ref 30.0–36.0)
MCV: 112.3 fL — ABNORMAL HIGH (ref 80.0–100.0)
Monocytes Absolute: 0.3 10*3/uL (ref 0.1–1.0)
Monocytes Relative: 8 %
Neutro Abs: 1.4 10*3/uL — ABNORMAL LOW (ref 1.7–7.7)
Neutrophils Relative %: 43 %
Platelets: 57 10*3/uL — ABNORMAL LOW (ref 150–400)
RBC: 2.11 MIL/uL — ABNORMAL LOW (ref 3.87–5.11)
RDW: 21.1 % — ABNORMAL HIGH (ref 11.5–15.5)
WBC: 3.3 10*3/uL — ABNORMAL LOW (ref 4.0–10.5)
nRBC: 28.7 % — ABNORMAL HIGH (ref 0.0–0.2)

## 2021-04-05 LAB — BLOOD CULTURE ID PANEL (REFLEXED) - BCID2
A.calcoaceticus-baumannii: NOT DETECTED
Bacteroides fragilis: NOT DETECTED
Candida albicans: NOT DETECTED
Candida auris: NOT DETECTED
Candida glabrata: NOT DETECTED
Candida krusei: NOT DETECTED
Candida parapsilosis: NOT DETECTED
Candida tropicalis: NOT DETECTED
Cryptococcus neoformans/gattii: NOT DETECTED
Enterobacter cloacae complex: NOT DETECTED
Enterobacterales: NOT DETECTED
Enterococcus Faecium: NOT DETECTED
Enterococcus faecalis: NOT DETECTED
Escherichia coli: NOT DETECTED
Haemophilus influenzae: NOT DETECTED
Klebsiella aerogenes: NOT DETECTED
Klebsiella oxytoca: NOT DETECTED
Klebsiella pneumoniae: NOT DETECTED
Listeria monocytogenes: NOT DETECTED
Neisseria meningitidis: NOT DETECTED
Proteus species: NOT DETECTED
Pseudomonas aeruginosa: NOT DETECTED
Salmonella species: NOT DETECTED
Serratia marcescens: NOT DETECTED
Staphylococcus aureus (BCID): NOT DETECTED
Staphylococcus epidermidis: NOT DETECTED
Staphylococcus lugdunensis: NOT DETECTED
Stenotrophomonas maltophilia: NOT DETECTED
Streptococcus agalactiae: NOT DETECTED
Streptococcus pneumoniae: NOT DETECTED
Streptococcus pyogenes: NOT DETECTED
Streptococcus species: NOT DETECTED

## 2021-04-05 LAB — BLOOD GAS, ARTERIAL
Acid-Base Excess: 0.1 mmol/L (ref 0.0–2.0)
Bicarbonate: 27.3 mmol/L (ref 20.0–28.0)
FIO2: 100
O2 Saturation: 88.6 %
PEEP: 8 cmH2O
Patient temperature: 98.6
RATE: 16 resp/min
pCO2 arterial: 63.6 mmHg — ABNORMAL HIGH (ref 32.0–48.0)
pH, Arterial: 7.256 — ABNORMAL LOW (ref 7.350–7.450)
pO2, Arterial: 75.7 mmHg — ABNORMAL LOW (ref 83.0–108.0)

## 2021-04-05 LAB — COMPREHENSIVE METABOLIC PANEL
ALT: 59 U/L — ABNORMAL HIGH (ref 0–44)
AST: 127 U/L — ABNORMAL HIGH (ref 15–41)
Albumin: 0.8 g/dL — ABNORMAL LOW (ref 3.5–5.0)
Alkaline Phosphatase: 124 U/L (ref 38–126)
Anion gap: 7 (ref 5–15)
BUN: 27 mg/dL — ABNORMAL HIGH (ref 8–23)
CO2: 28 mmol/L (ref 22–32)
Calcium: 10.8 mg/dL — ABNORMAL HIGH (ref 8.9–10.3)
Chloride: 103 mmol/L (ref 98–111)
Creatinine, Ser: 2.2 mg/dL — ABNORMAL HIGH (ref 0.44–1.00)
GFR, Estimated: 23 mL/min — ABNORMAL LOW (ref >60)
Glucose, Bld: 544 mg/dL (ref 70–99)
Potassium: 4 mmol/L (ref 3.5–5.1)
Sodium: 138 mmol/L (ref 135–145)
Total Bilirubin: 1.9 mg/dL — ABNORMAL HIGH (ref 0.3–1.2)
Total Protein: <3 g/dL — ABNORMAL LOW (ref 6.5–8.1)

## 2021-04-05 LAB — LACTIC ACID, PLASMA: Lactic Acid, Venous: 7.1 mmol/L (ref 0.5–1.9)

## 2021-04-05 LAB — TYPE AND SCREEN
ABO/RH(D): O POS
Antibody Screen: NEGATIVE

## 2021-04-05 LAB — GLUCOSE, CAPILLARY: Glucose-Capillary: 192 mg/dL — ABNORMAL HIGH (ref 70–99)

## 2021-04-05 LAB — AMMONIA: Ammonia: 58 umol/L — ABNORMAL HIGH (ref 9–35)

## 2021-04-05 MED ORDER — ONDANSETRON HCL 4 MG/2ML IJ SOLN: 4.0000 mg | Freq: Once | INTRAMUSCULAR | Status: DC

## 2021-04-05 MED ORDER — ONDANSETRON HCL 4 MG/2ML IJ SOLN
INTRAMUSCULAR | Status: AC
  Filled 2021-04-05: qty 2

## 2021-04-05 MED FILL — Medication: Qty: 1 | Status: AC

## 2021-04-06 LAB — URINE CULTURE: Culture: >=100000 — AB

## 2021-04-06 LAB — CULTURE, BLOOD (ROUTINE X 2): Special Requests: ADEQUATE

## 2021-04-08 LAB — CULTURE, BLOOD (ROUTINE X 2): Culture: NO GROWTH

## 2021-05-05 NOTE — ED Provider Notes (Signed)
[facility]  Department of Emergency Medicine   Code Blue CONSULT NOTE  Chief Complaint: Cardiac arrest/unresponsive   Level V Caveat: Unresponsive  History of present illness: I was contacted by the hospital for a CODE BLUE cardiac arrest upstairs and presented to the patient's bedside.  Admitted for Diabetes   ROS: Unable to obtain, Level V caveat  Scheduled Meds:  enoxaparin (LOVENOX) injection  30 mg Subcutaneous Q24H   EPINEPHrine       EPINEPHrine       folic acid  1 mg Oral Daily   insulin aspart  0-15 Units Subcutaneous TID WC   lactulose  30 g Oral BID   lactulose  300 mL Rectal Once   multivitamin with minerals  1 tablet Oral Daily   ondansetron       senna  1 tablet Oral BID   sodium bicarbonate       sodium bicarbonate       thiamine  100 mg Oral Daily   Or   thiamine  100 mg Intravenous Daily   Continuous Infusions:  sodium chloride     phenylephrine (NEO-SYNEPHRINE) Adult infusion     PRN Meds:. Past Medical History:  Diagnosis Date   Allergy    Anxiety    Arthritis    "was in my hips"   Asthma    Breast cancer ([place])    S/P left mastectomy 2015   Dementia ([place])    Depression    Hypertension    Hypertriglyceridemia    Migraine 1980's   Past Surgical History:  Procedure Laterality Date   ABDOMINAL HYSTERECTOMY  1981   "fibroids"   ANKLE FRACTURE SURGERY Left 03/15/2000   with hardware   APPLICATION OF WOUND VAC  01/17/2014   w/chest wall debridement   APPLICATION OF WOUND VAC Left 01/17/2014   Procedure: PLACEMENT OF VAC DRESSING;  Surgeon: Merrie Roof, MD;  Location: [facility];  Service: General;  Laterality: Left;   BREAST SURGERY Left    removed due to BCA   COLONOSCOPY     JOINT REPLACEMENT     MASTECTOMY COMPLETE / SIMPLE W/ SENTINEL NODE BIOPSY Left    MASTECTOMY W/ SENTINEL NODE BIOPSY Left 12/20/2013   Procedure: MASTECTOMY WITH SENTINEL LYMPH NODE BIOPSY;  Surgeon: Merrie Roof, MD;  Location: [facility];  Service:  General;  Laterality: Left;   ORIF SHOULDER FRACTURE Left [place] Right    TOTAL HIP ARTHROPLASTY Right    TOTAL HIP ARTHROPLASTY Left    TUBAL LIGATION  1973   WOUND DEBRIDEMENT Left 01/17/2014   Procedure: DEBRIDEMENT OF CHEST WALL;  Surgeon: Merrie Roof, MD;  Location: [facility];  Service: General;  Laterality: Left;   Social History   Socioeconomic History   Marital status: Divorced    Spouse name: n/a   Number of children: 1   Years of education: 12   Highest education level: Not on file  Occupational History   Occupation: retired    Comment: Network engineer  Tobacco Use   Smoking status: Former    Packs/day: 0.50    Years: 40.00    Pack years: 20.00    Types: Cigarettes    Quit date: 11/11/2000    Years since quitting: 20.4   Smokeless tobacco: Never  Substance and Sexual Activity   Alcohol use: Yes    Comment: occasionally   Drug use: Yes    Types: Marijuana  Comment: "last smoked marijuana in ~ 2010; recreational user only"   Sexual activity: Never  Other Topics Concern   Not on file  Social History Narrative   Her daughter and granddaughter live with her.   Social Determinants of Health   Financial Resource Strain: Not on file  Food Insecurity: Not on file  Transportation Needs: Not on file  Physical Activity: Not on file  Stress: Not on file  Social Connections: Not on file  Intimate Partner Violence: Not on file   Allergies  Allergen Reactions   Ace Inhibitors Other (See Comments)    Acute renal failure   Angiotensin Receptor Blockers Other (See Comments)    Acute Renal Failure    Last set of Vital Signs (not current) Vitals:   04/04/21 2029 04/04/21 2330  BP: 99/77   Pulse: (!) 103   Resp:    Temp: (!) 97.5 F (36.4 C)   SpO2:  (!) 80%      Physical Exam  Gen: unresponsive Cardiovascular: pulseless  Resp: apneic. Breath sounds equal bilaterally with bagging  Abd: nondistended  Neuro: GCS 3,  unresponsive to pain  HEENT: No blood in posterior pharynx, gag reflex absent  Neck: No crepitus  Musculoskeletal: No deformity  Skin: warm  MDM Reviewed: previous chart and vitals Reviewed previous: labs Interpretation: labs and x-ray (low calcium) Total time providing critical care: 30-74 minutes (code blue). This excludes time spent performing separately reportable procedures and services. Consults: critical care   CRITICAL CARE Performed by: Mossie Gilder K Penelope Fittro-Rasch Total critical care time: 30 minutes  Critical care time was exclusive of separately billable procedures and treating other patients. Critical care was necessary to treat or prevent imminent or life-threatening deterioration. Critical care was time spent personally by me on the following activities: development of treatment plan with patient and/or surrogate as well as nursing, discussions with consultants, evaluation of patient's response to treatment, examination of patient, obtaining history from patient or surrogate, ordering and performing treatments and interventions, ordering and review of laboratory studies, ordering and review of radiographic studies, pulse oximetry and re-evaluation of patient's condition.  Cardiopulmonary Resuscitation (CPR) Procedure Note  Directed/Performed by: Jaylyn Iyer K Zilla Shartzer-Rasch I personally directed ancillary staff and/or performed CPR in an effort to regain return of spontaneous circulation and to maintain cardiac, neuro and systemic perfusion.    Medical Decision making  Aspiration PNA and pancreatitis   Assessment and Plan  To CCM at the bedside    Adaiah Jaskot, MD 04/24/2021 2297

## 2021-05-05 NOTE — Progress Notes (Signed)
Pt pronounced deceased by 2 RNs (Genell G & Saige Canton H)  No heartbeat/ respirations auscultated.  TOD verified by [facility] @ [phone]

## 2021-05-05 NOTE — Progress Notes (Addendum)
PCCM note  I met with extended family including POA, her sister Lady Deutscher Discussed her clinical course and concern for multiorgan failure due to EtOH pancreatitis, GI bleed, liver failure Her family clearly told me that she would not want to be supported like this and have requested DNR status  Unfortunately the patient continued to deteriorate and went into another PEA arrest shortly after the family discussion.  She was not resuscitated in accordance with the wishes of the family. She passed away at 12:51 AM with family at bedside.  Marshell Garfinkel MD [facility] See Amion for pager  If no response to pager , please call [phone] until 7pm After 7:00 pm call Elink  [phone] 04-09-2021, 1:08 AM

## 2021-05-05 NOTE — Death Summary Note (Signed)
Death Summary  Vondra Aldredge Delmore GYF:[id number] DOB: 1945-12-06 DOA: 07-Apr-2021  PCP: Jordan Hawks, FNP  Admit date: 2021/04/07 Date of Death: 2021-04-09 Time of Death: 12:51 am Notification: Jordan Hawks, [address] notified of death of Apr 09, 2021   History of present illness:  75 year old female with history of breast cancer, dementia, heavy alcohol use, hypertension came into the hospital and was admitted with poor p.o. intake, weakness, and increased confusion over the past 3 to 4 days.  She was diagnosed with an admission with acute pancreatitis with a lipase in the [id number], acute alcoholic hepatitis with hyperammonemia and concern for hepatic encephalopathy, acute kidney injury as well as A. fib with RVR.  She was placed on fluid resuscitation for acute pancreatitis, n.p.o., given lactulose for her encephalopathy, rate controlling A. fib with RVR and overall comprehensive supportive treatment.  Her mental status initially started to improve, passed speech evaluation and was cleared for dysphagia 1 diet (she was maintained n.p.o. due to her acute pancreatitis though).  During the night she became increasingly unresponsive and apparently had coffee-ground emesis, hypotension, coded and eventually transferred to the ICU.  Family was contacted and PCCM discussed with extended family including the POA, Sister [name] about concerns for multiorgan failure due to alcoholic pancreatitis as well as hepatitis, GI bleed, liver failure Millie agreed that she did not want to be supported like this, have requested DNR status.  Unfortunately patient continued to deteriorate and had another PEA arrest and passed away.  Final Diagnoses:   Acute alcoholic pancreatitis Acute alcoholic hepatitis with hyperbilirubinemia Hepatic encephalopathy with hyperammonemia New onset A. fib with RVR Concern for upper GI bleed with coffee-ground emesis Aspiration of gastric contents, severe ARDS Acute respiratory acidosis Acute  hypoxic respiratory failure Elevated liver enzymes Acute blood loss anemia Lactic acidosis Thrombocytopenia Leukopenia    The results of significant diagnostics from this hospitalization (including imaging, microbiology, ancillary and laboratory) are listed below for reference.    Significant Diagnostic Studies: DG Chest 1 View  Result Date: 04/04/2021 CLINICAL DATA:  Recent emesis with possible aspiration, initial encounter EXAM: CHEST  1 VIEW COMPARISON:  Film from earlier in the same day. FINDINGS: Cardiac shadow is within normal limits. Lungs are clear bilaterally. No focal infiltrate or effusion is noted. No bony abnormality is seen. IMPRESSION: No active disease. Electronically Signed   By: Inez Catalina M.D.   On: 04/04/2021 23:40   DG Chest 1 View  Result Date: 04/04/2021 CLINICAL DATA:  Recent aspiration EXAM: CHEST  1 VIEW COMPARISON:  04/07/21 FINDINGS: Artifact from EKG leads. Normal heart size and mediastinal contours. No acute infiltrate or edema. No effusion or pneumothorax. No acute osseous findings. Postoperative left axilla. IMPRESSION: Negative portable chest. Electronically Signed   By: Jorje Guild M.D.   On: 04/04/2021 05:02   CT HEAD WO CONTRAST (5MM)  Result Date: 04-07-2021 CLINICAL DATA:  Altered mental status. EXAM: CT HEAD WITHOUT CONTRAST TECHNIQUE: Contiguous axial images were obtained from the base of the skull through the vertex without intravenous contrast. COMPARISON:  September 10, 2015 FINDINGS: Brain: There is mild to moderate severity cerebral atrophy with widening of the extra-axial spaces and ventricular dilatation. There are areas of decreased attenuation within the white matter tracts of the supratentorial brain, consistent with microvascular disease changes. Vascular: No hyperdense vessel or unexpected calcification. Skull: Normal. Negative for fracture or focal lesion. Sinuses/Orbits: No acute finding. Other: None. IMPRESSION: 1. Generalized cerebral  atrophy. 2. No acute intracranial abnormality. Electronically Signed   By: Hoover Browns  Houston M.D.   On: 03/25/2021 19:32   DG CHEST PORT 1 VIEW  Result Date: 04-21-2021 CLINICAL DATA:  Weakness, status post intubation EXAM: PORTABLE CHEST 1 VIEW COMPARISON:  04/04/2021 FINDINGS: Endotracheal tube is now noted in satisfactory position. Gastric catheter is noted with the tip in the distal esophagus in the proximal side port in the mid esophagus. This should be advanced several cm deeper into the stomach. Right jugular line is noted in place. No pneumothorax is seen. No focal infiltrate is noted. IMPRESSION: Tubes and lines as described above. The gastric catheter should be advanced deeper into the stomach as it now lies in the mid to distal esophagus. Electronically Signed   By: Inez Catalina M.D.   On: 04/21/21 00:53   DG Chest Port 1 View  Result Date: 03/24/2021 CLINICAL DATA:  Cough. EXAM: PORTABLE CHEST 1 VIEW COMPARISON:  Chest x-ray 09/10/2015. FINDINGS: The heart size and mediastinal contours are within normal limits. Both lungs are clear. The visualized skeletal structures are unremarkable. There are surgical clips in the left axilla. IMPRESSION: No active disease. Electronically Signed   By: Ronney Asters M.D.   On: 03/28/2021 15:01   ECHOCARDIOGRAM COMPLETE  Result Date: 04/04/2021    ECHOCARDIOGRAM REPORT   Patient Name:   ORISSA ARREAGA Helbert Date of Exam: 04/04/2021 Medical Rec #:  [id number]      Height:       64.0 in Accession #:    [id number]     Weight:       150.0 lb Date of Birth:  Oct 27, 1945      BSA:          1.731 m Patient Age:    75 years       BP:           162/73 mmHg Patient Gender: F              HR:           105 bpm. Exam Location:  Inpatient Procedure: 2D Echo, Color Doppler, Cardiac Doppler and Intracardiac            Opacification Agent Indications:    R94.31 Abnormal EKG  History:        Patient has prior history of Echocardiogram examinations, most                 recent  03/02/2020. COPD, Arrythmias:Atrial Fibrillation; Risk                 Factors:Hypertension, Diabetes and ETOH Abuse.  Sonographer:    Raquel Sarna Senior RDCS Referring Phys: [place]  Sonographer Comments: Very poor echo windows even with contrast. Unable to arouse patient for repositioning or breath holds. IMPRESSIONS  1. Left ventricular ejection fraction, by estimation, is 60 to 65%. The left ventricle has normal function. The left ventricle has no regional wall motion abnormalities. Left ventricular diastolic function could not be evaluated.  2. Right ventricular systolic function is normal. The right ventricular size is normal. Tricuspid regurgitation signal is inadequate for assessing PA pressure.  3. The mitral valve is normal in structure. No evidence of mitral valve regurgitation. No evidence of mitral stenosis.  4. The aortic valve was not well visualized. Aortic valve regurgitation is not visualized.  5. The inferior vena cava is normal in size with <50% respiratory variability, suggesting right atrial pressure of 8 mmHg. FINDINGS  Left Ventricle: Left ventricular ejection fraction, by estimation, is 60 to 65%. The left  ventricle has normal function. The left ventricle has no regional wall motion abnormalities. The left ventricular internal cavity size was normal in size. There is  no left ventricular hypertrophy. Left ventricular diastolic function could not be evaluated. Right Ventricle: The right ventricular size is normal. No increase in right ventricular wall thickness. Right ventricular systolic function is normal. Tricuspid regurgitation signal is inadequate for assessing PA pressure. Left Atrium: Left atrial size was normal in size. Right Atrium: Right atrial size was normal in size. Pericardium: There is no evidence of pericardial effusion. Mitral Valve: The mitral valve is normal in structure. No evidence of mitral valve regurgitation. No evidence of mitral valve stenosis. Tricuspid  Valve: The tricuspid valve is not well visualized. Tricuspid valve regurgitation is not demonstrated. No evidence of tricuspid stenosis. Aortic Valve: The aortic valve was not well visualized. Aortic valve regurgitation is not visualized. Pulmonic Valve: The pulmonic valve was not well visualized. Pulmonic valve regurgitation is not visualized. No evidence of pulmonic stenosis. Aorta: The aortic root is normal in size and structure. Venous: The inferior vena cava is normal in size with less than 50% respiratory variability, suggesting right atrial pressure of 8 mmHg. IAS/Shunts: No atrial level shunt detected by color flow Doppler.  LEFT VENTRICLE PLAX 2D LVOT diam:     1.90 cm LV SV:         37 LV SV Index:   21 LVOT Area:     2.84 cm  RIGHT VENTRICLE RV S prime:     18.00 cm/s TAPSE (M-mode): 1.7 cm LEFT ATRIUM           Index       RIGHT ATRIUM           Index LA Vol (A4C): 36.5 ml 21.08 ml/m RA Area:     14.30 cm                                   RA Volume:   35.10 ml  20.27 ml/m  AORTIC VALVE LVOT Vmax:   72.50 cm/s LVOT Vmean:  58.700 cm/s LVOT VTI:    0.131 m  SHUNTS Systemic VTI:  0.13 m Systemic Diam: 1.90 cm Fransico Him MD Electronically signed by Fransico Him MD Signature Date/Time: 04/04/2021/1:32:31 PM    Final    US Abdomen Limited RUQ (LIVER/GB)  Result Date: 03/20/2021 CLINICAL DATA:  Abdominal pain EXAM: ULTRASOUND ABDOMEN LIMITED RIGHT UPPER QUADRANT COMPARISON:  By report from 03/15/2003 FINDINGS: Gallbladder: Gallbladder is well distended without evidence of wall thickening or pericholecystic fluid. Gallbladder sludge and cholelithiasis is identified similar to that seen on prior exam. Common bile duct: Diameter: 3 mm. Liver: Visualized portions of the liver are within normal limits. The acoustical window was limited. Portal vein is patent on color Doppler imaging with normal direction of blood flow towards the liver. Other: None. IMPRESSION: Cholelithiasis and gallbladder sludge without  complicating factors. Somewhat limited exam.  No other focal abnormality is noted. Electronically Signed   By: Inez Catalina M.D.   On: 03/17/2021 19:01    Microbiology: Recent Results (from the past 240 hour(s))  Resp Panel by RT-PCR (Flu A&B, Covid) Nasopharyngeal Swab     Status: None   Collection Time: 04/02/2021  3:51 PM   Specimen: Nasopharyngeal Swab; Nasopharyngeal(NP) swabs in vial transport medium  Result Value Ref Range Status   SARS Coronavirus 2 by RT PCR NEGATIVE NEGATIVE Final  Comment: (NOTE) SARS-CoV-2 target nucleic acids are NOT DETECTED.  The SARS-CoV-2 RNA is generally detectable in upper respiratory specimens during the acute phase of infection. The lowest concentration of SARS-CoV-2 viral copies this assay can detect is 138 copies/mL. A negative result does not preclude SARS-Cov-2 infection and should not be used as the sole basis for treatment or other patient management decisions. A negative result may occur with  improper specimen collection/handling, submission of specimen other than nasopharyngeal swab, presence of viral mutation(s) within the areas targeted by this assay, and inadequate number of viral copies(<138 copies/mL). A negative result must be combined with clinical observations, patient history, and epidemiological information. The expected result is Negative.  Fact Sheet for Patients:  EntrepreneurPulse.com.au  Fact Sheet for Healthcare Providers:  IncredibleEmployment.be  This test is no t yet approved or cleared by the [place] FDA and  has been authorized for detection and/or diagnosis of SARS-CoV-2 by FDA under an Emergency Use Authorization (EUA). This EUA will remain  in effect (meaning this test can be used) for the duration of the COVID-19 declaration under Section 564(b)(1) of the Act, 21 U.S.C.section 360bbb-3(b)(1), unless the authorization is terminated  or revoked sooner.        Influenza A by PCR NEGATIVE NEGATIVE Final   Influenza B by PCR NEGATIVE NEGATIVE Final    Comment: (NOTE) The Xpert Xpress SARS-CoV-2/FLU/RSV plus assay is intended as an aid in the diagnosis of influenza from Nasopharyngeal swab specimens and should not be used as a sole basis for treatment. Nasal washings and aspirates are unacceptable for Xpert Xpress SARS-CoV-2/FLU/RSV testing.  Fact Sheet for Patients: EntrepreneurPulse.com.au  Fact Sheet for Healthcare Providers: IncredibleEmployment.be  This test is not yet approved or cleared by the [place] FDA and has been authorized for detection and/or diagnosis of SARS-CoV-2 by FDA under an Emergency Use Authorization (EUA). This EUA will remain in effect (meaning this test can be used) for the duration of the COVID-19 declaration under Section 564(b)(1) of the Act, 21 U.S.C. section 360bbb-3(b)(1), unless the authorization is terminated or revoked.  Performed at [facility], [place] [address], [place], [place] [address]   Culture, blood (Routine X 2) w Reflex to ID Panel     Status: None (Preliminary result)   Collection Time: 03/05/2021  3:51 PM   Specimen: BLOOD  Result Value Ref Range Status   Specimen Description   Final    BLOOD RIGHT ANTECUBITAL Performed at [place] [address], [place], [place] [address]    Special Requests   Final    BOTTLES DRAWN AEROBIC AND ANAEROBIC Blood Culture results may not be optimal due to an inadequate volume of blood received in culture bottles Performed at [facility] [address], [place], [place] [address]    Culture   Final    NO GROWTH 2 DAYS Performed at [facility] [address] [place], [place] [address]    Report Status PENDING  Incomplete  Urine Culture     Status: Abnormal (Preliminary result)   Collection Time: 03/07/2021  3:51 PM   Specimen: Urine, Clean Catch   Result Value Ref Range Status   Specimen Description   Final    URINE, CLEAN CATCH Performed at [facility], [place] [address], [place], [place] [address]    Special Requests   Final    NONE Performed at [facility], [place] [address] [place], [place] [address]  Culture (A)  Final    >=100,000 COLONIES/mL ESCHERICHIA COLI SUSCEPTIBILITIES TO FOLLOW Performed at [facility] [address], [place], [place] [address]    Report Status PENDING  Incomplete  Culture, blood (Routine X 2) w Reflex to ID Panel     Status: None (Preliminary result)   Collection Time: 04/04/21  1:22 AM   Specimen: BLOOD  Result Value Ref Range Status   Specimen Description   Final    BLOOD BLOOD LEFT FOREARM Performed at [facility] [address], [place], [place] [address]    Special Requests   Final    BOTTLES DRAWN AEROBIC AND ANAEROBIC Blood Culture adequate volume Performed at [facility] [address], [place], [place] [address]    Culture  Setup Time   Final    GRAM POSITIVE COCCI IN CLUSTERS IN BOTH AEROBIC AND ANAEROBIC BOTTLES PATIENT DISCHARGED OR EXPIRED PT EXPIRED    Culture   Final    GRAM POSITIVE COCCI TOO YOUNG TO READ Performed at [facility], [place] [address] [place], [place] [address]    Report Status PENDING  Incomplete  Blood Culture ID Panel (Reflexed)     Status: Abnormal   Collection Time: 04/04/21  1:22 AM  Result Value Ref Range Status   Enterococcus faecalis NOT DETECTED NOT DETECTED Final   Enterococcus Faecium NOT DETECTED NOT DETECTED Final   Listeria monocytogenes NOT DETECTED NOT DETECTED Final   Staphylococcus species PATIENT DISCHARGED OR EXPIRED (A) NOT DETECTED Final    Comment: PT EXPIRED   Staphylococcus aureus (BCID) NOT DETECTED NOT DETECTED Final   Staphylococcus epidermidis NOT DETECTED NOT DETECTED Final   Staphylococcus lugdunensis NOT DETECTED NOT DETECTED  Final   Streptococcus species NOT DETECTED NOT DETECTED Final   Streptococcus agalactiae NOT DETECTED NOT DETECTED Final   Streptococcus pneumoniae NOT DETECTED NOT DETECTED Final   Streptococcus pyogenes NOT DETECTED NOT DETECTED Final   A.calcoaceticus-baumannii NOT DETECTED NOT DETECTED Final   Bacteroides fragilis NOT DETECTED NOT DETECTED Final   Enterobacterales NOT DETECTED NOT DETECTED Final   Enterobacter cloacae complex NOT DETECTED NOT DETECTED Final   Escherichia coli NOT DETECTED NOT DETECTED Final   Klebsiella aerogenes NOT DETECTED NOT DETECTED Final   Klebsiella oxytoca NOT DETECTED NOT DETECTED Final   Klebsiella pneumoniae NOT DETECTED NOT DETECTED Final   Proteus species NOT DETECTED NOT DETECTED Final   Salmonella species NOT DETECTED NOT DETECTED Final   Serratia marcescens NOT DETECTED NOT DETECTED Final   Haemophilus influenzae NOT DETECTED NOT DETECTED Final   Neisseria meningitidis NOT DETECTED NOT DETECTED Final   Pseudomonas aeruginosa NOT DETECTED NOT DETECTED Final   Stenotrophomonas maltophilia NOT DETECTED NOT DETECTED Final   Candida albicans NOT DETECTED NOT DETECTED Final   Candida auris NOT DETECTED NOT DETECTED Final   Candida glabrata NOT DETECTED NOT DETECTED Final   Candida krusei NOT DETECTED NOT DETECTED Final   Candida parapsilosis NOT DETECTED NOT DETECTED Final   Candida tropicalis NOT DETECTED NOT DETECTED Final   Cryptococcus neoformans/gattii NOT DETECTED NOT DETECTED Final    Comment: Performed at [facility], [place] [address] [place], [place] [address]     Labs: Basic Metabolic Panel: Recent Labs  Lab 03/17/2021 1551 04/04/21 0942 04/04/21 2355  NA 140 139 138  K 4.9 3.7 4.0  CL 98 103 103  CO2 22 23 28   GLUCOSE 263* 232* 544*  BUN 25* 29* 27*  CREATININE  2.17* 2.05* 2.20*  CALCIUM 7.9* 7.3* 10.8*   Liver Function Tests: Recent Labs  Lab 03/16/2021 1551 04/04/21 0942 04/04/21 2355  AST 204* 173* 127*  ALT 120*  104* 59*  ALKPHOS 315* 287* 124  BILITOT 5.7* 4.7* 1.9*  PROT 5.6* 5.2* <3.0*  ALBUMIN 2.5* 2.2* 0.8*   Recent Labs  Lab 04/04/21 0122  LIPASE 1,403*   Recent Labs  Lab 03/27/2021 1951 04/04/21 0942 04/04/21 2355  AMMONIA 63* 54* 58*   CBC: Recent Labs  Lab 03/27/2021 1551 04/04/21 0942 04/04/21 2355  WBC 7.9 7.0 3.3*  NEUTROABS 6.6  --  1.4*  HGB 12.4 12.9 7.8*  HCT 38.1 38.6 23.7*  MCV 112.7* 109.3* 112.3*  PLT 159 131* 57*   Cardiac Enzymes: No results for input(s): CKTOTAL, CKMB, CKMBINDEX, TROPONINI in the last 168 hours. D-Dimer No results for input(s): DDIMER in the last 72 hours. BNP: Invalid input(s): POCBNP CBG: Recent Labs  Lab 04/04/21 1652 04/04/21 2237 04/04/21 2341 04/04/21 2349 2021/04/17 0021  GLUCAP 164* 113* 50* 82 192*   Anemia work up No results for input(s): VITAMINB12, FOLATE, FERRITIN, TIBC, IRON, RETICCTPCT in the last 72 hours. Urinalysis    Component Value Date/Time   COLORURINE AMBER (A) 03/15/2021 1551   APPEARANCEUR HAZY (A) 03/23/2021 1551   LABSPEC 1.015 03/25/2021 1551   PHURINE 5.0 03/07/2021 1551   GLUCOSEU NEGATIVE 03/25/2021 1551   HGBUR SMALL (A) 03/11/2021 1551   BILIRUBINUR NEGATIVE 03/20/2021 1551   BILIRUBINUR negative 05/17/2016 0945   BILIRUBINUR neg 05/24/2013 0900   KETONESUR NEGATIVE 03/07/2021 1551   PROTEINUR 30 (A) 03/06/2021 1551   UROBILINOGEN 1.0 05/17/2016 0945   UROBILINOGEN 0.2 08/14/2007 0840   NITRITE NEGATIVE 03/21/2021 1551   LEUKOCYTESUR NEGATIVE 03/17/2021 1551   Sepsis Labs Invalid input(s): PROCALCITONIN,  WBC,  LACTICIDVEN  SIGNED:  Marzetta Board, MD  [facility] 2021-04-17, 2:17 PM Pager   If 7PM-7AM, please contact night-coverage [email or web address] Password [email or web address]

## 2021-05-05 NOTE — Procedures (Signed)
Intraosseous Needle Insertion Procedure Note    Date:04/28/21  Time:12:50 AM   Provider Performing:Nataki Mccrumb   Procedure: Insertion Intraosseous (81859)  Indication(s) Difficult access  Consent Unable to obtain consent due to emergent nature of procedure.  Anesthesia Topical only with 1% lidocaine   Timeout Verified patient identification, verified procedure, site/side was marked, verified correct patient position, special equipment/implants available, medications/allergies/relevant history reviewed, required imaging and test results available.  Procedure Description Area of needle insertion was cleaned with chlorhexidine. Intraosseous needle was placed into the right tibia. Bone marrow was aspirated and site easily flushed. The needle was secured in place and dressing applied.  Complications/Tolerance None; patient tolerated the procedure well.  EBL Minimal  Marshell Garfinkel MD [facility] See Amion for pager  If no response to pager , please call [phone] until 7pm After 7:00 pm call Elink  [phone] 04/28/2021, 12:51 AM

## 2021-05-05 NOTE — Procedures (Signed)
Central Venous Catheter Insertion Procedure Note  Dorotea Hand Rude  [id number]  11-12-1945  Date:April 27, 2021  Time:12:51 AM   Provider Performing:Kitiara Hintze   Procedure: Insertion of Non-tunneled Central Venous Catheter(36556) with US guidance (01093)   Indication(s) Difficult access  Consent Unable to obtain consent due to emergent nature of procedure.  Anesthesia Topical only with 1% lidocaine   Timeout Verified patient identification, verified procedure, site/side was marked, verified correct patient position, special equipment/implants available, medications/allergies/relevant history reviewed, required imaging and test results available.  Sterile Technique Maximal sterile technique including full sterile barrier drape, hand hygiene, sterile gown, sterile gloves, mask, hair covering, sterile ultrasound probe cover (if used).  Procedure Description Area of catheter insertion was cleaned with chlorhexidine and draped in sterile fashion.  Ultrasound was used to visualize the veins in real-time. With real-time ultrasound guidance a central venous catheter was placed into the left internal jugular vein. Nonpulsatile blood flow and easy flushing noted in all ports.  The catheter was sutured in place and sterile dressing applied.  Complications/Tolerance None; patient tolerated the procedure well. Chest X-ray is ordered to verify placement for internal jugular or subclavian cannulation.   The catheter tip is in the expected place and SVC.  EBL Minimal  Specimen(s) None     Marshell Garfinkel MD [facility] See Amion for pager  If no response to pager , please call [phone] until 7pm After 7:00 pm call Elink  [phone] 04-27-21, 12:52 AM

## 2021-05-05 NOTE — Consult Note (Addendum)
NAME:  [name], MRN:  [id number], DOB:  02/07/1946, LOS: 1 ADMISSION DATE:  03/23/2021, CONSULTATION DATE:  05/01/21 REFERRING MD:  Marzetta Board MD, CHIEF COMPLAINT: Altered mental status, shock  History of Present Illness:  75 year old with history of breast cancer, dementia, heavy alcohol use presenting with altered mental status, poor appetite.  Admitted for acute pancreatitis, AKI, A. fib with RVR, liver failure with hepatic encephalopathy.  Through the day she became increasingly unresponsive with coffee-ground emesis.  Transferred to the ICU this night for hypotension and PCCM called for help with management  She then had PEA cardiac arrest and underwent 20 minutes of CPR.  On arrival to the ICU CPR was in progress for about 5 minutes.  Intubated by anesthesia, intraosseous and central line placed after ROSC.  Pertinent  Medical History    has a past medical history of Allergy, Anxiety, Arthritis, Asthma, Breast cancer ([place]), Dementia ([place]), Depression, Hypertension, Hypertriglyceridemia, and Migraine (1980's).   Significant Hospital Events: Including procedures, antibiotic start and stop dates in addition to other pertinent events   10/1 admit, overnight became hypotensive, unresponsive.  Cardiac arrest on arrival to the ICU   Interim History / Subjective:    Objective   Blood pressure 99/77, pulse (!) 103, temperature (!) 97.5 F (36.4 C), temperature source Oral, resp. rate 16, height 5\' 4"  (1.626 m), weight 68 kg, SpO2 (!) 80 %.    Vent Mode: PRVC FiO2 (%):  [100 %] 100 % Set Rate:  [16 bmp] 16 bmp Vt Set:  [440 mL] 440 mL PEEP:  [8 cmH20] 8 cmH20 Plateau Pressure:  [36 cmH20] 36 cmH20   Intake/Output Summary (Last 24 hours) at May 01, 2021 0028 Last data filed at 04/04/2021 1923 Gross per 24 hour  Intake --  Output 151 ml  Net -151 ml   Filed Weights   04/02/2021 1343 04/02/2021 1520  Weight: 68 kg 68 kg    Examination: Blood pressure 99/77, pulse (!) 103,  temperature (!) 97.5 F (36.4 C), temperature source Oral, resp. rate 16, height 5\' 4"  (1.626 m), weight 68 kg, SpO2 (!) 80 %. Gen:      No acute distress HEENT:  EOMI, sclera anicteric Neck:     No masses; no thyromegaly Lungs:    Clear to auscultation bilaterally; normal respiratory effort CV:         Regular rate and rhythm; no murmurs Abd:      + bowel sounds; soft, non-tender; no palpable masses, no distension Ext:    No edema; adequate peripheral perfusion Skin:      Warm and dry; no rash Neuro: alert and oriented x 3 Psych: normal mood and affect   Resolved Hospital Problem list     Assessment & Plan:  Cardiac arrest Admitted with acute pancreatitis, AKI, atrial fibrillation Concern for GI bleed given drop in hemoglobin to 7.3 and coffee-ground emesis Likely has aspiration and severe ARDS  Low tidal volume ventilation Resuscitate with PRBC, PPI drip, octreotide GI consult Supportive care for pancreatitis Keep n.p.o. for now Broad antibiotic coverage Pressors if required    Best Practice (right click and "Reselect all SmartList Selections" daily)   Diet/type: NPO DVT prophylaxis: SCD GI prophylaxis: PPI Lines: Central line Foley:  N/A Code Status:  full code Last date of multidisciplinary goals of care discussion []   Labs   CBC: Recent Labs  Lab 03/30/2021 1551 04/04/21 0942 04/04/21 2355  WBC 7.9 7.0 3.3*  NEUTROABS 6.6  --  PENDING  HGB 12.4 12.9 7.8*  HCT 38.1 38.6 23.7*  MCV 112.7* 109.3* 112.3*  PLT 159 131* 57*    Basic Metabolic Panel: Recent Labs  Lab 03/17/2021 1551 04/04/21 0942  NA 140 139  K 4.9 3.7  CL 98 103  CO2 22 23  GLUCOSE 263* 232*  BUN 25* 29*  CREATININE 2.17* 2.05*  CALCIUM 7.9* 7.3*   GFR: Estimated Creatinine Clearance: 22.1 mL/min (A) (by C-G formula based on SCr of 2.05 mg/dL (H)). Recent Labs  Lab 03/23/2021 1551 03/30/2021 1951 04/04/21 0942 04/04/21 2355  WBC 7.9  --  7.0 3.3*  LATICACIDVEN 6.6* 3.5*  --   --      Liver Function Tests: Recent Labs  Lab 04/02/2021 1551 04/04/21 0942  AST 204* 173*  ALT 120* 104*  ALKPHOS 315* 287*  BILITOT 5.7* 4.7*  PROT 5.6* 5.2*  ALBUMIN 2.5* 2.2*   Recent Labs  Lab 04/04/21 0122  LIPASE 1,403*   Recent Labs  Lab 04/01/2021 1951 04/04/21 0942  AMMONIA 63* 54*    ABG    Component Value Date/Time   PHART 7.256 (L) 04/04/2021 2350   PCO2ART 63.6 (H) 04/04/2021 2350   PO2ART 75.7 (L) 04/04/2021 2350   HCO3 27.3 04/04/2021 2350   TCO2 35.8 09/11/2015 1100   O2SAT 88.6 04/04/2021 2350     Coagulation Profile: Recent Labs  Lab 03/08/2021 2030  INR 1.2    Cardiac Enzymes: No results for input(s): CKTOTAL, CKMB, CKMBINDEX, TROPONINI in the last 168 hours.  HbA1C: Hemoglobin A1C  Date/Time Value Ref Range Status  12/14/2017 05:19 PM 6.2 (A) 4.0 - 5.6 % Final   Hgb A1c MFr Bld  Date/Time Value Ref Range Status  04/04/2021 01:22 AM 5.9 (H) 4.8 - 5.6 % Final    Comment:    REPEATED TO VERIFY (NOTE) Pre diabetes:          5.7%-6.4%  Diabetes:              >6.4%  Glycemic control for   <7.0% adults with diabetes   11/03/2016 09:06 AM 5.8 (H) 4.8 - 5.6 % Final    Comment:             Pre-diabetes: 5.7 - 6.4          Diabetes: >6.4          Glycemic control for adults with diabetes: <7.0     CBG: Recent Labs  Lab 04/04/21 1652 04/04/21 2237 04/04/21 2341 04/04/21 2349 2021/05/04 0021  GLUCAP 164* 113* 50* 82 192*    Review of Systems:   Unable to obtain  Past Medical History:  She,  has a past medical history of Allergy, Anxiety, Arthritis, Asthma, Breast cancer ([place]), Dementia ([place]), Depression, Hypertension, Hypertriglyceridemia, and Migraine (1980's).   Surgical History:   Past Surgical History:  Procedure Laterality Date   ABDOMINAL HYSTERECTOMY  1981   "fibroids"   ANKLE FRACTURE SURGERY Left 03/15/2000   with hardware   APPLICATION OF WOUND VAC  01/17/2014   w/chest wall debridement   APPLICATION OF WOUND VAC  Left 01/17/2014   Procedure: PLACEMENT OF VAC DRESSING;  Surgeon: Merrie Roof, MD;  Location: [facility];  Service: General;  Laterality: Left;   BREAST SURGERY Left    removed due to BCA   COLONOSCOPY     JOINT REPLACEMENT     MASTECTOMY COMPLETE / SIMPLE W/ SENTINEL NODE BIOPSY Left    MASTECTOMY W/ SENTINEL NODE BIOPSY Left 12/20/2013  Procedure: MASTECTOMY WITH SENTINEL LYMPH NODE BIOPSY;  Surgeon: Merrie Roof, MD;  Location: [facility];  Service: General;  Laterality: Left;   ORIF SHOULDER FRACTURE Left [place] Right    TOTAL HIP ARTHROPLASTY Right    TOTAL HIP ARTHROPLASTY Left    TUBAL LIGATION  1973   WOUND DEBRIDEMENT Left 01/17/2014   Procedure: DEBRIDEMENT OF CHEST WALL;  Surgeon: Merrie Roof, MD;  Location: [facility];  Service: General;  Laterality: Left;     Social History:   reports that she quit smoking about 20 years ago. Her smoking use included cigarettes. She has a 20.00 pack-year smoking history. She has never used smokeless tobacco. She reports current alcohol use. She reports current drug use. Drug: Marijuana.   Family History:  Her family history includes Alcohol abuse in her father; Asthma in her father and mother; Stroke in her mother and sister. There is no history of Colon cancer.   Allergies Allergies  Allergen Reactions   Ace Inhibitors Other (See Comments)    Acute renal failure   Angiotensin Receptor Blockers Other (See Comments)    Acute Renal Failure     Home Medications  Prior to Admission medications   Medication Sig Start Date End Date Taking? Authorizing Provider  acetaminophen-codeine (TYLENOL #3) 300-30 MG tablet Take by mouth as needed. 03/04/20   [provider]  aspirin EC 81 MG tablet Take 81 mg by mouth daily. Swallow whole. Patient not taking: Reported on 03/29/2021    [provider]  metoprolol succinate (TOPROL-XL) 25 MG 24 hr tablet Take 1 tablet (25 mg total) by mouth in the morning. Patient not  taking: Reported on 03/28/2021 04/24/20   Rex Kras, DO     Critical care time:    The patient is critically ill with multiple organ system failure and requires high complexity decision making for assessment and support, frequent evaluation and titration of therapies, advanced monitoring, review of radiographic studies and interpretation of complex data.   Critical Care Time devoted to patient care services, exclusive of separately billable procedures, described in this note is 45 minutes.   Marshell Garfinkel MD [facility] See Amion for pager  If no response to pager , please call [phone] until 7pm After 7:00 pm call Elink  [phone] 04-29-21, 1:03 AM

## 2021-05-05 NOTE — Progress Notes (Signed)
Rapid Response Event Note   Reason for Call : pt SBP 60's  Initial Focused Assessment: Pt lethargic, initial SBP 49/39(44) retake SBP 68/47(54), pt able to follow simple commands. Pt responsive to name.  Interventions: CBG complete.  NS bolus infusing,  SBP 86/54(61) on RLE.  ABG and Labs ordered.  TRIAD, NP called to bedside.  Retake BP  95/70 (78). See Flowsheet.   Plan of Care:  Pt transferring to ICU for higher level of care.   Event Summary:   MD Notified: yes  Call Time: [place] Time: 2245 End Time: 2317  Dyann Ruddle, RN

## 2021-05-05 DEATH — deceased
